# Patient Record
Sex: Female | Born: 1935 | Race: Black or African American | Hispanic: No | Smoking: Never smoker
Health system: Southern US, Community
[De-identification: ages and names within clinical notes are randomized; demographics above are authoritative.]

## PROBLEM LIST (undated history)

## (undated) DIAGNOSIS — K219 Gastro-esophageal reflux disease without esophagitis: Secondary | ICD-10-CM

## (undated) DIAGNOSIS — D649 Anemia, unspecified: Secondary | ICD-10-CM

## (undated) DIAGNOSIS — F329 Major depressive disorder, single episode, unspecified: Secondary | ICD-10-CM

## (undated) DIAGNOSIS — N183 Chronic kidney disease, stage 3 unspecified: Secondary | ICD-10-CM

## (undated) DIAGNOSIS — F32A Depression, unspecified: Secondary | ICD-10-CM

## (undated) DIAGNOSIS — R8281 Pyuria: Secondary | ICD-10-CM

## (undated) DIAGNOSIS — M1990 Unspecified osteoarthritis, unspecified site: Secondary | ICD-10-CM

## (undated) DIAGNOSIS — R609 Edema, unspecified: Secondary | ICD-10-CM

## (undated) HISTORY — DX: Edema, unspecified: R60.9

## (undated) HISTORY — DX: Unspecified osteoarthritis, unspecified site: M19.90

## (undated) HISTORY — DX: Depression, unspecified: F32.A

## (undated) HISTORY — DX: Anemia, unspecified: D64.9

## (undated) HISTORY — DX: Pyuria: R82.81

## (undated) HISTORY — DX: Gastro-esophageal reflux disease without esophagitis: K21.9

## (undated) HISTORY — PX: NO PAST SURGERIES: SHX2092

## (undated) HISTORY — DX: Major depressive disorder, single episode, unspecified: F32.9

---

## 2011-10-11 ENCOUNTER — Inpatient Hospital Stay: Payer: Self-pay | Admitting: Internal Medicine

## 2011-10-11 LAB — CK TOTAL AND CKMB (NOT AT ARMC)
CK, Total: 283 U/L — ABNORMAL HIGH (ref 21–215)
CK-MB: 0.6 ng/mL (ref 0.5–3.6)

## 2011-10-11 LAB — CBC
HCT: 40.2 % (ref 35.0–47.0)
HGB: 12.7 g/dL (ref 12.0–16.0)
MCH: 31.5 pg (ref 26.0–34.0)
MCHC: 31.6 g/dL — ABNORMAL LOW (ref 32.0–36.0)
MCV: 100 fL (ref 80–100)
Platelet: 260 10*3/uL (ref 150–440)
RDW: 15.1 % — ABNORMAL HIGH (ref 11.5–14.5)

## 2011-10-11 LAB — COMPREHENSIVE METABOLIC PANEL
Alkaline Phosphatase: 63 U/L (ref 50–136)
BUN: 89 mg/dL — ABNORMAL HIGH (ref 7–18)
Bilirubin,Total: 1.8 mg/dL — ABNORMAL HIGH (ref 0.2–1.0)
Chloride: 116 mmol/L — ABNORMAL HIGH (ref 98–107)
Co2: 27 mmol/L (ref 21–32)
Creatinine: 1.29 mg/dL (ref 0.60–1.30)
EGFR (African American): 50 — ABNORMAL LOW
EGFR (Non-African Amer.): 42 — ABNORMAL LOW
Osmolality: 337 (ref 275–301)
SGOT(AST): 41 U/L — ABNORMAL HIGH (ref 15–37)
SGPT (ALT): 19 U/L
Sodium: 156 mmol/L — ABNORMAL HIGH (ref 136–145)
Total Protein: 8.5 g/dL — ABNORMAL HIGH (ref 6.4–8.2)

## 2011-10-12 LAB — BASIC METABOLIC PANEL
Anion Gap: 13 (ref 7–16)
BUN: 78 mg/dL — ABNORMAL HIGH (ref 7–18)
Calcium, Total: 9 mg/dL (ref 8.5–10.1)
Chloride: 119 mmol/L — ABNORMAL HIGH (ref 98–107)
Co2: 26 mmol/L (ref 21–32)
EGFR (African American): >60
Osmolality: 338 (ref 275–301)
Potassium: 3.8 mmol/L (ref 3.5–5.1)

## 2011-10-12 LAB — URINALYSIS, COMPLETE
Bilirubin,UR: NEGATIVE
Glucose,UR: NEGATIVE mg/dL (ref 0–75)
Nitrite: NEGATIVE
Ph: 8 (ref 4.5–8.0)
RBC,UR: 13 /HPF (ref 0–5)
Squamous Epithelial: 1
WBC UR: 68 /HPF (ref 0–5)

## 2011-10-12 LAB — CK TOTAL AND CKMB (NOT AT ARMC)
CK, Total: 229 U/L — ABNORMAL HIGH (ref 21–215)
CK-MB: 1 ng/mL (ref 0.5–3.6)

## 2011-10-12 LAB — TROPONIN I: Troponin-I: 0.07 ng/mL — ABNORMAL HIGH

## 2011-10-13 LAB — BASIC METABOLIC PANEL
Anion Gap: 8 (ref 7–16)
BUN: 56 mg/dL — ABNORMAL HIGH (ref 7–18)
Calcium, Total: 8.8 mg/dL (ref 8.5–10.1)
Chloride: 109 mmol/L — ABNORMAL HIGH (ref 98–107)
EGFR (Non-African Amer.): >60
Glucose: 137 mg/dL — ABNORMAL HIGH (ref 65–99)
Osmolality: 306 (ref 275–301)
Potassium: 3.1 mmol/L — ABNORMAL LOW (ref 3.5–5.1)
Sodium: 145 mmol/L (ref 136–145)

## 2011-10-14 LAB — BASIC METABOLIC PANEL
Anion Gap: 9 (ref 7–16)
Calcium, Total: 8.8 mg/dL (ref 8.5–10.1)
Chloride: 109 mmol/L — ABNORMAL HIGH (ref 98–107)
Co2: 29 mmol/L (ref 21–32)
Creatinine: 0.81 mg/dL (ref 0.60–1.30)
Glucose: 105 mg/dL — ABNORMAL HIGH (ref 65–99)
Osmolality: 301 (ref 275–301)

## 2011-10-14 LAB — CBC WITH DIFFERENTIAL/PLATELET
Basophil %: 0.1 %
Eosinophil %: 1.6 %
HCT: 28.2 % — ABNORMAL LOW (ref 35.0–47.0)
Lymphocyte #: 1.9 10*3/uL (ref 1.0–3.6)
MCHC: 32 g/dL (ref 32.0–36.0)
MCV: 100 fL (ref 80–100)
Monocyte #: 1.1 10*3/uL — ABNORMAL HIGH (ref 0.0–0.7)
Monocyte %: 10.7 %
Neutrophil #: 7.1 10*3/uL — ABNORMAL HIGH (ref 1.4–6.5)
Neutrophil %: 69.1 %
RBC: 2.84 10*6/uL — ABNORMAL LOW (ref 3.80–5.20)
WBC: 10.3 10*3/uL (ref 3.6–11.0)

## 2011-10-14 LAB — FERRITIN: Ferritin (ARMC): 415 ng/mL — ABNORMAL HIGH (ref 8–388)

## 2011-10-14 LAB — IRON AND TIBC: Iron Saturation: 41 %

## 2011-10-15 LAB — CBC WITH DIFFERENTIAL/PLATELET
Basophil #: 0 10*3/uL (ref 0.0–0.1)
Basophil %: 0.5 %
HCT: 26.6 % — ABNORMAL LOW (ref 35.0–47.0)
HGB: 8.6 g/dL — ABNORMAL LOW (ref 12.0–16.0)
Lymphocyte %: 15.9 %
MCH: 31.8 pg (ref 26.0–34.0)
MCHC: 32.2 g/dL (ref 32.0–36.0)
MCV: 99 fL (ref 80–100)
Monocyte #: 1.1 10*3/uL — ABNORMAL HIGH (ref 0.0–0.7)
Neutrophil #: 5.8 10*3/uL (ref 1.4–6.5)
Neutrophil %: 69.4 %
RDW: 15.6 % — ABNORMAL HIGH (ref 11.5–14.5)

## 2011-10-16 ENCOUNTER — Encounter: Payer: Self-pay | Admitting: Internal Medicine

## 2011-10-29 ENCOUNTER — Ambulatory Visit: Payer: Self-pay | Admitting: Internal Medicine

## 2011-11-08 ENCOUNTER — Encounter: Payer: Self-pay | Admitting: Internal Medicine

## 2011-11-14 LAB — URINALYSIS, COMPLETE
Bilirubin,UR: NEGATIVE
Leukocyte Esterase: NEGATIVE
Nitrite: NEGATIVE
Protein: NEGATIVE
RBC,UR: <1 /HPF (ref 0–5)
Squamous Epithelial: 1

## 2011-11-16 LAB — BASIC METABOLIC PANEL
BUN: 16 mg/dL (ref 7–18)
Calcium, Total: 8.7 mg/dL (ref 8.5–10.1)
Chloride: 106 mmol/L (ref 98–107)
EGFR (African American): >60
EGFR (Non-African Amer.): >60
Potassium: 3.5 mmol/L (ref 3.5–5.1)
Sodium: 144 mmol/L (ref 136–145)

## 2011-12-08 ENCOUNTER — Encounter: Payer: Self-pay | Admitting: Internal Medicine

## 2012-02-03 ENCOUNTER — Ambulatory Visit: Payer: Self-pay | Admitting: Oncology

## 2012-02-03 LAB — CBC CANCER CENTER
Basophil #: 0 x10 3/mm (ref 0.0–0.1)
Basophil %: 0.7 %
Eosinophil #: 0.2 x10 3/mm (ref 0.0–0.7)
Eosinophil %: 4.4 %
HGB: 9.4 g/dL — ABNORMAL LOW (ref 12.0–16.0)
Lymphocyte #: 1.7 x10 3/mm (ref 1.0–3.6)
MCH: 29.7 pg (ref 26.0–34.0)
MCV: 90 fL (ref 80–100)
Monocyte %: 11.6 %
Neutrophil #: 3 x10 3/mm (ref 1.4–6.5)
Neutrophil %: 53.2 %
RBC: 3.16 10*6/uL — ABNORMAL LOW (ref 3.80–5.20)
RDW: 14.9 % — ABNORMAL HIGH (ref 11.5–14.5)
WBC: 5.6 x10 3/mm (ref 3.6–11.0)

## 2012-02-03 LAB — IRON AND TIBC
Iron Bind.Cap.(Total): 443 ug/dL (ref 250–450)
Iron Saturation: 7 %
Unbound Iron-Bind.Cap.: 415 ug/dL

## 2012-02-03 LAB — LACTATE DEHYDROGENASE: LDH: 211 U/L (ref 84–246)

## 2012-02-03 LAB — FOLATE: Folic Acid: 41.8 ng/mL (ref 3.1–100.0)

## 2012-02-03 LAB — FERRITIN: Ferritin (ARMC): 14 ng/mL (ref 8–388)

## 2012-02-07 ENCOUNTER — Ambulatory Visit: Payer: Self-pay | Admitting: Oncology

## 2012-03-09 ENCOUNTER — Ambulatory Visit: Payer: Self-pay | Admitting: Oncology

## 2012-04-17 ENCOUNTER — Inpatient Hospital Stay: Payer: Self-pay | Admitting: Internal Medicine

## 2012-04-17 LAB — CBC WITH DIFFERENTIAL/PLATELET
Basophil #: 0 10*3/uL (ref 0.0–0.1)
Eosinophil #: 0.1 10*3/uL (ref 0.0–0.7)
HCT: 32.3 % — ABNORMAL LOW (ref 35.0–47.0)
Lymphocyte #: 0.8 10*3/uL — ABNORMAL LOW (ref 1.0–3.6)
Lymphocyte %: 10 %
Monocyte #: 0.7 x10 3/mm (ref 0.2–0.9)
Monocyte %: 9.2 %
Neutrophil #: 6.4 10*3/uL (ref 1.4–6.5)
Neutrophil %: 78.5 %
Platelet: 335 10*3/uL (ref 150–440)
RBC: 3.47 10*6/uL — ABNORMAL LOW (ref 3.80–5.20)
RDW: 17.8 % — ABNORMAL HIGH (ref 11.5–14.5)
WBC: 8.1 10*3/uL (ref 3.6–11.0)

## 2012-04-17 LAB — COMPREHENSIVE METABOLIC PANEL
Albumin: 3.5 g/dL (ref 3.4–5.0)
Alkaline Phosphatase: 95 U/L (ref 50–136)
Calcium, Total: 9.4 mg/dL (ref 8.5–10.1)
Co2: 29 mmol/L (ref 21–32)
Glucose: 116 mg/dL — ABNORMAL HIGH (ref 65–99)
SGPT (ALT): 21 U/L (ref 12–78)
Sodium: 138 mmol/L (ref 136–145)
Total Protein: 8.7 g/dL — ABNORMAL HIGH (ref 6.4–8.2)

## 2012-04-17 LAB — URINALYSIS, COMPLETE
Bilirubin,UR: NEGATIVE
Ketone: NEGATIVE
Protein: NEGATIVE
RBC,UR: 25 /HPF (ref 0–5)
WBC UR: 623 /HPF (ref 0–5)

## 2012-04-17 LAB — PRO B NATRIURETIC PEPTIDE: B-Type Natriuretic Peptide: 423 pg/mL (ref 0–450)

## 2012-04-17 LAB — PROTIME-INR
INR: 1.1
Prothrombin Time: 14.1 secs (ref 11.5–14.7)

## 2012-04-17 LAB — APTT: Activated PTT: 33.7 secs (ref 23.6–35.9)

## 2012-04-18 LAB — BASIC METABOLIC PANEL
Calcium, Total: 8.8 mg/dL (ref 8.5–10.1)
Chloride: 104 mmol/L (ref 98–107)
Creatinine: 0.87 mg/dL (ref 0.60–1.30)
EGFR (Non-African Amer.): 60 — ABNORMAL LOW
Glucose: 100 mg/dL — ABNORMAL HIGH (ref 65–99)
Potassium: 3.4 mmol/L — ABNORMAL LOW (ref 3.5–5.1)
Sodium: 141 mmol/L (ref 136–145)

## 2012-04-18 LAB — CBC WITH DIFFERENTIAL/PLATELET
Basophil #: 0 10*3/uL (ref 0.0–0.1)
Basophil %: 0.5 %
Eosinophil #: 0.3 10*3/uL (ref 0.0–0.7)
HCT: 28 % — ABNORMAL LOW (ref 35.0–47.0)
HGB: 9.3 g/dL — ABNORMAL LOW (ref 12.0–16.0)
Lymphocyte %: 22.5 %
MCH: 30.7 pg (ref 26.0–34.0)
MCHC: 33.4 g/dL (ref 32.0–36.0)
MCV: 92 fL (ref 80–100)
Monocyte %: 10.8 %
RBC: 3.04 10*6/uL — ABNORMAL LOW (ref 3.80–5.20)
WBC: 7.1 10*3/uL (ref 3.6–11.0)

## 2012-04-19 LAB — CBC WITH DIFFERENTIAL/PLATELET
Basophil #: 0 10*3/uL (ref 0.0–0.1)
Basophil %: 0.6 %
Eosinophil #: 0.3 10*3/uL (ref 0.0–0.7)
Eosinophil %: 4.6 %
HCT: 26.1 % — ABNORMAL LOW (ref 35.0–47.0)
HGB: 8.7 g/dL — ABNORMAL LOW (ref 12.0–16.0)
Lymphocyte #: 1.5 10*3/uL (ref 1.0–3.6)
Lymphocyte %: 24.5 %
MCHC: 33.2 g/dL (ref 32.0–36.0)
MCV: 92 fL (ref 80–100)
Monocyte %: 11.8 %
Neutrophil #: 3.5 10*3/uL (ref 1.4–6.5)
RBC: 2.84 10*6/uL — ABNORMAL LOW (ref 3.80–5.20)
WBC: 6 10*3/uL (ref 3.6–11.0)

## 2012-04-19 LAB — BASIC METABOLIC PANEL
Anion Gap: 7 (ref 7–16)
Calcium, Total: 8.2 mg/dL — ABNORMAL LOW (ref 8.5–10.1)
Chloride: 107 mmol/L (ref 98–107)
Creatinine: 0.73 mg/dL (ref 0.60–1.30)
EGFR (African American): >60
EGFR (Non-African Amer.): >60
Glucose: 86 mg/dL (ref 65–99)
Osmolality: 285 (ref 275–301)
Sodium: 143 mmol/L (ref 136–145)

## 2012-04-19 LAB — URINE CULTURE

## 2012-04-20 LAB — CBC WITH DIFFERENTIAL/PLATELET
Basophil #: 0 x10 3/mm 3
Basophil %: 0.4 %
Eosinophil #: 0.2 x10 3/mm 3
Eosinophil %: 2.1 %
HCT: 27.7 % — ABNORMAL LOW
HGB: 9.2 g/dL — ABNORMAL LOW
Lymphocyte %: 19.6 %
Lymphs Abs: 1.5 x10 3/mm 3
MCH: 30.7 pg
MCHC: 33.1 g/dL
MCV: 93 fL
Monocyte #: 0.7 "x10 3/mm "
Monocyte %: 9.3 %
Neutrophil #: 5.1 x10 3/mm 3
Neutrophil %: 68.6 %
Platelet: 328 x10 3/mm 3
RBC: 2.99 X10 6/mm 3 — ABNORMAL LOW
RDW: 17.9 % — ABNORMAL HIGH
WBC: 7.5 x10 3/mm 3

## 2012-04-20 LAB — BASIC METABOLIC PANEL WITH GFR
Anion Gap: 8
BUN: 11 mg/dL
Calcium, Total: 8 mg/dL — ABNORMAL LOW
Chloride: 106 mmol/L
Co2: 29 mmol/L
Creatinine: 0.84 mg/dL
EGFR (African American): >60
EGFR (Non-African Amer.): >60
Glucose: 101 mg/dL — ABNORMAL HIGH
Osmolality: 285
Potassium: 3.4 mmol/L — ABNORMAL LOW
Sodium: 143 mmol/L

## 2012-04-20 LAB — TSH: Thyroid Stimulating Horm: 5.41 u[IU]/mL — ABNORMAL HIGH

## 2012-04-21 LAB — BASIC METABOLIC PANEL
Anion Gap: 5 — ABNORMAL LOW (ref 7–16)
BUN: 14 mg/dL (ref 7–18)
Calcium, Total: 8.2 mg/dL — ABNORMAL LOW (ref 8.5–10.1)
Creatinine: 0.95 mg/dL (ref 0.60–1.30)
EGFR (African American): >60
EGFR (Non-African Amer.): 54 — ABNORMAL LOW
Glucose: 120 mg/dL — ABNORMAL HIGH (ref 65–99)
Osmolality: 287 (ref 275–301)
Potassium: 3.7 mmol/L (ref 3.5–5.1)
Sodium: 143 mmol/L (ref 136–145)

## 2012-04-21 LAB — CBC WITH DIFFERENTIAL/PLATELET
Basophil #: 0 10*3/uL (ref 0.0–0.1)
Eosinophil #: 0.3 10*3/uL (ref 0.0–0.7)
HCT: 27.1 % — ABNORMAL LOW (ref 35.0–47.0)
HGB: 9.1 g/dL — ABNORMAL LOW (ref 12.0–16.0)
Lymphocyte #: 2 10*3/uL (ref 1.0–3.6)
Lymphocyte %: 26.9 %
MCH: 31.1 pg (ref 26.0–34.0)
MCV: 92 fL (ref 80–100)
Monocyte %: 11.2 %
Platelet: 327 10*3/uL (ref 150–440)
RDW: 17.9 % — ABNORMAL HIGH (ref 11.5–14.5)

## 2012-04-23 LAB — CULTURE, BLOOD (SINGLE)

## 2012-06-30 ENCOUNTER — Ambulatory Visit: Payer: Self-pay

## 2013-01-27 ENCOUNTER — Emergency Department: Payer: Self-pay | Admitting: Emergency Medicine

## 2013-01-27 LAB — CBC
HCT: 29.8 % — ABNORMAL LOW (ref 35.0–47.0)
HGB: 10.3 g/dL — ABNORMAL LOW (ref 12.0–16.0)
MCH: 33.4 pg (ref 26.0–34.0)
MCV: 97 fL (ref 80–100)
Platelet: 221 10*3/uL (ref 150–440)
RBC: 3.08 10*6/uL — ABNORMAL LOW (ref 3.80–5.20)
RDW: 14.8 % — ABNORMAL HIGH (ref 11.5–14.5)
WBC: 5.3 10*3/uL (ref 3.6–11.0)

## 2014-01-14 IMAGING — CR DG CHEST 1V PORT
1 series · 1 of 1 positions shown · non-contrast
Comparison: none

REASON FOR EXAM: weakness
COMMENTS:

PROCEDURE:     DXR - DXR PORTABLE CHEST SINGLE VIEW  - October 11, 2011  [DATE]
RESULT:     Comparison: None.

[portable]
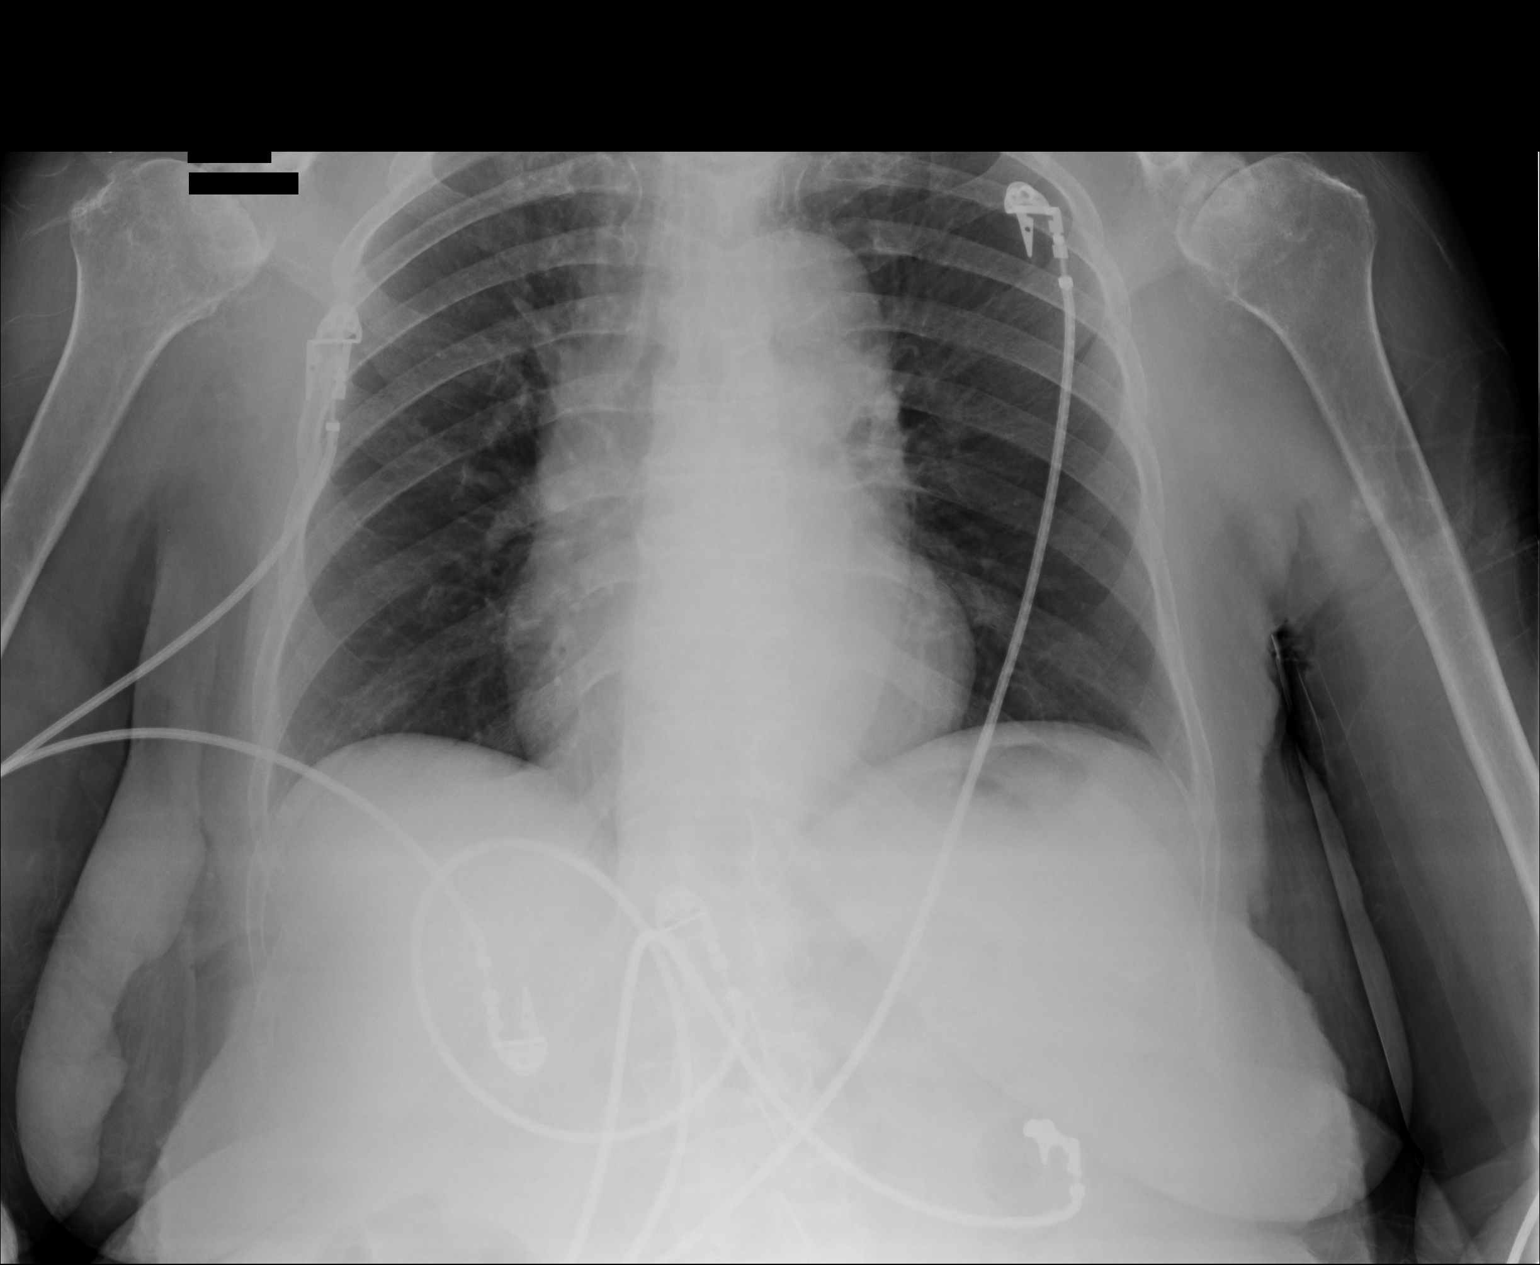

[1 of 1 positions shown; findings below may reference images not displayed]

FINDINGS: The heart is normal in size. The aorta is mildly tortuous. No focal
pulmonary opacities. There are degenerative changes in the shoulders.
IMPRESSION: No acute cardiopulmonary disease.

## 2014-03-23 DIAGNOSIS — M1990 Unspecified osteoarthritis, unspecified site: Secondary | ICD-10-CM | POA: Insufficient documentation

## 2014-03-23 DIAGNOSIS — N183 Chronic kidney disease, stage 3 unspecified: Secondary | ICD-10-CM | POA: Insufficient documentation

## 2014-03-23 DIAGNOSIS — D649 Anemia, unspecified: Secondary | ICD-10-CM | POA: Insufficient documentation

## 2014-11-26 NOTE — Discharge Summary (Signed)
PATIENT NAME:  Heather Zuniga, Heather Zuniga MR#:  [id number] DATE OF BIRTH:  18-May-1923  DATE OF ADMISSION:  04/17/2012 DATE OF DISCHARGE:  Not stated  ADDENDUM:  She was not discharged on 09/12 secondary to not having a bed for her as the family has not contacted the hospital to tell them what facility they wanted. She has been stable. The only change in her medication is that she went to once-a-day Septra instead of twice a day. We will keep the Foley until she can clearly have a bed pan all the time or at least have the urine off of her legs causing some of the decubitus there.   Please note if her hemoglobin does not respond to p.o. iron given her iron deficiency, she should see hematology for further IV iron.   ____________________________ Marya Amsler. Dareen Piano, MD mwa:bjt D: 04/21/2012 07:23:05 ET T: 04/21/2012 07:49:54 ET JOB#: [id number]  cc: Marya Amsler. Dareen Piano, MD, <Dictator> Lauro Regulus MD ELECTRONICALLY SIGNED 04/23/2012 19:53

## 2014-11-26 NOTE — Consult Note (Signed)
PATIENT NAME:  Heather Zuniga, Heather Zuniga MR#:  [id number] DATE OF BIRTH:  03/04/1923  DATE OF CONSULTATION:  04/19/2012  REFERRING PHYSICIAN:   CONSULTING PHYSICIAN:  Verdie Shire, MD/Luane Rochon Ruthe Mannan, NP   ATTENDING PHYSICIAN: Dr. Tressia Miners   PRIMARY CARE PHYSICIAN: Dr. Ouida Sills   REASON FOR CONSULTATION: Anemia, iron deficiency.   HISTORY OF PRESENT ILLNESS: Ms. Wellen is an 81 year old African American female who presented to the Emergency Room for the concern of weakness, falling, and increased frequency with urination. She was diagnosed with a urinary tract infection. History is obtained from the history and physical on admission as well as from patient. No family members are present. It is noted on the patient's medical history compulsive holding history and possible dementia, thus, difficult historian at times. She states that she has been suffering from increased frequency to urinate. Unfortunately, she has not been making it to the bathroom in time, thus she states she is "sitting in the urine". Significant for skin breakdown to buttocks as well as back of her thighs. She denies any abdominal pain. No rectal bleeding. No melena. No nausea. No vomiting. No fevers. No heartburn. She denies history of colonoscopy and upper endoscopy performed in the past. It is noted under past medical history that she has a history of anemia.   PAST MEDICAL HISTORY:  1. History of iron deficiency anemia.  2. Compulsive holding history.  3. Possible dementia.   PAST SURGICAL HISTORY: Unremarkable.   ALLERGIES: None.   HOME MEDICATIONS:  1. Tylenol 500 mg daily. 2. Aspirin 81 mg daily. 3. Colace 100 mg twice a day. 4. Iron 150 mg daily.  5. Lasix 20 mg a day. 6. Prilosec 20 mg a day. 7. One multivitamin a day.   SOCIAL HISTORY: Resides in assisted living facility. No tobacco. No alcohol use.   FAMILY HISTORY: Both parents deceased in their 46's, unknown underlying etiological causes.   REVIEW OF SYSTEMS:  Review of systems obtained from the history and physical again as well as patient. CONSTITUTIONAL: No fevers. Significant for fatigue, weakness. EYES: No blurred vision or double vision, though it is noted that she wears glasses. HEENT: No tinnitus, hearing pain, hearing loss, epistaxis. RESPIRATORY: No coughing, wheezing, hemoptysis. CARDIOVASCULAR: No chest pain, palpitations, syncope. GI: See history of present illness. GU: Significant for increased frequency with urination, incontinence. ENDOCRINE: No thyroid problems, heat or cold intolerance. HEMATOLOGICAL: Known history of iron deficiency anemia. Denies significant for easy bruising and bleeding. SKIN: No acne but significant for lesions, open sores, skin breakdown to the buttocks as well as posterior aspect of thighs. MUSCULOSKELETAL: No history of gout or arthritis. NEUROLOGIC: No history of TIA or CVA, seizure disorder. PSYCH: No depression. No anxiety.   PHYSICAL EXAMINATION:   VITAL SIGNS: Temperature 98.4, pulse 80, respirations 20, blood pressure 145/67, pulse oximetry 94%.   GENERAL: Well developed, well nourished 81 year old African American female in no acute distress noted. Resting comfortably on the left side in her bed.   HEENT: Normocephalic, atraumatic. Pupils equal, round, and reactive light. Conjunctivae clear. Sclerae anicteric.   NECK: Supple. Trachea midline. No lymphadenopathy or thyromegaly.   PULMONARY: Symmetric rise and fall of chest. Clear to auscultation throughout.   CARDIOVASCULAR: Regular rhythm, S1, S2.   ABDOMEN: Soft, nondistended. Bowel sounds in four quadrants. No bruits. No masses.   RECTAL: Evidence of induration, skin breakdown, multiple open lesions noted. Significant for oozing. Noted buttocks as well as posterior aspect of thigh.   MUSCULOSKELETAL: No contractions.  EXTREMITIES: +2 edema.   PSYCH: Alert and oriented x3. Flat affect. Memory loss. Otherwise, no gross neurological deficits.    LABORATORY, DIAGNOSTIC, AND RADIOLOGICAL DATA: Glucose 116 on admission. BUN was elevated at 25. EGFR low at 54, otherwise within normal limits. Today chemistry panel within normal limits except calcium has declined from 9.4 to 8.2. Hepatic panel on admission total protein 8.7, otherwise within normal limits. CBC on admission white count was 8.1, hemoglobin 10.8, hematocrit 32.3, has dropped to 8.7 hemoglobin today with hematocrit of 26.1. PT 14.1 on admission with an INR of 1.1. Urine culture positive for greater than 100,000 CFU/mL of Escherichia coli. Blood cultures revealed no growth in 36 hours. Urinalysis on admission +1 blood, +3 leukocytes, RBCs 25 per high-power field with WBC of 623 per high-power field, 3+ bacteria, epithelial cells 15 per high-power field, white blood cell clumps present, and hyaline casts 8 per low power field.   EKG showed sinus rhythm with possible PACs with aberrant conduction. Last chest x-ray, single view, cannot exclude possible mild congestive heart failure.   IMPRESSION:  1. Urinary tract infection. 2. Generalized weakness. 3. Skin breakdown noted buttocks as well as posterior aspect of thighs.  4. Known history of iron deficiency anemia with noted drop in hemoglobin and hematocrit since admission.   PLAN: The patient's presentation was discussed with Dr. Verdie Shire. Did discuss with the patient the consideration of proceeding forward with endoscopic evaluation for the indication of iron deficiency anemia. The patient declines proceeding forward with a colonoscopy and/or an upper endoscopy for the indication of iron deficiency anemia. The patient wishes to have her condition monitored at this time. Will continue to follow. Otherwise, no further recommendations at this time.   These services provided by Payton Emerald, NP under collaborative agreement with Verdie Shire, MD.   ____________________________ Payton Emerald, NP dsh:drc D: 04/19/2012 17:17:42  ET T: 04/20/2012 08:50:59 ET JOB#: [id number]  cc: Payton Emerald, NP, <Dictator> Payton Emerald MD ELECTRONICALLY SIGNED 04/26/2012 8:20

## 2014-11-26 NOTE — Consult Note (Signed)
Brief Consult Note: Diagnosis: UTI, Falls, Generalized Weakness. Known history of IDA with noted drop in H/H since admission.   Discussed with Attending MD.   Comments: Patient's presentation discussed with Dr. Lutricia Feil.  Discussed with patient of the recommendation to proceed with endoscopic evaluation inclusive of diagnostic colonoscopy and/or EGD for the indication of IDA.  Patient declines proceeding forward in this manner.  Will continue to monitor.  Electronic Signatures: Rodman Key (NP)  (Signed 11-Sep-13 17:19)  Authored: Brief Consult Note   Last Updated: 11-Sep-13 17:19 by Rodman Key (NP)

## 2014-11-26 NOTE — Discharge Summary (Signed)
PATIENT NAME:  JOYEL, Heather Zuniga MR#:  [id number] DATE OF BIRTH:  01-Feb-1923  DATE OF ADMISSION:  04/17/2012 DATE OF DISCHARGE:  04/20/2012   DISCHARGE DIAGNOSES:  1. Encephalopathy secondary to urinary tract infection.  2. Urinary tract infection.  3. Iron deficiency anemia, declining work-up.  4. Edema, fairly marked on admission. 5. Possible early dementia.  6. Decubiti developed at assisted living, is unable to care for herself well there per report.   DISCHARGE MEDICATIONS:  1. Tylenol 650 mg q.4 hours p.r.n.  2. Ferrous sulfate 325 mg daily.  3. Multivitamin daily.  4. Prilosec 20 mg daily.   5. Torsemide 10 mg daily.  6. Potassium chloride 10 mEq daily.  7. Septra DS one p.o. b.i.d. for five days. 8. Zinc oxide cream or other barrier cream on the superficial decubiti on her legs and buttocks twice a day and/or as needed.   HISTORY AND PHYSICAL: Please see detailed history and physical done on admission.   HOSPITAL COURSE: The patient was admitted with confusion, urinary tract infection ultimately growing out Escherichia coli sensitive to the sulfa as well as multiple other things as noted. She had a minimally elevated TSH which was likely sick euthyroid, does not need to be treated. She continued to have anemia with history of this being iron deficient. She got IV iron once at the [facility], unclear if she had gotten her second and/or third dose so this will be given IV today. She will stay on the p.o. iron as well. We will stop her aspirin. GI was consulted and the patient adamantly declined work-up of this. I discussed potential colon cancer, stomach cancer, etc. with her today. She understands and still does not want this done secondary to her age she says. She does understand the risk of death and severe pain, malaise from missing metastatic cancer. She has a bed offer from multiple places today and will decide which one she wants. Will attempt to get her discharged there and work  further with physical therapy as she continues to improve from her urinary tract infection, etc.   TIME SPENT: It took approximately 35 minutes to do discharge tasks today.  ____________________________ Marya Amsler. Dareen Piano, MD mwa:drc D: 04/20/2012 08:00:08 ET T: 04/20/2012 08:20:16 ET JOB#: [id number]  cc: Marya Amsler. Dareen Piano, MD, <Dictator> Lauro Regulus MD ELECTRONICALLY SIGNED 04/20/2012 8:45

## 2014-11-26 NOTE — Consult Note (Signed)
Pt seen. See Dawn Harrison's notes. Pt made it clear that she does not want any endoscopic evaluations for her Fe def anemia. Recommend Fe supplement or blood transfusions prn if hgb falls significantly. Will sign off. THanks.  Electronic Signatures: Lutricia Feil (MD)  (Signed on 11-Sep-13 18:50)  Authored  Last Updated: 11-Sep-13 18:50 by Lutricia Feil (MD)

## 2014-11-26 NOTE — H&P (Signed)
PATIENT NAME:  Heather Zuniga, Heather Zuniga MR#:  [id number] DATE OF BIRTH:  1922/12/20  DATE OF ADMISSION:  04/17/2012  ADMITTING PHYSICIAN: Dr. Enid Baas PRIMARY CARE PHYSICIAN: Dr. Einar Crow  CHIEF COMPLAINT: Weakness, fall, and increased frequency of urination.   HISTORY OF PRESENT ILLNESS: Heather Zuniga is an 81 year old African American female with past medical significant for anemia and possible dementia, who is from an assisted living facility, brought in for the above-mentioned complaints. The patient is a very poor historian and unable to provide much history other than saying that she has been generally weak, has some diarrhea for the last couple of days, and had a fall secondary to weakness. She is found to have increased frequency of urination and labs indicate that she has a urinary tract infection so she is being admitted for the same.   PAST MEDICAL HISTORY: 1. Anemia.  2. Compulsive holding history.  3. Possible dementia.   PAST SURGICAL HISTORY: None.   ALLERGIES TO MEDICATIONS: No known drug allergies.   MEDICATIONS AT HOME:  1. Tylenol 500 mg p.o. daily.  2. Aspirin 81 mg p.o. daily.  3. Colace 100 mg p.o. b.i.d.  4. Iron 150 mg daily. 5. Lasix 20 daily.  6. Prilosec 20 mg p.o. daily. 7. Multivitamin 1 tablet p.o. daily.   SOCIAL HISTORY: Currently from assisted living facility.  No smoking or alcohol use.   FAMILY HISTORY: The patient says both parents died in their 57s. She is not sure what they had.  REVIEW OF SYSTEMS: CONSTITUTIONAL: No fever, fatigue, or weakness. EYES: No blurred vision, double vision, inflammation, or glaucoma. ENT: No tinnitus, ear pain, hearing loss, epistaxis, or discharge.  RESPIRATORY:  No cough, wheezing, hemoptysis, or  chronic obstructive pulmonary disease. CARDIOVASCULAR: No chest pain, orthopnea, edema, arrhythmia, palpitations, or syncope. GI:  Positive for some diarrhea. No nausea, vomiting, abdominal pain, or hematemesis.  Complains of black stools but she is on iron supplements. GU: Increased frequency of urination. No dysuria or hematuria. ENDOCRINE: No polyuria, nocturia, thyroid problems, or heat or cold intolerance. HEMATOLOGIC: No anemia, easy bruising or bleeding. SKIN: No acne, rash, or lesions. MUSCULOSKELETAL: No neck, back, shoulder pain, arthritis, or gout. Generally feels weak. NEUROLOGIC: No cerebrovascular accident, transient ischemic attack, or seizures. PSYCH: No anxiety, insomnia, or depression.   PHYSICAL EXAMINATION:  VITAL SIGNS: Temperature 97.4 degrees Fahrenheit, pulse 84, respirations 18, blood pressure 142/65, pulse oximetry 95 percent on room air.   GENERAL: Well built, well nourished female sitting in bed, not in any acute distress.   HEENT: Normocephalic, atraumatic. Pupils equal, round, reacting to light. Anicteric sclerae. Extraocular movements intact. Oropharynx clear without exudate or masses.  NECK:  Supple.  No thyromegaly, jugular venous distention, or carotid bruits.  No lymphadenopathy.  LUNGS: Moving air bilaterally. No wheeze or crackles.  No use of accessory muscles for breathing.   CARDIOVASCULAR: S1, S2 regular rate and rhythm. No murmurs, rubs, or gallops.   ABDOMEN: Soft, nontender, nondistended. No hepatosplenomegaly. Normal bowel sounds.   EXTREMITIES:  Bilateral 3+ nonpitting edema which is chronic. Unable to palpate any dorsalis pedis pulse.   LYMPH:  No cervical lymphadenopathy. NEUROLOGICAL: Cranial nerves intact. No focal motor or sensory deficit.  PSYCH:  The patient is awake, alert, and oriented times two.  LABORATORY, RADIOLOGICAL, AND DIAGNOSTIC DATA: WBC 8.1, hemoglobin 10.8, hematocrit 32.3, platelet count 335.    Sodium 138, potassium 4, chloride 102, bicarbonate 29, BUN 25, creatinine 1.07, glucose 116, calcium 9.4.  ALT 21, AST 34,  alkaline phosphatase 95, total bili 0.6, albumin 3.5. INR 1.1, PTT 33.7. Lipase 79. BNP is 423. Urinalysis is yellow,  turbid. 3+ leukocyte esterase, 600 WBCs, 3+ bacteria.   Chest x-ray showing mild interstitial vascular prominence. Cannot rule out mild congestive heart failure.   ASSESSMENT AND PLAN:  81 year old female with anemia, dementia, chronic bilateral lower extremity edema admitted after a fall and found to have urinary tract infection. 1. Fall: Likely from weakness and urinary tract infection. We will get physical therapy consult. 2. Urinary tract infection:  Check urine and blood cultures. Started on Levaquin for now.  3. Chronic lower extremity edema: With urinary tract infection we will hold the Lasix at this time. It does not sound like she is overloaded at this time.  4. Anemia: On iron supplements. 5. GI and deep vein thrombosis prophylaxis: She is on Prilosec and TEDs. 6. CODE STATUS: FULL CODE.  TIME SPENT ON ADMISSION: 50 minutes.   ____________________________ Enid Baas, MD rk:bjt D: 04/17/2012 21:30:41 ET T: 04/18/2012 07:12:52 ET JOB#: [id number]  cc: Enid Baas, MD, <Dictator> Marya Amsler. Dareen Piano, MD Enid Baas MD ELECTRONICALLY SIGNED 04/18/2012 13:29

## 2014-12-01 NOTE — H&P (Signed)
PATIENT NAME:  Heather Zuniga, Heather Zuniga MR#:  [id number] DATE OF BIRTH:  01/02/1923  DATE OF ADMISSION:  10/11/2011  PRIMARY CARE PHYSICIAN: None.   CHIEF COMPLAINT: Fall.   HISTORY OF PRESENT ILLNESS:  The patient comes to the Emergency Room after supposedly she fell, stayed on the floor for three days, called the neighbor help and the [facility] Department went to her place. The patient has history of compulsive hoarding where she collects junk in her house. The police department found her covered with urine and feces and in a very filthy condition. She was brought to the Emergency Room where she was found very dehydrated  clinically and sodium was 156. She is being admitted for further evaluation and management. There was no apparent injury or fracture noted. Per EMTs, the patient was found lying on the floor for a few days, unable to eat and drink, had urinated and defecated on herself. The EMS unable to clear trash to get the patient out of the door. Hence, they had to remove the patient via the window. The patient is being admitted for further evaluation and management.   PAST MEDICAL HISTORY: None.   PAST SURGICAL HISTORY: None.   MEDICATIONS:  1. Aspirin.  2. Calcium.  3. Centrum Silver.   ALLERGIES: No known drug allergies.   FAMILY HISTORY: The patient does not know family history about her father and mother.   SOCIAL HISTORY: She lives alone. Has symptoms of compulsive hoarding. She is retired. She denies any smoking or alcohol. The patient does have a foster daughter. Her name is Heather Zuniga.   REVIEW OF SYSTEMS: CONSTITUTIONAL: Positive for fatigue, weakness. EYES: No blurred or double vision. ENT: No tinnitus, ear pain, hearing loss. RESPIRATORY: No cough, wheeze, hemoptysis. CARDIOVASCULAR: No chest pain, orthopnea, or edema. GASTROINTESTINAL: No nausea, vomiting, diarrhea, or abdominal pain. GU: No dysuria or hematuria. ENDOCRINE: No polyuria or nocturia. HEMATOLOGY: No anemia or  easy bruising. SKIN: No acne or rash, appears wrinkled. MUSCULOSKELETAL: Positive for muscle cramps in her calf. No cerebrovascular accident or transient ischemic attack. PSYCH: No anxiety or depression. All other systems reviewed and negative.   PHYSICAL EXAMINATION:  GENERAL: The patient is awake, alert, and oriented x3, not in acute distress.   VITAL SIGNS: Afebrile, pulse 91, blood pressure is 123/58, sats 100% on room air.   HEENT: Atraumatic, normocephalic. Pupils are equal, round, and reactive to light and accommodation. Extraocular movements intact. Oral mucosa is dry with chapped lips.   NECK: Supple. No JVD. No carotid bruit.   LUNGS: Clear to auscultation bilaterally. No rales, rhonchi, respiratory distress, or labored breathing.   CARDIOVASCULAR: Tachycardia present. Both the heart sounds are normal. Rhythm is regular, rate is tachycardic. No murmur heard. PMI is not lateralized. Chest nontender.   EXTREMITIES: Feeble pedal pulses, good femoral pulses. No lower extremity edema.   ABDOMEN: Soft, benign, and nontender. No organomegaly. Positive bowel sounds.   NEUROLOGIC: Grossly intact cranial nerves II through XII. The patient is globally weak. No motor or sensory deficit. The patient is an extremely dehydrated and very weak.   SKIN: Warm and dry.   LABORATORY, DIAGNOSTIC, AND RADIOLOGICAL DATA: EKG shows sinus tachycardia. Chest x-ray: No acute cardiopulmonary disease. CK total is 283, MB 0.6. Troponin is 0.08. White count 14.1, hemoglobin and hematocrit 12.7 and 40.2, platelet count 260. Glucose 117, BUN 89, creatinine 1.29, sodium 156, potassium 4.4, chloride 116, bicarbonate 27, calcium 10, bilirubin total 1.8. LFTs: SGPT 19, SGOT 41, total protein 8.5, albumin  3.7.   ASSESSMENT: 81 year old Heather Zuniga with:  1. Severe dehydration due to poor p.o. intake for 3 to 4 days.  2. Hyponatremia/hypochloremia due to #1.  3. Falls at home. The patient thinks she was down on the  floor since Saturday which is three days. Denies any injury other than muscle cramps in her calf muscles. [employer] was informed by neighbors. Given her collection of junk in the house, the patient was removed from the window. 4. The patient has habit of hoarding, likely a compulsive obsession for many years.  5. Elevated troponin without chest pain and negative EKG.   PLAN:  1. Admit the patient to medical floor.  2. Regular diet.  3. Will start the patient on D5 water at 125 an hour. Follow-up Met B.  4. Continue aspirin daily.  5. We will start the patient on some multivitamins.  6. Heparin for deep vein thrombosis prophylaxis.  7. Cycle cardiac enzymes x3.  8. Care management for discharge planning. The patient needs to have a safe discharge plan given her symptoms of hoarding, living alone and falls at home. DSS needs to be notified as well.  9. We will have physical therapy see the patient as well for gait ambulation and training.  10. Further work-up according to the patient's clinical course. The hospital admission plan was discussed with the patient and her daughter, who, per the patient, is a foster daughter and was agreeable to it. The patient is a FULL CODE for now.   TIME SPENT: 50 minutes.   ____________________________ Heather Rochester Posey Pronto, MD sap:ap D: 10/11/2011 23:29:10 ET T: 10/12/2011 07:00:15 ET JOB#: [id number]  cc: Heather Zuniga A. Posey Pronto, MD, <Dictator> Heather Basset MD ELECTRONICALLY SIGNED 10/13/2011 6:26

## 2014-12-01 NOTE — Discharge Summary (Signed)
PATIENT NAME:  Heather Zuniga, Heather Zuniga MR#:  [id number] DATE OF BIRTH:  10-29-1922  DATE OF ADMISSION:  10/11/2011 DATE OF DISCHARGE:  10/15/2011   ADMITTING DIAGNOSIS: Status post fall.   DISCHARGE DIAGNOSES: 1. Status post fall due to cluttering in the house.  2. Severe dehydration due to lack of p.o. intake due to fall and inability to get up.  3. Severe hypernatremia, hyperchloremia due to dehydration, now resolved with IV fluids.  4. Urinary tract infection, on p.o. Cipro.  5. Constipation, now better.  6. Anemia likely due to anemia of chronic disease. May consider outpatient colonoscopy.  7. Slightly elevated troponin in setting of a fall without any EKG changes to suggest ischemia.  8. Compulsive hoarding behavior.  9. Likely early dementia, type unknown.   PERTINENT LABORATORY EVALUATIONS: Troponin 0.08. WBC 14.1, hemoglobin 12.7, platelet count 260. BMP glucose 117, BUN 89, creatinine 1.29, sodium 156, potassium 4.4, chloride 116, bilirubin total 1.8, alkaline phosphatase 63, ALT 19, AST 41.   EKG sinus tachycardia, nonspecific ST-T wave changes. Chest x-ray showed no acute cardiopulmonary processes. Urinalysis showed 3+ bacteria, leukocyte esterase 1+. B12 level was 1224. Iron level was 91. Iron binding 220. Ferritin was 415. Chest x-ray showed no acute cardiopulmonary processes.   HOSPITAL COURSE: Please see history and physical done by the admitting physician. The patient is an 81 year old African American female with no previous medical history who lives in her home alone but has been hoarding newspapers and other things over the past few years. Apparently she fell and could not get up for three days. Her neighbor's daughter called EMS when she went to give her the newspaper that was left outside. When EMS arrived, they noticed her on the floor awake. They noticed lots of cluttering in the house. She was brought to the ED and noted to have a sodium of 156. Creatinine was slightly elevated.  She was very dehydrated. She was admitted for further evaluation and treatment. The patient was treated with IV fluids with resolution of her hypernatremia and her acute renal failure. The patient also was noted to have a urinary tract infection which was treated with antibiotics. She is felt to be very deconditioned and needs further rehab/nursing home placement which is being arranged. The patient also had a normal hemoglobin on admission, however, with hydration her hemoglobin did drop but likely as a result of her being very dehydrated on presentation. The patient's iron, ferritin, and B12 levels were all normal. She is doing much better and is stable for discharge to skilled nursing facility.   DISCHARGE MEDICATIONS:  1. Aspirin 81 one tab p.o. daily.  2. Multivitamin 1 tab p.o. daily.  3. Cipro 500 p.o. q.12 hours x2 days. 4. Colace 100 p.o. b.i.d.  5. Senna 1 to 2 tabs p.o. q.8 p.r.n. constipation.   DIET: Regular.   ACTIVITY: As tolerated.   REFERRAL: Rehab/skilled nursing facility with PT and OT.   TIMEFRAME FOR FOLLOW-UP: [facility] MD next available in 1 to 2 weeks as a new patient.   TIME SPENT: 35 minutes.  ____________________________ Lacie Scotts Allena Katz, MD shp:drc D: 10/15/2011 11:33:48 ET T: 10/15/2011 11:41:36 ET JOB#: [id number]  cc: Mckinsey Keagle H. Allena Katz, MD, <Dictator> Charise Carwin MD ELECTRONICALLY SIGNED 10/23/2011 13:12

## 2015-03-27 ENCOUNTER — Other Ambulatory Visit: Payer: Self-pay | Admitting: Family Medicine

## 2015-05-06 ENCOUNTER — Other Ambulatory Visit: Payer: Self-pay | Admitting: Family Medicine

## 2015-05-06 NOTE — Telephone Encounter (Signed)
Patient is requesting refill on Latanoprost 0.005%. Patient last seen on 11/25/2014 by Amy.

## 2015-06-09 ENCOUNTER — Ambulatory Visit (INDEPENDENT_AMBULATORY_CARE_PROVIDER_SITE_OTHER): Payer: Medicare Other | Admitting: Family Medicine

## 2015-06-09 ENCOUNTER — Telehealth: Payer: Self-pay | Admitting: Family Medicine

## 2015-06-09 ENCOUNTER — Ambulatory Visit: Payer: Self-pay | Admitting: Family Medicine

## 2015-06-09 ENCOUNTER — Encounter: Payer: Self-pay | Admitting: Family Medicine

## 2015-06-09 ENCOUNTER — Other Ambulatory Visit: Payer: Self-pay | Admitting: Family Medicine

## 2015-06-09 VITALS — BP 111/76 | HR 73 | Temp 97.8°F | Resp 16 | Ht 60.0 in | Wt 213.4 lb

## 2015-06-09 DIAGNOSIS — M1990 Unspecified osteoarthritis, unspecified site: Secondary | ICD-10-CM | POA: Diagnosis not present

## 2015-06-09 DIAGNOSIS — L97909 Non-pressure chronic ulcer of unspecified part of unspecified lower leg with unspecified severity: Secondary | ICD-10-CM

## 2015-06-09 DIAGNOSIS — K219 Gastro-esophageal reflux disease without esophagitis: Secondary | ICD-10-CM | POA: Diagnosis not present

## 2015-06-09 DIAGNOSIS — I83029 Varicose veins of left lower extremity with ulcer of unspecified site: Secondary | ICD-10-CM | POA: Diagnosis not present

## 2015-06-09 DIAGNOSIS — R609 Edema, unspecified: Secondary | ICD-10-CM

## 2015-06-09 DIAGNOSIS — E669 Obesity, unspecified: Secondary | ICD-10-CM | POA: Diagnosis not present

## 2015-06-09 DIAGNOSIS — I83009 Varicose veins of unspecified lower extremity with ulcer of unspecified site: Secondary | ICD-10-CM

## 2015-06-09 DIAGNOSIS — L97929 Non-pressure chronic ulcer of unspecified part of left lower leg with unspecified severity: Principal | ICD-10-CM

## 2015-06-09 MED ORDER — DOXYCYCLINE HYCLATE 100 MG PO TABS: 100.0000 mg | ORAL_TABLET | Freq: Two times a day (BID) | ORAL | Status: DC

## 2015-06-09 NOTE — Telephone Encounter (Signed)
Called [facility] to f/u on scheduling wound clinic appt for Ms. Heather Zuniga. Shelia from [facility] called but was told she had to schedule with Pam. The 1st available appt is Tues 11/8. I stressed the importance of scheduling this appt as Dr. Juanetta Gosling wanted something sooner. Spoke with Pam who was going to find out who could schedule this appt.

## 2015-06-09 NOTE — Patient Instructions (Signed)
To get flu shot next week at [facility].  To get appt. To wound care ASAP.  Cont. All other current meds.Marland Kitchen

## 2015-06-09 NOTE — Progress Notes (Signed)
Name: Heather Zuniga   MRN: [id number]    DOB: April 12, 1923   Date:06/09/2015       Progress Note  Subjective  Chief Complaint  Chief Complaint  Patient presents with  . Leg Injury    HPI Resident at [facility].  Wearing dressings on lower legs x many months.  C/o L. Leg swelling and hurting more for past few days.  Leg oozing also.  No fever.  No problem-specific assessment & plan notes found for this encounter.   Past Medical History  Diagnosis Date  . GERD (gastroesophageal reflux disease)   . Anemia   . Arthritis   . Pyuria   . Edema   . Depression     Social History  Substance Use Topics  . Smoking status: Never Smoker   . Smokeless tobacco: Never Used  . Alcohol Use: No     Current outpatient prescriptions:  .  Acetaminophen (TYLENOL ARTHRITIS EXT RELIEF PO), Take 2 tablets by mouth 2 (two) times daily., Disp: , Rfl:  .  ferrous sulfate 325 (65 FE) MG tablet, TAKE 1 TABLET BY MOUTH ONCE DAILY, Disp: 30 tablet, Rfl: 11 .  omeprazole (PRILOSEC) 20 MG capsule, Take 20 mg by mouth daily. , Disp: , Rfl:  .  Polyethyl Glycol-Propyl Glycol (SYSTANE) 0.4-0.3 % SOLN, Apply 1 drop to eye 2 (two) times daily., Disp: , Rfl:  .  potassium chloride (K-DUR,KLOR-CON) 10 MEQ tablet, Take 10 mEq by mouth daily. , Disp: , Rfl:  .  spironolactone (ALDACTONE) 25 MG tablet, Take 25 mg by mouth daily. , Disp: , Rfl:  .  torsemide (DEMADEX) 20 MG tablet, , Disp: , Rfl:  .  aspirin EC 81 MG tablet, Take by mouth., Disp: , Rfl:   No Known Allergies  Review of Systems  Constitutional: Negative for fever, chills, weight loss and malaise/fatigue.  Respiratory: Negative for cough, shortness of breath and wheezing.   Cardiovascular: Positive for leg swelling (Bilateral leg swelling with L>R.  R leg wrapped.  L. leg oozing.and painful). Negative for chest pain and palpitations.  Gastrointestinal: Negative for heartburn, abdominal pain and blood in stool.  Genitourinary: Negative for dysuria,  urgency and frequency.  Musculoskeletal: Negative for myalgias and joint pain.  Skin: Negative for rash.  Neurological: Negative for weakness.      Objective  Filed Vitals:   06/09/15 0829  BP: 111/76  Pulse: 73  Temp: 97.8 F (36.6 C)  Resp: 16  Height: 5' (1.524 m)  Weight: 213 lb 6.4 oz (96.798 kg)     Physical Exam  Constitutional: She is well-developed, well-nourished, and in no distress. No distress.  HENT:  Head: Normocephalic and atraumatic.  Musculoskeletal: She exhibits edema.  L lower leg wrapped with sterile 4x4s and kling and an ACEW wrap until Wound care can start care.  Skin:  Raw area post L. Lower leg with clear oozing serum.  Tender to palpation.  Some redness and warmth.  Leg with 3 + edema.      No results found for this or any previous visit (from the past 2160 hour(s)).   Assessment & Plan  1. Stasis leg ulcer, left (HCC)  - doxycycline (VIBRA-TABS) 100 MG tablet; Take 1 tablet (100 mg total) by mouth 2 (two) times daily. TAke with food  Dispense: 20 tablet; Refill: 0 - Ambulatory referral to Wound Clinic  2. Gastroesophageal reflux disease without esophagitis   3. Obesity   4. Arthritis   5. Edema, unspecified type

## 2015-06-11 ENCOUNTER — Other Ambulatory Visit: Payer: Self-pay | Admitting: Family Medicine

## 2015-06-11 ENCOUNTER — Telehealth: Payer: Self-pay | Admitting: *Deleted

## 2015-06-11 DIAGNOSIS — L97909 Non-pressure chronic ulcer of unspecified part of unspecified lower leg with unspecified severity: Principal | ICD-10-CM

## 2015-06-11 DIAGNOSIS — I83009 Varicose veins of unspecified lower extremity with ulcer of unspecified site: Secondary | ICD-10-CM

## 2015-06-11 DIAGNOSIS — E669 Obesity, unspecified: Secondary | ICD-10-CM

## 2015-06-11 DIAGNOSIS — L97929 Non-pressure chronic ulcer of unspecified part of left lower leg with unspecified severity: Secondary | ICD-10-CM

## 2015-06-11 NOTE — Telephone Encounter (Signed)
Yes, order Home Health Nurse evaluation for dressing changes daily to legs with early ulcer L calf.  She is scheduled for wound center next week.

## 2015-06-11 NOTE — Telephone Encounter (Signed)
Daphne from [facility] called and they are requesting an order for home health. Per Director at their facility they are not able to change wound dressing as complex as the patient's. They report that it is oozing and skin falling off. Please advise.

## 2015-06-13 NOTE — Telephone Encounter (Signed)
Called [facility] to f/u on order for hh nurse eval. Spoke to BJ who says she has been admitted into their services.

## 2015-06-17 ENCOUNTER — Encounter: Payer: Medicare Other | Attending: Surgery | Admitting: Surgery

## 2015-06-17 ENCOUNTER — Ambulatory Visit: Payer: Self-pay | Admitting: Family Medicine

## 2015-06-17 DIAGNOSIS — M1990 Unspecified osteoarthritis, unspecified site: Secondary | ICD-10-CM | POA: Diagnosis not present

## 2015-06-17 DIAGNOSIS — I509 Heart failure, unspecified: Secondary | ICD-10-CM | POA: Insufficient documentation

## 2015-06-17 DIAGNOSIS — K219 Gastro-esophageal reflux disease without esophagitis: Secondary | ICD-10-CM | POA: Diagnosis not present

## 2015-06-17 DIAGNOSIS — N183 Chronic kidney disease, stage 3 (moderate): Secondary | ICD-10-CM | POA: Insufficient documentation

## 2015-06-17 DIAGNOSIS — F329 Major depressive disorder, single episode, unspecified: Secondary | ICD-10-CM | POA: Diagnosis not present

## 2015-06-17 DIAGNOSIS — D649 Anemia, unspecified: Secondary | ICD-10-CM | POA: Insufficient documentation

## 2015-06-17 DIAGNOSIS — I890 Lymphedema, not elsewhere classified: Secondary | ICD-10-CM | POA: Insufficient documentation

## 2015-06-17 DIAGNOSIS — H5441 Blindness, right eye, normal vision left eye: Secondary | ICD-10-CM | POA: Diagnosis not present

## 2015-06-17 DIAGNOSIS — L97222 Non-pressure chronic ulcer of left calf with fat layer exposed: Secondary | ICD-10-CM | POA: Insufficient documentation

## 2015-06-18 NOTE — Progress Notes (Signed)
LYNZE, REDDY ([id number]) Visit Report for 06/17/2015 Chief Complaint Document Details Patient Name: Heather Zuniga, Heather Zuniga Date of Service: 06/17/2015 8:00 AM Medical Record Number: [id number] Patient Account Number: [id number] Date of Birth/Sex: 1923/02/02 (81 y.o. Female) Treating RN: Curtis Sites Primary Care Physician: Bjorn Pippin Other Clinician: Referring Physician: Fidel Levy Treating Physician/Extender: Rudene Re in Treatment: 0 Information Obtained from: Patient Chief Complaint Patient seen for complaints of Non-Healing Wound. this 81 year old patient has had bilateral lower extremity swelling for over 3 years and now has had an open ulceration on the posterior part of the left leg for several months. Electronic Signature(s) Signed: 06/17/2015 9:27:06 AM By: Evlyn Kanner MD, FACS Entered By: Evlyn Kanner on 06/17/2015 09:27:06 BREUNNA, NORDMANN ([id number]) -------------------------------------------------------------------------------- HPI Details Patient Name: Heather Zuniga Date of Service: 06/17/2015 8:00 AM Medical Record Number: [id number] Patient Account Number: [id number] Date of Birth/Sex: 1922-11-20 (81 y.o. Female) Treating RN: Curtis Sites Primary Care Physician: Bjorn Pippin Other Clinician: Referring Physician: Fidel Levy Treating Physician/Extender: Rudene Re in Treatment: 0 History of Present Illness Location: ulcerated area on the left posterior leg. She also has bilateral lower extremity swelling Quality: Patient reports experiencing a dull pain to affected area(s). Severity: Patient states wound are getting worse. Duration: Patient has had the wound for > 3 months prior to seeking treatment at the wound center Timing: Pain in wound is Intermittent (comes and goes Context: The wound appeared gradually over time Modifying Factors: Other treatment(s) tried include: she has had doxycycline and a compression ace wrap applied Associated  Signs and Symptoms: Patient reports having increase swelling. HPI Description: 81 year old patient sent by her PCP Venora Maples for a stasis ulcer on her left lower leg which she's had for many months. As indicated to him recently with leg swelling and oozing. A past medical history significant for GERD, chronic kidney disease stage III,anemia, arthritis, pyuria, edema and depression. She has never been a smoker. On clinical examination he found that she had a left lower leg ulceration with oozing and was tender to palpation with some redness and +3 edema. He put her on doxycycline and referred her to the wound clinic. Electronic Signature(s) Signed: 06/17/2015 9:28:08 AM By: Evlyn Kanner MD, FACS Previous Signature: 06/17/2015 8:55:04 AM Version By: Evlyn Kanner MD, FACS Previous Signature: 06/17/2015 8:42:55 AM Version By: Evlyn Kanner MD, FACS Previous Signature: 06/17/2015 8:36:32 AM Version By: Evlyn Kanner MD, FACS Entered By: Evlyn Kanner on 06/17/2015 09:28:07 Heather Zuniga, Heather Zuniga ([id number]) -------------------------------------------------------------------------------- Physical Exam Details Patient Name: Heather Zuniga Date of Service: 06/17/2015 8:00 AM Medical Record Number: [id number] Patient Account Number: [id number] Date of Birth/Sex: April 07, 1923 (81 y.o. Female) Treating RN: Curtis Sites Primary Care Physician: Bjorn Pippin Other Clinician: Referring Physician: Fidel Levy Treating Physician/Extender: Rudene Re in Treatment: 0 Constitutional . Pulse regular. Respirations normal and unlabored. Afebrile. . Eyes Nonicteric. Reactive to light. Ears, Nose, Mouth, and Throat Lips, teeth, and gums WNL.Marland Kitchen Moist mucosa without lesions . Neck supple and nontender. No palpable supraclavicular or cervical adenopathy. Normal sized without goiter. Respiratory WNL. No retractions.. Cardiovascular Pedal Pulses WNL. ABI on the left was 1.03 on the right was 0.92. she has  bilateral stage II lymphedema and there is a large weeping open ulcer on the left posterior leg. Gastrointestinal (GI) Abdomen without masses or tenderness.. No liver or spleen enlargement or tenderness.. Lymphatic No adneopathy. No adenopathy. No adenopathy. Musculoskeletal Adexa without tenderness or enlargement.. Digits and nails w/o clubbing, cyanosis, infection, petechiae, ischemia, or inflammatory conditions.. Integumentary (Hair,  Skin) No suspicious lesions. No crepitus or fluctuance. No peri-wound warmth or erythema. No masses.Marland Kitchen Psychiatric Judgement and insight Intact.. No evidence of depression, anxiety, or agitation.. Notes The patient has a large ulcerated area on the left lower extremity in the region of the calf with subcutaneous tissue exposed. there is no surrounding cellulitis but has significant swelling. Electronic Signature(s) Signed: 06/17/2015 9:29:35 AM By: Evlyn Kanner MD, FACS Entered By: Evlyn Kanner on 06/17/2015 09:29:34 Heather Zuniga, Heather Zuniga ([id number]) -------------------------------------------------------------------------------- Physician Orders Details Patient Name: Heather Zuniga Date of Service: 06/17/2015 8:00 AM Medical Record Number: [id number] Patient Account Number: [id number] Date of Birth/Sex: Apr 24, 1923 (81 y.o. Female) Treating RN: Curtis Sites Primary Care Physician: Bjorn Pippin Other Clinician: Referring Physician: Fidel Levy Treating Physician/Extender: Rudene Re in Treatment: 0 Verbal / Phone Orders: Yes Clinician: Curtis Sites Read Back and Verified: Yes Diagnosis Coding Wound Cleansing Wound #1 Left,Posterior Lower Leg o Clean wound with Normal Saline. Anesthetic Wound #1 Left,Posterior Lower Leg o Topical Lidocaine 4% cream applied to wound bed prior to debridement Primary Wound Dressing Wound #1 Left,Posterior Lower Leg o Aquacel Ag Secondary Dressing Wound #1 Left,Posterior Lower Leg o ABD  pad Dressing Change Frequency Wound #1 Left,Posterior Lower Leg o Change dressing every week - or as needed Follow-up Appointments Wound #1 Left,Posterior Lower Leg o Return Appointment in 1 week. Edema Control Wound #1 Left,Posterior Lower Leg o 3 Layer Compression System - Left Lower Extremity - Please anchor with unna at the top Home Health Wound #1 Left,Posterior Lower Leg o Continue Home Health Visits - HHRN may change wrap if wrap slips or is uncomfortable o Home Health Nurse may visit PRN to address patientos wound care needs. o FACE TO FACE ENCOUNTER: MEDICARE and MEDICAID PATIENTS: I certify that this patient is under my care and that I had a face-to-face encounter that meets the physician face-to-face encounter requirements with this patient on this date. The encounter with the patient was in [place], [place] ([id number]) whole or in part for the following MEDICAL CONDITION: (primary reason for Home Healthcare) MEDICAL NECESSITY: I certify, that based on my findings, NURSING services are a medically necessary home health service. HOME BOUND STATUS: I certify that my clinical findings support that this patient is homebound (i.e., Due to illness or injury, pt requires aid of supportive devices such as crutches, cane, wheelchairs, walkers, the use of special transportation or the assistance of another person to leave their place of residence. There is a normal inability to leave the home and doing so requires considerable and taxing effort. Other absences are for medical reasons / religious services and are infrequent or of short duration when for other reasons). o If current dressing causes regression in wound condition, may D/C ordered dressing product/s and apply Normal Saline Moist Dressing daily until next Wound Healing Center / Other MD appointment. Notify Wound Healing Center of regression in wound condition at [phone]. o Please direct any NON-WOUND  related issues/requests for orders to patient's Primary Care Physician Services and Therapies o Venous Studies -Bilateral oooo Electronic Signature(s) Signed: 06/17/2015 4:34:10 PM By: Evlyn Kanner MD, FACS Signed: 06/17/2015 5:19:50 PM By: Curtis Sites Entered By: Curtis Sites on 06/17/2015 09:22:29 Heather Zuniga ([id number]) -------------------------------------------------------------------------------- Problem List Details Patient Name: Heather Zuniga Date of Service: 06/17/2015 8:00 AM Medical Record Number: [id number] Patient Account Number: [id number] Date of Birth/Sex: 10-10-1922 (81 y.o. Female) Treating RN: Curtis Sites Primary Care Physician: Bjorn Pippin Other Clinician: Referring Physician: Fidel Levy Treating Physician/Extender: Rudene Re  in Treatment: 0 Active Problems ICD-10 Encounter Code Description Active Date Diagnosis I89.0 Lymphedema, not elsewhere classified 06/17/2015 Yes L97.222 Non-pressure chronic ulcer of left calf with fat layer 06/17/2015 Yes exposed E66.01 Morbid (severe) obesity due to excess calories 06/17/2015 Yes N18.3 Chronic kidney disease, stage 3 (moderate) 06/17/2015 Yes Inactive Problems Resolved Problems Electronic Signature(s) Signed: 06/17/2015 9:26:28 AM By: Evlyn Kanner MD, FACS Entered By: Evlyn Kanner on 06/17/2015 59:56:38 Heather Zuniga ([id number]) -------------------------------------------------------------------------------- Progress Note Details Patient Name: Heather Zuniga Date of Service: 06/17/2015 8:00 AM Medical Record Number: [id number] Patient Account Number: [id number] Date of Birth/Sex: [birth year] (81 y.o. Female) Treating RN: Curtis Sites Primary Care Physician: Bjorn Pippin Other Clinician: Referring Physician: Fidel Levy Treating Physician/Extender: Rudene Re in Treatment: 0 Subjective Chief Complaint Information obtained from Patient Patient seen for complaints of  Non-Healing Wound. this 81 year old patient has had bilateral lower extremity swelling for over 3 years and now has had an open ulceration on the posterior part of the left leg for several months. History of Present Illness (HPI) The following HPI elements were documented for the patient's wound: Location: ulcerated area on the left posterior leg. She also has bilateral lower extremity swelling Quality: Patient reports experiencing a dull pain to affected area(s). Severity: Patient states wound are getting worse. Duration: Patient has had the wound for > 3 months prior to seeking treatment at the wound center Timing: Pain in wound is Intermittent (comes and goes Context: The wound appeared gradually over time Modifying Factors: Other treatment(s) tried include: she has had doxycycline and a compression ace wrap applied Associated Signs and Symptoms: Patient reports having increase swelling. 81 year old patient sent by her PCP Venora Maples for a stasis ulcer on her left lower leg which she's had for many months. As indicated to him recently with leg swelling and oozing. A past medical history significant for GERD, chronic kidney disease stage III,anemia, arthritis, pyuria, edema and depression. She has never been a smoker. On clinical examination he found that she had a left lower leg ulceration with oozing and was tender to palpation with some redness and +3 edema. He put her on doxycycline and referred her to the wound clinic. Wound History Patient presents with 1 open wound that has been present for approximately 2-3 weeks. Patient has been treating wound in the following manner: gauze and ace wrap. Laboratory tests have not been performed in the last month. Patient reportedly has not tested positive for an antibiotic resistant organism. Patient reportedly has not tested positive for osteomyelitis. Patient reportedly has not had testing performed to evaluate circulation in the legs.  Patient experiences the following problems associated with their wounds: swelling. Patient History Information obtained from Patient, Caregiver. Allergies Dilks, Arnell ([id number]) No Known Allergies Family History No family history of Cancer, Diabetes, Heart Disease, Hereditary Spherocytosis, Hypertension, Kidney Disease, Lung Disease, Seizures, Stroke, Thyroid Problems, Tuberculosis. Social History Never smoker, Marital Status - Divorced, Alcohol Use - Never, Drug Use - No History, Caffeine Use - Never. Medical History Eyes Patient has history of Cataracts, Glaucoma Hematologic/Lymphatic Patient has history of Anemia - iron Cardiovascular Patient has history of Congestive Heart Failure Musculoskeletal Patient has history of Osteoarthritis Oncologic Denies history of Received Chemotherapy, Received Radiation Psychiatric Denies history of Anorexia/bulimia, Confinement Anxiety Hospitalization/Surgery History - 05/09/2012, [facility], wound on back. 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 Eyes The patient has no  complaints or symptoms. Ear/Nose/Mouth/Throat The patient has no complaints or symptoms. Hematologic/Lymphatic The patient has no complaints or symptoms. Cardiovascular Complains or has symptoms of LE edema. Gastrointestinal The patient has no complaints or symptoms. Endocrine The patient has no complaints or symptoms. Genitourinary Complains or has symptoms of Incontinence/dribbling. Immunological The patient has no complaints or symptoms. Heather Zuniga, Heather Zuniga ([id number]) Integumentary (Skin) Complains or has symptoms of Wounds - wound on lower back. Neurologic The patient has no complaints or symptoms. Oncologic The patient has no complaints or symptoms. Psychiatric The patient has no complaints or  symptoms. Medications Tylenol Arthritis Pain 650 mg tablet,extended release oral 2 2 tablet extended release oral Systane Ultra 0.4 %-0.3 % eye drops ophthalmic 1 1 drops ophthalmic ferrous sulfate 325 mg (65 mg iron) tablet oral 1 1 tablet oral torsemide 20 mg tablet oral 1 1 tablet oral Xalatan 0.005 % eye drops ophthalmic 1 1 drops ophthalmic Therems tablet oral 1 1 tablet oral potassium chloride ER 10 mEq tablet,extended release oral 1 1 tablet extended release oral spironolactone 25 mg tablet oral 1 1 tablet oral Prilosec OTC 20 mg tablet,delayed release oral 1 1 tablet,delayed release (DR/EC) oral doxycycline hyclate 100 mg tablet oral 1 1 tablet oral Objective Constitutional Pulse regular. Respirations normal and unlabored. Afebrile. Vitals Time Taken: 8:19 AM, Height: 60 in, Source: Stated, Weight: 213 lbs, Source: Stated, BMI: 41.6, Temperature: 97.7 F, Pulse: 63 bpm, Respiratory Rate: 16 breaths/min, Blood Pressure: 152/70 mmHg. Eyes Nonicteric. Reactive to light. Ears, Nose, Mouth, and Throat Lips, teeth, and gums WNL.Marland Kitchen Moist mucosa without lesions . Neck supple and nontender. No palpable supraclavicular or cervical adenopathy. Normal sized without goiter. Respiratory WNL. No retractions.Marland Kitchen CARLENE, BICKLEY ([id number]) Cardiovascular Pedal Pulses WNL. ABI on the left was 1.03 on the right was 0.92. she has bilateral stage II lymphedema and there is a large weeping open ulcer on the left posterior leg. Gastrointestinal (GI) Abdomen without masses or tenderness.. No liver or spleen enlargement or tenderness.. Lymphatic No adneopathy. No adenopathy. No adenopathy. Musculoskeletal Adexa without tenderness or enlargement.. Digits and nails w/o clubbing, cyanosis, infection, petechiae, ischemia, or inflammatory conditions.Marland Kitchen Psychiatric Judgement and insight Intact.. No evidence of depression, anxiety, or agitation.. General Notes: The patient has a large ulcerated area on the  left lower extremity in the region of the calf with subcutaneous tissue exposed. there is no surrounding cellulitis but has significant swelling. Integumentary (Hair, Skin) No suspicious lesions. No crepitus or fluctuance. No peri-wound warmth or erythema. No masses.. Wound #1 status is Open. Original cause of wound was Not Known. The wound is located on the Left,Posterior Lower Leg. The wound measures 5cm length x 9cm width x 0.1cm depth; 35.343cm^2 area and 3.534cm^3 volume. The wound is limited to skin breakdown. There is no tunneling or undermining noted. There is a large amount of serous drainage noted. The wound margin is flat and intact. There is large (67-100%) red granulation within the wound bed. There is a small (1-33%) amount of necrotic tissue within the wound bed including Adherent Slough. The periwound skin appearance exhibited: Dry/Scaly. The periwound skin appearance did not exhibit: Callus, Crepitus, Excoriation, Fluctuance, Friable, Induration, Localized Edema, Rash, Scarring, Maceration, Moist, Atrophie Blanche, Cyanosis, Ecchymosis, Hemosiderin Staining, Mottled, Pallor, Rubor, Erythema. The periwound has tenderness on palpation. Assessment Active Problems ICD-10 I89.0 - Lymphedema, not elsewhere classified L97.222 - Non-pressure chronic ulcer of left calf with fat layer exposed E66.01 - Morbid (severe) obesity due to excess calories N18.3 - Chronic kidney disease, stage 3 (  moderate) Heather Zuniga, Heather Zuniga ([id number]) This 81 year old patient who has the underlying problem of chronic kidney disease stage III has had bilateral lower extremity lymphedema and has had no workup for this. I would recommend venous duplex studies of both lower extremities. I have also recommended elevation of the limbs and have discussed this in detail both with the daughter, the patient and the caregiver at the assisted living facility. We will use silver alginate and a Profore lite wrap to start off  with. If she tolerates this well we may increase to a 4-layer Profore. She has been asked to come to see as an regular basis and all questions have been answered. Plan Wound Cleansing: Wound #1 Left,Posterior Lower Leg: Clean wound with Normal Saline. Anesthetic: Wound #1 Left,Posterior Lower Leg: Topical Lidocaine 4% cream applied to wound bed prior to debridement Primary Wound Dressing: Wound #1 Left,Posterior Lower Leg: Aquacel Ag Secondary Dressing: Wound #1 Left,Posterior Lower Leg: ABD pad Dressing Change Frequency: Wound #1 Left,Posterior Lower Leg: Change dressing every week - or as needed Follow-up Appointments: Wound #1 Left,Posterior Lower Leg: Return Appointment in 1 week. Edema Control: Wound #1 Left,Posterior Lower Leg: 3 Layer Compression System - Left Lower Extremity - Please anchor with unna at the top Home Health: Wound #1 Left,Posterior Lower Leg: Continue Home Health Visits - HHRN may change wrap if wrap slips or is uncomfortable Home Health Nurse may visit PRN to address patient s wound care needs. FACE TO FACE ENCOUNTER: MEDICARE and MEDICAID PATIENTS: I certify that this patient is under my care and that I had a face-to-face encounter that meets the physician face-to-face encounter requirements with this patient on this date. The encounter with the patient was in whole or in part for the following MEDICAL CONDITION: (primary reason for Home Healthcare) MEDICAL NECESSITY: I certify, that based on my findings, NURSING services are a medically necessary home health service. HOME BOUND STATUS: I certify that my clinical findings support that this patient is homebound (i.e., Due to illness or injury, pt requires aid of supportive devices such as crutches, cane, wheelchairs, walkers, the use Zuniga, Heather ([id number]) of special transportation or the assistance of another person to leave their place of residence. There is a normal inability to leave the home and  doing so requires considerable and taxing effort. Other absences are for medical reasons / religious services and are infrequent or of short duration when for other reasons). If current dressing causes regression in wound condition, may D/C ordered dressing product/s and apply Normal Saline Moist Dressing daily until next Wound Healing Center / Other MD appointment. Notify Wound Healing Center of regression in wound condition at [phone]. Please direct any NON-WOUND related issues/requests for orders to patient's Primary Care Physician Services and Therapies ordered were: Venous Studies -Bilateral This 81 year old patient who has the underlying problem of chronic kidney disease stage III has had bilateral lower extremity lymphedema and has had no workup for this. I would recommend venous duplex studies of both lower extremities. I have also recommended elevation of the limbs and have discussed this in detail both with the daughter, the patient and the caregiver at the assisted living facility. We will use silver alginate and a Profore lite wrap to start off with. If she tolerates this well we may increase to a 4-layer Profore. She has been asked to come to see as an regular basis and all questions have been answered. Electronic Signature(s) Signed: 06/17/2015 9:31:09 AM By: Evlyn Kanner MD, FACS Entered By: Meyer Russel  Heather Zuniga on 06/17/2015 09:31:09 Heather Zuniga, Heather Zuniga ([id number]) -------------------------------------------------------------------------------- ROS/PFSH Details Patient Name: Heather Zuniga, Heather Zuniga Date of Service: 06/17/2015 8:00 AM Medical Record Number: [id number] Patient Account Number: [id number] Date of Birth/Sex: 1922-09-25 (81 y.o. Female) Treating RN: Curtis Sites Primary Care Physician: Bjorn Pippin Other Clinician: Referring Physician: Fidel Levy Treating Physician/Extender: Rudene Re in Treatment: 0 Information Obtained From Patient Caregiver Wound  History Do you currently have one or more open woundso Yes How many open wounds do you currently haveo 1 Approximately how long have you had your woundso 2-3 weeks How have you been treating your wound(s) until nowo gauze and ace wrap Has your wound(s) ever healed and then re-openedo No Have you had any lab work done in the past montho No Have you tested positive for an antibiotic resistant organism (MRSA, VRE)o No Have you tested positive for osteomyelitis (bone infection)o No Have you had any tests for circulation on your legso No Have you had other problems associated with your woundso Swelling Cardiovascular Complaints and Symptoms: Positive for: LE edema Medical History: Positive for: Congestive Heart Failure Genitourinary Complaints and Symptoms: Positive for: Incontinence/dribbling Integumentary (Skin) Complaints and Symptoms: Positive for: Wounds - wound on lower back Psychiatric Complaints and Symptoms: No Complaints or Symptoms Complaints and Symptoms: Negative for: Anxiety; Claustrophobia Medical History: Negative for: Anorexia/bulimia; Confinement Anxiety Heather Zuniga, Heather Zuniga ([id number]) Past Medical History Notes: depression Constitutional Symptoms (General Health) Complaints and Symptoms: No Complaints or Symptoms Eyes Complaints and Symptoms: No Complaints or Symptoms Medical History: Positive for: Cataracts; Glaucoma Past Medical History Notes: blind in right eye Ear/Nose/Mouth/Throat Complaints and Symptoms: No Complaints or Symptoms Hematologic/Lymphatic Complaints and Symptoms: No Complaints or Symptoms Medical History: Positive for: Anemia - iron Gastrointestinal Complaints and Symptoms: No Complaints or Symptoms Endocrine Complaints and Symptoms: No Complaints or Symptoms Immunological Complaints and Symptoms: No Complaints or Symptoms Musculoskeletal Medical History: Positive for: Osteoarthritis Neurologic Heather Zuniga, Heather Zuniga  ([id number]) Complaints and Symptoms: No Complaints or Symptoms Oncologic Complaints and Symptoms: No Complaints or Symptoms Medical History: Negative for: Received Chemotherapy; Received Radiation HBO Extended History Items Eyes: Eyes: Cataracts Glaucoma Immunizations Immunization Notes: up to date Hospitalization / Surgery History Name of Hospital Purpose of Hospitalization/Surgery Date [facility] wound on back 05/09/2012 Family and Social History Cancer: No; Diabetes: No; Heart Disease: No; Hereditary Spherocytosis: No; Hypertension: No; Kidney Disease: No; Lung Disease: No; Seizures: No; Stroke: No; Thyroid Problems: No; Tuberculosis: No; Never smoker; Marital Status - Divorced; Alcohol Use: Never; Drug Use: No History; Caffeine Use: Never; Financial Concerns: No; Food, Clothing or Shelter Needs: No; Support System Lacking: No; Transportation Concerns: No; Advanced Directives: No; Patient does not want information on Advanced Directives Physician Affirmation I have reviewed and agree with the above information. Electronic Signature(s) Signed: 06/17/2015 8:40:41 AM By: Evlyn Kanner MD, FACS Signed: 06/17/2015 5:19:50 PM By: Curtis Sites Entered By: Evlyn Kanner on 06/17/2015 08:40:41 Heather Zuniga, Heather Zuniga ([id number]) -------------------------------------------------------------------------------- SuperBill Details Patient Name: Heather Zuniga Date of Service: 06/17/2015 Medical Record Number: [id number] Patient Account Number: [id number] Date of Birth/Sex: Jun 17, 1923 (81 y.o. Female) Treating RN: Curtis Sites Primary Care Physician: Bjorn Pippin Other Clinician: Referring Physician: Fidel Levy Treating Physician/Extender: Rudene Re in Treatment: 0 Diagnosis Coding ICD-10 Codes Code Description I89.0 Lymphedema, not elsewhere classified L97.222 Non-pressure chronic ulcer of left calf with fat layer exposed E66.01 Morbid (severe) obesity due to excess calories N18.3  Chronic kidney disease, stage 3 (moderate) Facility Procedures CPT4: Description Modifier Quantity Code 22297989 99213 - WOUND CARE VISIT-LEV 3 EST PT 1 CPT4:  16109604 (Facility Use Only) 29581LT - APPLY MULTLAY COMPRS LWR LT 1 LEG Physician Procedures CPT4 Code: 5409811 Description: 99204 - WC PHYS LEVEL 4 - NEW PT ICD-10 Description Diagnosis I89.0 Lymphedema, not elsewhere classified L97.222 Non-pressure chronic ulcer of left calf with fat l N18.3 Chronic kidney disease, stage 3 (moderate) E66.01 Morbid (severe)  obesity due to excess calories Modifier: ayer exposed Quantity: 1 Electronic Signature(s) Signed: 06/17/2015 9:31:27 AM By: Evlyn Kanner MD, FACS Entered By: Evlyn Kanner on 06/17/2015 09:31:27

## 2015-06-18 NOTE — Progress Notes (Signed)
Heather Zuniga, Heather Zuniga ([id number]) Visit Report for 06/17/2015 Allergy List Details Patient Name: Heather Zuniga, Heather Zuniga Date of Service: 06/17/2015 8:00 AM Medical Record Number: [id number] Patient Account Number: [id number] Date of Birth/Sex: 27-May-1923 (81 y.o. Female) Treating RN: Curtis Sites Primary Care Physician: Bjorn Pippin Other Clinician: Referring Physician: Fidel Levy Treating Physician/Extender: Rudene Re in Treatment: 0 Allergies Active Allergies No Known Allergies Allergy Notes Electronic Signature(s) Signed: 06/17/2015 5:19:50 PM By: Curtis Sites Entered By: Curtis Sites on 06/17/2015 08:22:29 Heather Zuniga ([id number]) -------------------------------------------------------------------------------- Arrival Information Details Patient Name: Heather Zuniga Date of Service: 06/17/2015 8:00 AM Medical Record Number: [id number] Patient Account Number: [id number] Date of Birth/Sex: [birth year] (81 y.o. Female) Treating RN: Curtis Sites Primary Care Physician: Bjorn Pippin Other Clinician: Referring Physician: Fidel Levy Treating Physician/Extender: Rudene Re in Treatment: 0 Visit Information Patient Arrived: Wheel Chair Arrival Time: 08:17 Accompanied By: staff and dtr Transfer Assistance: Manual Patient Identification Verified: Yes Secondary Verification Process Yes Completed: Patient Has Alerts: Yes Patient Alerts: aspirin 81 Electronic Signature(s) Signed: 06/17/2015 5:19:50 PM By: Curtis Sites Entered By: Curtis Sites on 06/17/2015 08:19:21 Heather Zuniga ([id number]) -------------------------------------------------------------------------------- Clinic Level of Care Assessment Details Patient Name: Heather Zuniga Date of Service: 06/17/2015 8:00 AM Medical Record Number: [id number] Patient Account Number: [id number] Date of Birth/Sex: 08/22/1922 (81 y.o. Female) Treating RN: Curtis Sites Primary Care Physician: Bjorn Pippin Other  Clinician: Referring Physician: Fidel Levy Treating Physician/Extender: Rudene Re in Treatment: 0 Clinic Level of Care Assessment Items TOOL 1 Quantity Score []  - Use when EandM and Procedure is performed on INITIAL visit 0 ASSESSMENTS - Nursing Assessment / Reassessment X - General Physical Exam (combine w/ comprehensive assessment (listed just 1 20 below) when performed on new pt. evals) X - Comprehensive Assessment (HX, ROS, Risk Assessments, Wounds Hx, etc.) 1 25 ASSESSMENTS - Wound and Skin Assessment / Reassessment []  - Dermatologic / Skin Assessment (not related to wound area) 0 ASSESSMENTS - Ostomy and/or Continence Assessment and Care []  - Incontinence Assessment and Management 0 []  - Ostomy Care Assessment and Management (repouching, etc.) 0 PROCESS - Coordination of Care X - Simple Patient / Family Education for ongoing care 1 15 []  - Complex (extensive) Patient / Family Education for ongoing care 0 X - Staff obtains Chiropractor, Records, Test Results / Process Orders 1 10 []  - Staff telephones HHA, Nursing Homes / Clarify orders / etc 0 []  - Routine Transfer to another Facility (non-emergent condition) 0 []  - Routine Hospital Admission (non-emergent condition) 0 X - New Admissions / Manufacturing engineer / Ordering NPWT, Apligraf, etc. 1 15 []  - Emergency Hospital Admission (emergent condition) 0 PROCESS - Special Needs []  - Pediatric / Minor Patient Management 0 []  - Isolation Patient Management 0 Heather Zuniga, Heather Zuniga ([id number]) []  - Hearing / Language / Visual special needs 0 []  - Assessment of Community assistance (transportation, D/C planning, etc.) 0 []  - Additional assistance / Altered mentation 0 []  - Support Surface(s) Assessment (bed, cushion, seat, etc.) 0 INTERVENTIONS - Miscellaneous []  - External ear exam 0 []  - Patient Transfer (multiple staff / Nurse, adult / Similar devices) 0 []  - Simple Staple / Suture removal (25 or less) 0 []  - Complex  Staple / Suture removal (26 or more) 0 []  - Hypo/Hyperglycemic Management (do not check if billed separately) 0 X - Ankle / Brachial Index (ABI) - do not check if billed separately 1 15 Has the patient been seen at the hospital within the last three years: Yes Total Score: 100  Level Of Care: New/Established - Level 3 Electronic Signature(s) Signed: 06/17/2015 5:19:50 PM By: Curtis Sites Entered By: Curtis Sites on 06/17/2015 09:07:08 Heather Zuniga ([id number]) -------------------------------------------------------------------------------- Encounter Discharge Information Details Patient Name: Heather Zuniga Date of Service: 06/17/2015 8:00 AM Medical Record Number: [id number] Patient Account Number: [id number] Date of Birth/Sex: 1923/04/07 (81 y.o. Female) Treating RN: Curtis Sites Primary Care Physician: Bjorn Pippin Other Clinician: Referring Physician: Fidel Levy Treating Physician/Extender: Rudene Re in Treatment: 0 Encounter Discharge Information Items Discharge Pain Level: 0 Discharge Condition: Stable Ambulatory Status: Wheelchair Discharge Destination: Home Transportation: Private Auto Accompanied By: dtr and staff Schedule Follow-up Appointment: Yes Medication Reconciliation completed and provided to Patient/Care No Aminata [place]: Provided on Clinical Summary of Care: 06/17/2015 Form Type Recipient Paper Patient HB Electronic Signature(s) Signed: 06/17/2015 9:27:50 AM By: Gwenlyn Perking Entered By: Gwenlyn Perking on 06/17/2015 09:27:49 Heather Zuniga ([id number]) -------------------------------------------------------------------------------- Lower Extremity Assessment Details Patient Name: Heather Zuniga Date of Service: 06/17/2015 8:00 AM Medical Record Number: [id number] Patient Account Number: [id number] Date of Birth/Sex: 02-24-1923 (81 y.o. Female) Treating RN: Curtis Sites Primary Care Physician: Bjorn Pippin Other Clinician: Referring Physician:  Fidel Levy Treating Physician/Extender: Rudene Re in Treatment: 0 Edema Assessment Assessed: [Left: No] [Right: No] Edema: [Left: Yes] [Right: Yes] Calf Left: Right: Point of Measurement: 31 cm From Medial Instep 41.6 cm 48.4 cm Ankle Left: Right: Point of Measurement: 10 cm From Medial Instep 28 cm 31 cm Vascular Assessment Pulses: Posterior Tibial Palpable: [Left:No] [Right:No] Dorsalis Pedis Palpable: [Left:Yes] [Right:Yes] Extremity colors, hair growth, and conditions: Extremity Color: [Left:Hyperpigmented] [Right:Hyperpigmented] Hair Growth on Extremity: [Left:No] [Right:No] Temperature of Extremity: [Left:Warm] [Right:Warm] Capillary Refill: [Left:< 3 seconds] [Right:< 3 seconds] Blood Pressure: Brachial: [Left:144] [Right:148] Dorsalis Pedis: 152 [Left:Dorsalis Pedis: 136] Ankle: Posterior Tibial: 148 [Left:Posterior Tibial: 125 1.03] [Right:0.92] Toe Nail Assessment Left: Right: Thick: Yes Yes Discolored: Yes Yes Deformed: Yes Yes Improper Length and Hygiene: No No Electronic Signature(sRAYNETTE, ARRAS ([id number]) Signed: 06/17/2015 5:19:50 PM By: Curtis Sites Entered By: Curtis Sites on 06/17/2015 08:53:46 Heather Zuniga ([id number]) -------------------------------------------------------------------------------- Multi Wound Chart Details Patient Name: Heather Zuniga Date of Service: 06/17/2015 8:00 AM Medical Record Number: [id number] Patient Account Number: [id number] Date of Birth/Sex: [birth year] (81 y.o. Female) Treating RN: Curtis Sites Primary Care Physician: Bjorn Pippin Other Clinician: Referring Physician: Fidel Levy Treating Physician/Extender: Rudene Re in Treatment: 0 Vital Signs Height(in): 60 Pulse(bpm): 63 Weight(lbs): 213 Blood Pressure 152/70 (mmHg): Body Mass Index(BMI): 42 Temperature(F): 97.7 Respiratory Rate 16 (breaths/min): Photos: [1:No Photos] [N/A:N/A] Wound Location: [1:Left Lower Leg  - Posterior N/A] Wounding Event: [1:Not Known] [N/A:N/A] Primary Etiology: [1:Venous Leg Ulcer] [N/A:N/A] Comorbid History: [1:Cataracts, Glaucoma, Anemia, Congestive Heart Failure, Osteoarthritis] [N/A:N/A] Date Acquired: [1:06/03/2015] [N/A:N/A] Weeks of Treatment: [1:0] [N/A:N/A] Wound Status: [1:Open] [N/A:N/A] Measurements L x W x D 5x9x0.1 [N/A:N/A] (cm) Area (cm) : [1:35.343] [N/A:N/A] Volume (cm) : [1:3.534] [N/A:N/A] Classification: [1:Full Thickness Without Exposed Support Structures] [N/A:N/A] Exudate Amount: [1:Large] [N/A:N/A] Exudate Type: [1:Serous] [N/A:N/A] Exudate Color: [1:amber] [N/A:N/A] Wound Margin: [1:Flat and Intact] [N/A:N/A] Granulation Amount: [1:Large (67-100%)] [N/A:N/A] Granulation Quality: [1:Red] [N/A:N/A] Necrotic Amount: [1:Small (1-33%)] [N/A:N/A] Exposed Structures: [1:Fascia: No Fat: No Tendon: No Muscle: No Joint: No Bone: No] [N/A:N/A] Limited to Skin Breakdown Epithelialization: None N/A N/A Periwound Skin Texture: Edema: No N/A N/A Excoriation: No Induration: No Callus: No Crepitus: No Fluctuance: No Friable: No Rash: No Scarring: No Periwound Skin Dry/Scaly: Yes N/A N/A Moisture: Maceration: No Moist: No Periwound Skin Color: Heather Zuniga: No N/A N/A Cyanosis: No  Ecchymosis: No Erythema: No Hemosiderin Staining: No Mottled: No Pallor: No Rubor: No Tenderness on Yes N/A N/A Palpation: Wound Preparation: Ulcer Cleansing: N/A N/A Rinsed/Irrigated with Saline Topical Anesthetic Applied: Other: lidocaine 4% Treatment Notes Electronic Signature(s) Signed: 06/17/2015 5:19:50 PM By: Curtis Sites Entered By: Curtis Sites on 06/17/2015 09:02:46 Heather Zuniga, Heather Zuniga ([id number]) -------------------------------------------------------------------------------- Multi-Disciplinary Care Plan Details Patient Name: Heather Zuniga Date of Service: 06/17/2015 8:00 AM Medical Record Number: [id number] Patient Account Number:  [id number] Date of Birth/Sex: [birth year] (81 y.o. Female) Treating RN: Curtis Sites Primary Care Physician: Bjorn Pippin Other Clinician: Referring Physician: Fidel Levy Treating Physician/Extender: Rudene Re in Treatment: 0 Active Inactive Abuse / Safety / Falls / Self Care Management Nursing Diagnoses: Impaired physical mobility Potential for falls Goals: Patient will remain injury free Date Initiated: 06/17/2015 Goal Status: Active Interventions: Assess fall risk on admission and as needed Notes: Orientation to the Wound Care Program Nursing Diagnoses: Knowledge deficit related to the wound healing center program Goals: Patient/caregiver will verbalize understanding of the Wound Healing Center Program Date Initiated: 06/17/2015 Goal Status: Active Interventions: Provide education on orientation to the wound center Notes: Venous Leg Ulcer Nursing Diagnoses: Potential for venous Insuffiency (use before diagnosis confirmed) Goals: Non-invasive venous studies are completed as ordered Heather Zuniga, Heather Zuniga ([id number]) Date Initiated: 06/17/2015 Goal Status: Active Interventions: Assess peripheral edema status every visit. Notes: Wound/Skin Impairment Nursing Diagnoses: Impaired tissue integrity Goals: Patient/caregiver will verbalize understanding of skin care regimen Date Initiated: 06/17/2015 Goal Status: Active Ulcer/skin breakdown will have a volume reduction of 30% by week 4 Date Initiated: 06/17/2015 Goal Status: Active Ulcer/skin breakdown will have a volume reduction of 50% by week 8 Date Initiated: 06/17/2015 Goal Status: Active Ulcer/skin breakdown will have a volume reduction of 80% by week 12 Date Initiated: 06/17/2015 Goal Status: Active Ulcer/skin breakdown will heal within 14 weeks Date Initiated: 06/17/2015 Goal Status: Active Interventions: Assess ulceration(s) every visit Notes: Electronic Signature(s) Signed: 06/17/2015 5:19:50 PM By:  Curtis Sites Entered By: Curtis Sites on 06/17/2015 09:02:34 Heather Zuniga ([id number]) -------------------------------------------------------------------------------- Patient/Caregiver Education Details Patient Name: Heather Zuniga Date of Service: 06/17/2015 8:00 AM Medical Record Number: [id number] Patient Account Number: [id number] Date of Birth/Gender: 1923-04-17 (81 y.o. Female) Treating RN: Curtis Sites Primary Care Physician: Bjorn Pippin Other Clinician: Referring Physician: Fidel Levy Treating Physician/Extender: Rudene Re in Treatment: 0 Education Assessment Education Provided To: Patient and Caregiver Education Topics Provided Venous: Handouts: Controlling Swelling with Multilayered Compression Wraps Methods: Explain/Verbal, Printed Responses: State content correctly Electronic Signature(s) Signed: 06/17/2015 5:19:50 PM By: Curtis Sites Entered By: Curtis Sites on 06/17/2015 09:26:26 Heather Zuniga ([id number]) -------------------------------------------------------------------------------- Wound Assessment Details Patient Name: Heather Zuniga Date of Service: 06/17/2015 8:00 AM Medical Record Number: [id number] Patient Account Number: [id number] Date of Birth/Sex: 30-Jan-1923 (81 y.o. Female) Treating RN: Curtis Sites Primary Care Physician: Bjorn Pippin Other Clinician: Referring Physician: Fidel Levy Treating Physician/Extender: Rudene Re in Treatment: 0 Wound Status Wound Number: 1 Primary Venous Leg Ulcer Etiology: Wound Location: Left Lower Leg - Posterior Wound Open Wounding Event: Not Known Status: Date Acquired: 06/03/2015 Comorbid Cataracts, Glaucoma, Anemia, Weeks Of Treatment: 0 History: Congestive Heart Failure, Clustered Wound: No Osteoarthritis Photos Photo Uploaded By: Curtis Sites on 06/17/2015 11:58:10 Wound Measurements Length: (cm) 5 Width: (cm) 9 Depth: (cm) 0.1 Area: (cm) 35.343 Volume:  (cm) 3.534 % Reduction in Area: % Reduction in Volume: Epithelialization: None Tunneling: No Undermining: No Wound Description Full Thickness Without Exposed Classification: Support Structures Wound Margin: Flat and Intact Exudate Large Amount:  Exudate Type: Serous Exudate Color: amber Foul Odor After Cleansing: No Wound Bed Granulation Amount: Large (67-100%) Exposed Structure Granulation Quality: Red Fascia Exposed: No Heather Zuniga, Heather Zuniga ([id number]) Necrotic Amount: Small (1-33%) Fat Layer Exposed: No Necrotic Quality: Adherent Slough Tendon Exposed: No Muscle Exposed: No Joint Exposed: No Bone Exposed: No Limited to Skin Breakdown Periwound Skin Texture Texture Color No Abnormalities Noted: No No Abnormalities Noted: No Callus: No Heather Zuniga: No Crepitus: No Cyanosis: No Excoriation: No Ecchymosis: No Fluctuance: No Erythema: No Friable: No Hemosiderin Staining: No Induration: No Mottled: No Localized Edema: No Pallor: No Rash: No Rubor: No Scarring: No Temperature / Pain Moisture Tenderness on Palpation: Yes No Abnormalities Noted: No Dry / Scaly: Yes Maceration: No Moist: No Wound Preparation Ulcer Cleansing: Rinsed/Irrigated with Saline Topical Anesthetic Applied: Other: lidocaine 4%, Treatment Notes Wound #1 (Left, Posterior Lower Leg) 1. Cleansed with: Clean wound with Normal Saline 2. Anesthetic Topical Lidocaine 4% cream to wound bed prior to debridement 4. Dressing Applied: Aquacel Ag 5. Secondary Dressing Applied ABD Pad 7. Secured with 3 Layer Compression System - Left Lower Extremity Electronic Signature(s) Signed: 06/17/2015 5:19:50 PM By: Curtis Sites Entered By: Curtis Sites on 06/17/2015 08:45:56 Heather Zuniga, Heather Zuniga ([id number]) Heather Zuniga, Heather Zuniga ([id number]) -------------------------------------------------------------------------------- Vitals Details Patient Name: Heather Zuniga Date of Service: 06/17/2015 8:00 AM Medical  Record Number: [id number] Patient Account Number: [id number] Date of Birth/Sex: 12-05-1922 (81 y.o. Female) Treating RN: Curtis Sites Primary Care Physician: Bjorn Pippin Other Clinician: Referring Physician: Fidel Levy Treating Physician/Extender: Rudene Re in Treatment: 0 Vital Signs Time Taken: 08:19 Temperature (F): 97.7 Height (in): 60 Pulse (bpm): 63 Source: Stated Respiratory Rate (breaths/min): 16 Weight (lbs): 213 Blood Pressure (mmHg): 152/70 Source: Stated Reference Range: 80 - 120 mg / dl Body Mass Index (BMI): 41.6 Electronic Signature(s) Signed: 06/17/2015 5:19:50 PM By: Curtis Sites Entered By: Curtis Sites on 06/17/2015 08:21:46

## 2015-06-18 NOTE — Progress Notes (Signed)
JNAI, SNELLGROVE ([id number]) Visit Report for 06/17/2015 Abuse/Suicide Risk Screen Details Patient Name: Heather Zuniga, Heather Zuniga Date of Service: 06/17/2015 8:00 AM Medical Record Number: [id number] Patient Account Number: [id number] Date of Birth/Sex: January 24, 1923 (81 y.o. Female) Treating RN: Curtis Sites Primary Care Physician: Bjorn Pippin Other Clinician: Referring Physician: Fidel Levy Treating Physician/Extender: Rudene Re in Treatment: 0 Abuse/Suicide Risk Screen Items Answer ABUSE/SUICIDE RISK SCREEN: Has anyone close to you tried to hurt or harm you recentlyo No Do you feel uncomfortable with anyone in your familyo No Has anyone forced you do things that you didnot want to doo No Do you have any thoughts of harming yourselfo No Patient displays signs or symptoms of abuse and/or neglect. No Electronic Signature(s) Signed: 06/17/2015 5:19:50 PM By: Curtis Sites Entered By: Curtis Sites on 06/17/2015 08:28:30 Heather Zuniga ([id number]) -------------------------------------------------------------------------------- Activities of Daily Living Details Patient Name: Heather Zuniga Date of Service: 06/17/2015 8:00 AM Medical Record Number: [id number] Patient Account Number: [id number] Date of Birth/Sex: 1923-03-24 (81 y.o. Female) Treating RN: Curtis Sites Primary Care Physician: Bjorn Pippin Other Clinician: Referring Physician: Fidel Levy Treating Physician/Extender: Rudene Re in Treatment: 0 Activities of Daily Living Items Answer Activities of Daily Living (Please select one for each item) Drive Automobile Not Able Take Medications Need Assistance Use Telephone Completely Able Care for Appearance Need Assistance Use Toilet Need Assistance Bath / Shower Need Assistance Dress Self Need Assistance Feed Self Completely Able Walk Need Assistance Get In / Out Bed Completely Able Housework Need Assistance Prepare Meals Need Assistance Handle Money Need  Assistance Shop for Self Need Assistance Electronic Signature(s) Signed: 06/17/2015 5:19:50 PM By: Curtis Sites Entered By: Curtis Sites on 06/17/2015 08:29:21 Heather Zuniga ([id number]) -------------------------------------------------------------------------------- Education Assessment Details Patient Name: Heather Zuniga Date of Service: 06/17/2015 8:00 AM Medical Record Number: [id number] Patient Account Number: [id number] Date of Birth/Sex: 06-29-1923 (81 y.o. Female) Treating RN: Curtis Sites Primary Care Physician: Bjorn Pippin Other Clinician: Referring Physician: Fidel Levy Treating Physician/Extender: Rudene Re in Treatment: 0 Primary Learner Assessed: Caregiver limited understanding of Reason Patient is not Primary Learner: wound Learning Preferences/Education Level/Primary Language Learning Preference: Explanation, Demonstration Highest Education Level: High School Preferred Language: English Cognitive Barrier Assessment/Beliefs Language Barrier: No Translator Needed: No Memory Deficit: No Emotional Barrier: No Cultural/Religious Beliefs Affecting Medical No Care: Physical Barrier Assessment Impaired Vision: No Impaired Hearing: No Decreased Hand dexterity: No Knowledge/Comprehension Assessment Knowledge Level: Medium Comprehension Level: Medium Ability to understand written Medium instructions: Ability to understand verbal Medium instructions: Motivation Assessment Anxiety Level: Calm Cooperation: Cooperative Education Importance: Acknowledges Need Interest in Health Problems: Asks Questions Perception: Coherent Willingness to Engage in Self- Medium Management Activities: Readiness to Engage in Self- Medium Management Activities: RAYMIE, TRANI ([id number]) Electronic Signature(s) Signed: 06/17/2015 5:19:50 PM By: Curtis Sites Entered By: Curtis Sites on 06/17/2015 08:29:55 CALANDRA, MADURA  ([id number]) -------------------------------------------------------------------------------- Fall Risk Assessment Details Patient Name: Heather Zuniga Date of Service: 06/17/2015 8:00 AM Medical Record Number: [id number] Patient Account Number: [id number] Date of Birth/Sex: 1922-09-05 (81 y.o. Female) Treating RN: Curtis Sites Primary Care Physician: Bjorn Pippin Other Clinician: Referring Physician: Fidel Levy Treating Physician/Extender: Rudene Re in Treatment: 0 Fall Risk Assessment Items FALL RISK ASSESSMENT: History of falling - immediate or within 3 months 0 No Secondary diagnosis 0 No Ambulatory aid None/bed rest/wheelchair/nurse 0 No Crutches/cane/walker 15 Yes Furniture 0 No IV Access/Saline Lock 0 No Gait/Training Normal/bed rest/immobile 0 No Weak 10 Yes Impaired 0 No Mental Status Oriented to own ability 0 Yes  Electronic Signature(s) Signed: 06/17/2015 5:19:50 PM By: Curtis Sites Entered By: Curtis Sites on 06/17/2015 08:30:05 Heather Zuniga ([id number]) -------------------------------------------------------------------------------- Foot Assessment Details Patient Name: Heather Zuniga Date of Service: 06/17/2015 8:00 AM Medical Record Number: [id number] Patient Account Number: [id number] Date of Birth/Sex: 26-Feb-1923 (81 y.o. Female) Treating RN: Curtis Sites Primary Care Physician: Bjorn Pippin Other Clinician: Referring Physician: Fidel Levy Treating Physician/Extender: Rudene Re in Treatment: 0 Foot Assessment Items Site Locations + = Sensation present, - = Sensation absent, C = Callus, U = Ulcer R = Redness, W = Warmth, M = Maceration, PU = Pre-ulcerative lesion F = Fissure, S = Swelling, D = Dryness Assessment Right: Left: Other Deformity: No No Prior Foot Ulcer: No No Prior Amputation: No No Charcot Joint: No No Ambulatory Status: Ambulatory With Help Assistance Device: Walker Gait: Charity fundraiser) Signed: 06/17/2015 5:19:50 PM By: Curtis Sites Entered By: Curtis Sites on 06/17/2015 08:38:16 Heather Zuniga ([id number]) -------------------------------------------------------------------------------- Nutrition Risk Assessment Details Patient Name: Heather Zuniga Date of Service: 06/17/2015 8:00 AM Medical Record Number: [id number] Patient Account Number: [id number] Date of Birth/Sex: Oct 22, 1922 (81 y.o. Female) Treating RN: Curtis Sites Primary Care Physician: Bjorn Pippin Other Clinician: Referring Physician: Fidel Levy Treating Physician/Extender: Rudene Re in Treatment: 0 Height (in): 60 Weight (lbs): 213 Body Mass Index (BMI): 41.6 Nutrition Risk Assessment Items NUTRITION RISK SCREEN: I have an illness or condition that made me change the kind and/or 0 No amount of food I eat I eat fewer than two meals per day 0 No I eat few fruits and vegetables, or milk products 0 No I have three or more drinks of beer, liquor or wine almost every day 0 No I have tooth or mouth problems that make it hard for me to eat 0 No I don't always have enough money to buy the food I need 0 No I eat alone most of the time 0 No I take three or more different prescribed or over-the-counter drugs a 1 Yes day Without wanting to, I have lost or gained 10 pounds in the last six 0 No months I am not always physically able to shop, cook and/or feed myself 0 No Nutrition Protocols Good Risk Protocol 0 No interventions needed Moderate Risk Protocol Electronic Signature(s) Signed: 06/17/2015 5:19:50 PM By: Curtis Sites Entered By: Curtis Sites on 06/17/2015 08:30:09

## 2015-06-24 ENCOUNTER — Encounter: Payer: Medicare Other | Admitting: Surgery

## 2015-06-24 DIAGNOSIS — L97222 Non-pressure chronic ulcer of left calf with fat layer exposed: Secondary | ICD-10-CM | POA: Diagnosis not present

## 2015-06-24 NOTE — Progress Notes (Addendum)
NAYELY, WILDS ([id number]) Visit Report for 06/24/2015 Chief Complaint Document Details Patient Name: Heather Zuniga, Heather Zuniga 06/24/2015 11:00 Date of Service: AM Medical Record [id number] Number: Patient Account Number: [id number] 1923/06/11 (81 y.o. Treating RN: Huel Coventry Date of Birth/Sex: Female) Other Clinician: Primary Care Physician: Bjorn Pippin Treating Evlyn Kanner Referring Physician: Bjorn Pippin Physician/Extender: Tania Ade in Treatment: 1 Information Obtained from: Patient Chief Complaint Patient seen for complaints of Non-Healing Wound. this 81 year old patient has had bilateral lower extremity swelling for over 3 years and now has had an open ulceration on the posterior part of the left leg for several months. Electronic Signature(s) Signed: 06/24/2015 11:15:11 AM By: Evlyn Kanner MD, FACS Entered By: Evlyn Kanner on 06/24/2015 11:15:10 KALENE, ABDO ([id number]) -------------------------------------------------------------------------------- HPI Details Patient Name: Heather Zuniga, Heather Zuniga 06/24/2015 11:00 Date of Service: AM Medical Record [id number] Number: Patient Account Number: [id number] 02/15/1923 (81 y.o. Treating RN: Huel Coventry Date of Birth/Sex: Female) Other Clinician: Primary Care Physician: Bjorn Pippin Treating Evlyn Kanner Referring Physician: Bjorn Pippin Physician/Extender: Tania Ade in Treatment: 1 History of Present Illness Location: ulcerated area on the left posterior leg. She also has bilateral lower extremity swelling Quality: Patient reports experiencing a dull pain to affected area(s). Severity: Patient states wound are getting worse. Duration: Patient has had the wound for > 3 months prior to seeking treatment at the wound center Timing: Pain in wound is Intermittent (comes and goes Context: The wound appeared gradually over time Modifying Factors: Other treatment(s) tried include: she has had doxycycline and a compression ace wrap applied Associated Signs  and Symptoms: Patient reports having increase swelling. HPI Description: 81 year old patient sent by her PCP Venora Maples for a stasis ulcer on her left lower leg which she's had for many months. As indicated to him recently with leg swelling and oozing. A past medical history significant for GERD, chronic kidney disease stage III,anemia, arthritis, pyuria, edema and depression. She has never been a smoker. On clinical examination he found that she had a left lower leg ulceration with oozing and was tender to palpation with some redness and +3 edema. He put her on doxycycline and referred her to the wound clinic. Electronic Signature(s) Signed: 06/24/2015 11:15:30 AM By: Evlyn Kanner MD, FACS Entered By: Evlyn Kanner on 06/24/2015 11:15:30 ZABELLA, CHAFFEE ([id number]) -------------------------------------------------------------------------------- Physical Exam Details Patient Name: Heather Zuniga, Heather Zuniga 06/24/2015 11:00 Date of Service: AM Medical Record [id number] Number: Patient Account Number: [id number] [birth year] (81 y.o. Treating RN: Huel Coventry Date of Birth/Sex: Female) Other Clinician: Primary Care Physician: Bjorn Pippin Treating Evlyn Kanner Referring Physician: Bjorn Pippin Physician/Extender: Tania Ade in Treatment: 1 Constitutional . Pulse regular. Respirations normal and unlabored. Afebrile. . Eyes Nonicteric. Reactive to light. Ears, Nose, Mouth, and Throat Lips, teeth, and gums WNL.Marland Kitchen Moist mucosa without lesions . Neck supple and nontender. No palpable supraclavicular or cervical adenopathy. Normal sized without goiter. Respiratory WNL. No retractions.. Cardiovascular Pedal Pulses WNL. the +2 lymphedema still persist.. Lymphatic No adneopathy. No adenopathy. No adenopathy. Musculoskeletal Adexa without tenderness or enlargement.. Digits and nails w/o clubbing, cyanosis, infection, petechiae, ischemia, or inflammatory conditions.. Integumentary (Hair, Skin) No suspicious  lesions. No crepitus or fluctuance. No peri-wound warmth or erythema. No masses.Marland Kitchen Psychiatric Judgement and insight Intact.. No evidence of depression, anxiety, or agitation.. Notes the ulcerated area on the left lower extremity posterior lateral calf has some slough which was washed out nicely with saline. She has healthy granulation tissue below this. Electronic Signature(s) Signed: 06/24/2015 11:22:33 AM By: Evlyn Kanner MD, FACS Entered By: Evlyn Kanner on 06/24/2015 11:22:33 Eisenmenger,  Unita ([id number]) -------------------------------------------------------------------------------- Physician Orders Details Patient Name: HUMAIRA, SCULLEY 06/24/2015 11:00 Date of Service: AM Medical Record [id number] Number: Patient Account Number: [id number] Aug 18, 1922 (81 y.o. Treating RN: Huel Coventry Date of Birth/Sex: Female) Other Clinician: Primary Care Physician: Bjorn Pippin Treating Shai Mckenzie Referring Physician: Bjorn Pippin Physician/Extender: Tania Ade in Treatment: 1 Verbal / Phone Orders: Yes Clinician: Huel Coventry Read Back and Verified: Yes Diagnosis Coding ICD-10 Coding Code Description I89.0 Lymphedema, not elsewhere classified L97.222 Non-pressure chronic ulcer of left calf with fat layer exposed E66.01 Morbid (severe) obesity due to excess calories N18.3 Chronic kidney disease, stage 3 (moderate) Wound Cleansing Wound #1 Left,Posterior Lower Leg o Clean wound with Normal Saline. Anesthetic Wound #1 Left,Posterior Lower Leg o Topical Lidocaine 4% cream applied to wound bed prior to debridement Primary Wound Dressing Wound #1 Left,Posterior Lower Leg o Aquacel Ag Secondary Dressing Wound #1 Left,Posterior Lower Leg o ABD pad Dressing Change Frequency Wound #1 Left,Posterior Lower Leg o Change dressing every week - or as needed Follow-up Appointments Wound #1 Left,Posterior Lower Leg o Return Appointment in 1 week. Edema Control Nunn, Kylie  ([id number]) Wound #1 Left,Posterior Lower Leg o 3 Layer Compression System - Left Lower Extremity Home Health Wound #1 Left,Posterior Lower Leg o Continue Home Health Visits - HHRN may change wrap if wrap slips or is uncomfortable o Home Health Nurse may visit PRN to address patientos wound care needs. o FACE TO FACE ENCOUNTER: MEDICARE and MEDICAID PATIENTS: I certify that this patient is under my care and that I had a face-to-face encounter that meets the physician face-to-face encounter requirements with this patient on this date. The encounter with the patient was in whole or in part for the following MEDICAL CONDITION: (primary reason for Home Healthcare) MEDICAL NECESSITY: I certify, that based on my findings, NURSING services are a medically necessary home health service. HOME BOUND STATUS: I certify that my clinical findings support that this patient is homebound (i.e., Due to illness or injury, pt requires aid of supportive devices such as crutches, cane, wheelchairs, walkers, the use of special transportation or the assistance of another person to leave their place of residence. There is a normal inability to leave the home and doing so requires considerable and taxing effort. Other absences are for medical reasons / religious services and are infrequent or of short duration when for other reasons). o If current dressing causes regression in wound condition, may D/C ordered dressing product/s and apply Normal Saline Moist Dressing daily until next Wound Healing Center / Other MD appointment. Notify Wound Healing Center of regression in wound condition at [phone]. o Please direct any NON-WOUND related issues/requests for orders to patient's Primary Care Physician Electronic Signature(s) Signed: 06/24/2015 4:35:06 PM By: Evlyn Kanner MD, FACS Signed: 06/24/2015 5:36:16 PM By: Elliot Gurney RN, BSN, Kim RN, BSN Entered By: Elliot Gurney, RN, BSN, Kim on 06/24/2015  11:33:58 ESTEPHANI, POPPER ([id number]) -------------------------------------------------------------------------------- Problem List Details Patient Name: Heather Zuniga, Heather Zuniga 06/24/2015 11:00 Date of Service: AM Medical Record [id number] Number: Patient Account Number: [id number] March 30, 1923 (81 y.o. Treating RN: Huel Coventry Date of Birth/Sex: Female) Other Clinician: Primary Care Physician: Bjorn Pippin Treating Evlyn Kanner Referring Physician: Bjorn Pippin Physician/Extender: Tania Ade in Treatment: 1 Active Problems ICD-10 Encounter Code Description Active Date Diagnosis I89.0 Lymphedema, not elsewhere classified 06/17/2015 Yes L97.222 Non-pressure chronic ulcer of left calf with fat layer 06/17/2015 Yes exposed E66.01 Morbid (severe) obesity due to excess calories 06/17/2015 Yes N18.3 Chronic kidney disease, stage 3 (moderate) 06/17/2015 Yes Inactive Problems Resolved Problems  Electronic Signature(s) Signed: 06/24/2015 11:15:02 AM By: Evlyn Kanner MD, FACS Entered By: Evlyn Kanner on 06/24/2015 11:15:01 MEHLANI, MOCERI ([id number]) -------------------------------------------------------------------------------- Progress Note Details Patient Name: Heather Zuniga, Heather Zuniga 06/24/2015 11:00 Date of Service: AM Medical Record [id number] Number: Patient Account Number: [id number] [birth year] (81 y.o. Treating RN: Huel Coventry Date of Birth/Sex: Female) Other Clinician: Primary Care Physician: Bjorn Pippin Treating Evlyn Kanner Referring Physician: Bjorn Pippin Physician/Extender: Tania Ade in Treatment: 1 Subjective Chief Complaint Information obtained from Patient Patient seen for complaints of Non-Healing Wound. this 81 year old patient has had bilateral lower extremity swelling for over 3 years and now has had an open ulceration on the posterior part of the left leg for several months. History of Present Illness (HPI) The following HPI elements were documented for the patient's wound: Location: ulcerated  area on the left posterior leg. She also has bilateral lower extremity swelling Quality: Patient reports experiencing a dull pain to affected area(s). Severity: Patient states wound are getting worse. Duration: Patient has had the wound for > 3 months prior to seeking treatment at the wound center Timing: Pain in wound is Intermittent (comes and goes Context: The wound appeared gradually over time Modifying Factors: Other treatment(s) tried include: she has had doxycycline and a compression ace wrap applied Associated Signs and Symptoms: Patient reports having increase swelling. 81 year old patient sent by her PCP Venora Maples for a stasis ulcer on her left lower leg which she's had for many months. As indicated to him recently with leg swelling and oozing. A past medical history significant for GERD, chronic kidney disease stage III,anemia, arthritis, pyuria, edema and depression. She has never been a smoker. On clinical examination he found that she had a left lower leg ulceration with oozing and was tender to palpation with some redness and +3 edema. He put her on doxycycline and referred her to the wound clinic. Objective Constitutional Pulse regular. Respirations normal and unlabored. Afebrile. Vitals Time Taken: 11:04 AM, Height: 60 in, Weight: 213 lbs, BMI: 41.6, Temperature: 97.5 F, Pulse: 68 Shell, Khadijatou ([id number]) bpm, Respiratory Rate: 18 breaths/min, Blood Pressure: 142/78 mmHg. Eyes Nonicteric. Reactive to light. Ears, Nose, Mouth, and Throat Lips, teeth, and gums WNL.Marland Kitchen Moist mucosa without lesions . Neck supple and nontender. No palpable supraclavicular or cervical adenopathy. Normal sized without goiter. Respiratory WNL. No retractions.. Cardiovascular Pedal Pulses WNL. the +2 lymphedema still persist.. Lymphatic No adneopathy. No adenopathy. No adenopathy. Musculoskeletal Adexa without tenderness or enlargement.. Digits and nails w/o clubbing, cyanosis,  infection, petechiae, ischemia, or inflammatory conditions.Marland Kitchen Psychiatric Judgement and insight Intact.. No evidence of depression, anxiety, or agitation.. General Notes: the ulcerated area on the left lower extremity posterior lateral calf has some slough which was washed out nicely with saline. She has healthy granulation tissue below this. Integumentary (Hair, Skin) No suspicious lesions. No crepitus or fluctuance. No peri-wound warmth or erythema. No masses.. Wound #1 status is Open. Original cause of wound was Not Known. The wound is located on the Left,Posterior Lower Leg. The wound measures 4.5cm length x 9cm width x 0.1cm depth; 31.809cm^2 area and 3.181cm^3 volume. The wound is limited to skin breakdown. There is a large amount of serous drainage noted. The wound margin is flat and intact. There is large (67-100%) red granulation within the wound bed. There is a small (1-33%) amount of necrotic tissue within the wound bed including Adherent Slough. The periwound skin appearance exhibited: Dry/Scaly. The periwound skin appearance did not exhibit: Callus, Crepitus, Excoriation, Fluctuance, Friable, Induration, Localized Edema, Rash, Scarring, Maceration,  Moist, Atrophie Blanche, Cyanosis, Ecchymosis, Hemosiderin Staining, Mottled, Pallor, Rubor, Erythema. The periwound has tenderness on palpation. Assessment ZULEICA, SEITH ([id number]) Active Problems ICD-10 I89.0 - Lymphedema, not elsewhere classified L97.222 - Non-pressure chronic ulcer of left calf with fat layer exposed E66.01 - Morbid (severe) obesity due to excess calories N18.3 - Chronic kidney disease, stage 3 (moderate) Plan Wound Cleansing: Wound #1 Left,Posterior Lower Leg: Clean wound with Normal Saline. Anesthetic: Wound #1 Left,Posterior Lower Leg: Topical Lidocaine 4% cream applied to wound bed prior to debridement Primary Wound Dressing: Wound #1 Left,Posterior Lower Leg: Aquacel Ag Secondary Dressing: Wound #1  Left,Posterior Lower Leg: ABD pad Dressing Change Frequency: Wound #1 Left,Posterior Lower Leg: Change dressing every week - or as needed Follow-up Appointments: Wound #1 Left,Posterior Lower Leg: Return Appointment in 1 week. Edema Control: Wound #1 Left,Posterior Lower Leg: 3 Layer Compression System - Left Lower Extremity Home Health: Wound #1 Left,Posterior Lower Leg: Continue Home Health Visits - HHRN may change wrap if wrap slips or is uncomfortable Home Health Nurse may visit PRN to address patient s wound care needs. FACE TO FACE ENCOUNTER: MEDICARE and MEDICAID PATIENTS: I certify that this patient is under my care and that I had a face-to-face encounter that meets the physician face-to-face encounter requirements with this patient on this date. The encounter with the patient was in whole or in part for the following MEDICAL CONDITION: (primary reason for Home Healthcare) MEDICAL NECESSITY: I certify, that based on my findings, NURSING services are a medically necessary home health service. HOME BOUND STATUS: I certify that my clinical findings support that this patient is homebound (i.e., Due to illness or injury, pt requires aid of supportive devices such as crutches, cane, wheelchairs, walkers, the use of special transportation or the assistance of another person to leave their place of residence. There is a normal inability to leave the home and doing so requires considerable and taxing effort. Other absences are for medical reasons / religious services and are infrequent or of short duration when for other reasons). ASYIA, HORNUNG ([id number]) If current dressing causes regression in wound condition, may D/C ordered dressing product/s and apply Normal Saline Moist Dressing daily until next Wound Healing Center / Other MD appointment. Notify Wound Healing Center of regression in wound condition at [phone]. Please direct any NON-WOUND related issues/requests for orders to  patient's Primary Care Physician This 81 year old patient who has the underlying problem of chronic kidney disease stage III has had bilateral lower extremity lymphedema and has had no workup for this. I have recommended venous duplex studies of both lower extremities and this appointment is still pending. I have also recommended elevation of the limbs and have discussed this in detail both with the daughter, the patient and the caregiver at the assisted living facility. We will use silver alginate and a Profore lite wrap to start off with. If she tolerates this well we may increase to a 4-layer Profore. She has been asked to come to see as an regular basis and all questions have been answered. Electronic Signature(s) Signed: 06/24/2015 4:43:06 PM By: Evlyn Kanner MD, FACS Previous Signature: 06/24/2015 11:23:25 AM Version By: Evlyn Kanner MD, FACS Entered By: Evlyn Kanner on 06/24/2015 16:43:05 MAKI, HEGE ([id number]) -------------------------------------------------------------------------------- SuperBill Details Patient Name: Heather Zuniga Date of Service: 06/24/2015 Medical Record Number: [id number] Patient Account Number: [id number] Date of Birth/Sex: Dec 24, 1922 (81 y.o. Female) Treating RN: Huel Coventry Primary Care Physician: Bjorn Pippin Other Clinician: Referring Physician: Bjorn Pippin Treating Physician/Extender: Rudene Re  in Treatment: 1 Diagnosis Coding ICD-10 Codes Code Description I89.0 Lymphedema, not elsewhere classified L97.222 Non-pressure chronic ulcer of left calf with fat layer exposed E66.01 Morbid (severe) obesity due to excess calories N18.3 Chronic kidney disease, stage 3 (moderate) Facility Procedures CPT4: Description Modifier Quantity Code [id number] (Facility Use Only) [id number] - APPLY MULTLAY COMPRS LWR LT 1 LEG Physician Procedures CPT4 Code: 3382505 Description: 99213 - WC PHYS LEVEL 3 - EST PT ICD-10 Description Diagnosis I89.0 Lymphedema,  not elsewhere classified L97.222 Non-pressure chronic ulcer of left calf with fat E66.01 Morbid (severe) obesity due to excess calories N18.3 Chronic kidney  disease, stage 3 (moderate) Modifier: layer exposed Quantity: 1 Electronic Signature(s) Signed: 06/24/2015 4:35:06 PM By: Evlyn Kanner MD, FACS Signed: 06/24/2015 5:36:16 PM By: Elliot Gurney RN, BSN, Kim RN, BSN Previous Signature: 06/24/2015 11:23:51 AM Version By: Evlyn Kanner MD, FACS Entered By: Elliot Gurney, RN, BSN, Kim on 06/24/2015 11:34:21

## 2015-06-25 NOTE — Progress Notes (Signed)
CHANDELL, ABBY ([id number]) Visit Report for 06/24/2015 Arrival Information Details Patient Name: Heather Zuniga, Heather Zuniga Date of Service: 06/24/2015 11:00 AM Medical Record Number: [id number] Patient Account Number: [id number] Date of Birth/Sex: 1922/11/09 (81 y.o. Female) Treating RN: Huel Coventry Primary Care Physician: Bjorn Pippin Other Clinician: Referring Physician: Bjorn Pippin Treating Physician/Extender: Rudene Re in Treatment: 1 Visit Information History Since Last Visit Added or deleted any medications: No Patient Arrived: Wheel Chair Any new allergies or adverse reactions: No Arrival Time: 11:03 Had a fall or experienced change in No activities of daily living that may affect Accompanied By: caregiver risk of falls: Transfer Assistance: Manual Signs or symptoms of abuse/neglect since last No Patient Identification Verified: Yes visito Secondary Verification Process Yes Hospitalized since last visit: No Completed: Has Dressing in Place as Prescribed: Yes Patient Has Alerts: Yes Has Compression in Place as Prescribed: Yes Patient Alerts: aspirin 81 Pain Present Now: No Electronic Signature(s) Signed: 06/24/2015 5:36:16 PM By: Elliot Gurney, RN, BSN, Kim RN, BSN Entered By: Elliot Gurney, RN, BSN, Kim on 06/24/2015 11:04:28 Heather Zuniga ([id number]) -------------------------------------------------------------------------------- Encounter Discharge Information Details Patient Name: Heather Zuniga Date of Service: 06/24/2015 11:00 AM Medical Record Number: [id number] Patient Account Number: [id number] Date of Birth/Sex: [birth year] (81 y.o. Female) Treating RN: Huel Coventry Primary Care Physician: Bjorn Pippin Other Clinician: Referring Physician: Bjorn Pippin Treating Physician/Extender: Rudene Re in Treatment: 1 Encounter Discharge Information Items Discharge Pain Level: 0 Discharge Condition: Stable Ambulatory Status: Wheelchair Discharge Destination: Nursing  Home Transportation: Private Auto Accompanied By: caregiver Schedule Follow-up Appointment: Yes Medication Reconciliation completed and provided to Patient/Care Yes Tarrell Debes: Provided on Clinical Summary of Care: 06/24/2015 Form Type Recipient Paper Patient HB Electronic Signature(s) Signed: 06/24/2015 5:36:16 PM By: Elliot Gurney RN, BSN, Kim RN, BSN Previous Signature: 06/24/2015 11:30:23 AM Version By: Gwenlyn Perking Entered By: Elliot Gurney RN, BSN, Kim on 06/24/2015 11:35:18 ASHTAN, PAYNTER ([id number]) -------------------------------------------------------------------------------- Lower Extremity Assessment Details Patient Name: Heather Zuniga Date of Service: 06/24/2015 11:00 AM Medical Record Number: [id number] Patient Account Number: [id number] Date of Birth/Sex: 17-Jun-1923 (81 y.o. Female) Treating RN: Huel Coventry Primary Care Physician: Bjorn Pippin Other Clinician: Referring Physician: Bjorn Pippin Treating Physician/Extender: Rudene Re in Treatment: 1 Edema Assessment Assessed: [Left: No] [Right: No] E[Left: dema] [Right: :] Calf Left: Right: Point of Measurement: 31 cm From Medial Instep 41 cm cm Ankle Left: Right: Point of Measurement: 10 cm From Medial Instep 27.5 cm cm Vascular Assessment Pulses: Posterior Tibial Dorsalis Pedis Palpable: [Left:Yes] Extremity colors, hair growth, and conditions: Extremity Color: [Left:Hyperpigmented] Hair Growth on Extremity: [Left:Yes] Temperature of Extremity: [Left:Warm] Capillary Refill: [Left:< 3 seconds] Toe Nail Assessment Left: Right: Thick: Yes Discolored: Yes Deformed: Yes Improper Length and Hygiene: Yes Electronic Signature(s) Signed: 06/24/2015 5:36:16 PM By: Elliot Gurney, RN, BSN, Kim RN, BSN Entered By: Elliot Gurney, RN, BSN, Kim on 06/24/2015 11:11:53 Heather Zuniga ([id number]) -------------------------------------------------------------------------------- Multi Wound Chart Details Patient Name: Heather Zuniga Date of  Service: 06/24/2015 11:00 AM Medical Record Number: [id number] Patient Account Number: [id number] Date of Birth/Sex: October 10, 1922 (81 y.o. Female) Treating RN: Huel Coventry Primary Care Physician: Bjorn Pippin Other Clinician: Referring Physician: Bjorn Pippin Treating Physician/Extender: Rudene Re in Treatment: 1 Vital Signs Height(in): 60 Pulse(bpm): 68 Weight(lbs): 213 Blood Pressure 142/78 (mmHg): Body Mass Index(BMI): 42 Temperature(F): 97.5 Respiratory Rate 18 (breaths/min): Photos: [1:No Photos] [N/A:N/A] Wound Location: [1:Left Lower Leg - Posterior N/A] Wounding Event: [1:Not Known] [N/A:N/A] Primary Etiology: [1:Venous Leg Ulcer] [N/A:N/A] Comorbid History: [1:Cataracts, Glaucoma, Anemia, Congestive Heart Failure, Osteoarthritis] [N/A:N/A] Date Acquired: [1:06/03/2015] [N/A:N/A] Weeks of  Treatment: [1:1] [N/A:N/A] Wound Status: [1:Open] [N/A:N/A] Measurements L x W x D 4.5x9x0.1 [N/A:N/A] (cm) Area (cm) : [1:31.809] [N/A:N/A] Volume (cm) : [1:3.181] [N/A:N/A] % Reduction in Area: [1:10.00%] [N/A:N/A] % Reduction in Volume: 10.00% [N/A:N/A] Classification: [1:Full Thickness Without Exposed Support Structures] [N/A:N/A] Exudate Amount: [1:Large] [N/A:N/A] Exudate Type: [1:Serous] [N/A:N/A] Exudate Color: [1:amber] [N/A:N/A] Wound Margin: [1:Flat and Intact] [N/A:N/A] Granulation Amount: [1:Large (67-100%)] [N/A:N/A] Granulation Quality: [1:Red] [N/A:N/A] Necrotic Amount: [1:Small (1-33%)] [N/A:N/A] Exposed Structures: [1:Fascia: No Fat: No Tendon: No Muscle: No] [N/A:N/A] Joint: No Bone: No Limited to Skin Breakdown Epithelialization: None N/A N/A Periwound Skin Texture: Edema: No N/A N/A Excoriation: No Induration: No Callus: No Crepitus: No Fluctuance: No Friable: No Rash: No Scarring: No Periwound Skin Dry/Scaly: Yes N/A N/A Moisture: Maceration: No Moist: No Periwound Skin Color: Atrophie Blanche: No N/A N/A Cyanosis: No Ecchymosis:  No Erythema: No Hemosiderin Staining: No Mottled: No Pallor: No Rubor: No Tenderness on Yes N/A N/A Palpation: Wound Preparation: Ulcer Cleansing: N/A N/A Rinsed/Irrigated with Saline Topical Anesthetic Applied: Other: lidocaine 4% Treatment Notes Electronic Signature(s) Signed: 06/24/2015 5:36:16 PM By: Elliot Gurney, RN, BSN, Kim RN, BSN Entered By: Elliot Gurney, RN, BSN, Kim on 06/24/2015 11:33:25 SHANNIN, NAAB ([id number]) -------------------------------------------------------------------------------- Multi-Disciplinary Care Plan Details Patient Name: Heather Zuniga Date of Service: 06/24/2015 11:00 AM Medical Record Number: [id number] Patient Account Number: [id number] Date of Birth/Sex: [birth year] (81 y.o. Female) Treating RN: Huel Coventry Primary Care Physician: Bjorn Pippin Other Clinician: Referring Physician: Bjorn Pippin Treating Physician/Extender: Rudene Re in Treatment: 1 Active Inactive Abuse / Safety / Falls / Self Care Management Nursing Diagnoses: Impaired physical mobility Potential for falls Goals: Patient will remain injury free Date Initiated: 06/17/2015 Goal Status: Active Interventions: Assess fall risk on admission and as needed Notes: Orientation to the Wound Care Program Nursing Diagnoses: Knowledge deficit related to the wound healing center program Goals: Patient/caregiver will verbalize understanding of the Wound Healing Center Program Date Initiated: 06/17/2015 Goal Status: Active Interventions: Provide education on orientation to the wound center Notes: Venous Leg Ulcer Nursing Diagnoses: Potential for venous Insuffiency (use before diagnosis confirmed) Goals: Non-invasive venous studies are completed as ordered NICKOLA, LENIG ([id number]) Date Initiated: 06/17/2015 Goal Status: Active Interventions: Assess peripheral edema status every visit. Notes: Wound/Skin Impairment Nursing Diagnoses: Impaired tissue  integrity Goals: Patient/caregiver will verbalize understanding of skin care regimen Date Initiated: 06/17/2015 Goal Status: Active Ulcer/skin breakdown will have a volume reduction of 30% by week 4 Date Initiated: 06/17/2015 Goal Status: Active Ulcer/skin breakdown will have a volume reduction of 50% by week 8 Date Initiated: 06/17/2015 Goal Status: Active Ulcer/skin breakdown will have a volume reduction of 80% by week 12 Date Initiated: 06/17/2015 Goal Status: Active Ulcer/skin breakdown will heal within 14 weeks Date Initiated: 06/17/2015 Goal Status: Active Interventions: Assess ulceration(s) every visit Notes: Electronic Signature(s) Signed: 06/24/2015 5:36:16 PM By: Elliot Gurney, RN, BSN, Kim RN, BSN Entered By: Elliot Gurney, RN, BSN, Kim on 06/24/2015 11:33:16 Heather Zuniga ([id number]) -------------------------------------------------------------------------------- Pain Assessment Details Patient Name: Heather Zuniga Date of Service: 06/24/2015 11:00 AM Medical Record Number: [id number] Patient Account Number: [id number] Date of Birth/Sex: 12/20/1922 (81 y.o. Female) Treating RN: Huel Coventry Primary Care Physician: Bjorn Pippin Other Clinician: Referring Physician: Bjorn Pippin Treating Physician/Extender: Rudene Re in Treatment: 1 Active Problems Location of Pain Severity and Description of Pain Patient Has Paino No Site Locations Pain Management and Medication Current Pain Management: Electronic Signature(s) Signed: 06/24/2015 5:36:16 PM By: Elliot Gurney, RN, BSN, Kim RN, BSN Entered By: Elliot Gurney, RN, BSN, Kim on 06/24/2015  11:04:42 JANAE, BONSER ([id number]) -------------------------------------------------------------------------------- Patient/Caregiver Education Details Patient Name: LEEASIA, SECRIST Date of Service: 06/24/2015 11:00 AM Medical Record Number: [id number] Patient Account Number: [id number] Date of Birth/Gender: Aug 23, 1922 (81 y.o. Female) Treating RN: Huel Coventry Primary Care Physician: Bjorn Pippin Other Clinician: Referring Physician: Bjorn Pippin Treating Physician/Extender: Rudene Re in Treatment: 1 Education Assessment Education Provided To: Patient Education Topics Provided Wound/Skin Impairment: Handouts: Caring for Your Ulcer Methods: Demonstration, Explain/Verbal Responses: State content correctly Electronic Signature(s) Signed: 06/24/2015 5:36:16 PM By: Elliot Gurney, RN, BSN, Kim RN, BSN Entered By: Elliot Gurney, RN, BSN, Kim on 06/24/2015 11:35:29 DANESHA, KIRCHOFF ([id number]) -------------------------------------------------------------------------------- Wound Assessment Details Patient Name: Heather Zuniga Date of Service: 06/24/2015 11:00 AM Medical Record Number: [id number] Patient Account Number: [id number] Date of Birth/Sex: 1923/03/21 (81 y.o. Female) Treating RN: Huel Coventry Primary Care Physician: Bjorn Pippin Other Clinician: Referring Physician: Bjorn Pippin Treating Physician/Extender: Rudene Re in Treatment: 1 Wound Status Wound Number: 1 Primary Venous Leg Ulcer Etiology: Wound Location: Left Lower Leg - Posterior Wound Open Wounding Event: Not Known Status: Date Acquired: 06/03/2015 Comorbid Cataracts, Glaucoma, Anemia, Weeks Of Treatment: 1 History: Congestive Heart Failure, Clustered Wound: No Osteoarthritis Photos Photo Uploaded By: Elliot Gurney, RN, BSN, Kim on 06/24/2015 17:38:16 Wound Measurements Length: (cm) 4.5 Width: (cm) 9 Depth: (cm) 0.1 Area: (cm) 31.809 Volume: (cm) 3.181 % Reduction in Area: 10% % Reduction in Volume: 10% Epithelialization: None Wound Description Full Thickness Without Exposed Classification: Support Structures Wound Margin: Flat and Intact Exudate Large Amount: Exudate Type: Serous Exudate Color: amber Foul Odor After Cleansing: No Wound Bed Granulation Amount: Large (67-100%) Exposed Structure Granulation Quality: Red Fascia Exposed: No Carrington, Braylie  ([id number]) Necrotic Amount: Small (1-33%) Fat Layer Exposed: No Necrotic Quality: Adherent Slough Tendon Exposed: No Muscle Exposed: No Joint Exposed: No Bone Exposed: No Limited to Skin Breakdown Periwound Skin Texture Texture Color No Abnormalities Noted: No No Abnormalities Noted: No Callus: No Atrophie Blanche: No Crepitus: No Cyanosis: No Excoriation: No Ecchymosis: No Fluctuance: No Erythema: No Friable: No Hemosiderin Staining: No Induration: No Mottled: No Localized Edema: No Pallor: No Rash: No Rubor: No Scarring: No Temperature / Pain Moisture Tenderness on Palpation: Yes No Abnormalities Noted: No Dry / Scaly: Yes Maceration: No Moist: No Wound Preparation Ulcer Cleansing: Rinsed/Irrigated with Saline Topical Anesthetic Applied: Other: lidocaine 4%, Treatment Notes Wound #1 (Left, Posterior Lower Leg) 1. Cleansed with: Clean wound with Normal Saline 2. Anesthetic Topical Lidocaine 4% cream to wound bed prior to debridement 4. Dressing Applied: Aquacel Ag 5. Secondary Dressing Applied ABD Pad 7. Secured with 3 Layer Compression System - Left Lower Extremity Electronic Signature(s) Signed: 06/24/2015 5:36:16 PM By: Elliot Gurney, RN, BSN, Kim RN, BSN Entered By: Elliot Gurney, RN, BSN, Kim on 06/24/2015 11:13:35 NATHALEE, SMARR ([id number]) HEIDE, BROSSART ([id number]) -------------------------------------------------------------------------------- Vitals Details Patient Name: Heather Zuniga Date of Service: 06/24/2015 11:00 AM Medical Record Number: [id number] Patient Account Number: [id number] Date of Birth/Sex: Jun 14, 1923 (81 y.o. Female) Treating RN: Huel Coventry Primary Care Physician: Bjorn Pippin Other Clinician: Referring Physician: Bjorn Pippin Treating Physician/Extender: Rudene Re in Treatment: 1 Vital Signs Time Taken: 11:04 Temperature (F): 97.5 Height (in): 60 Pulse (bpm): 68 Weight (lbs): 213 Respiratory Rate (breaths/min): 18 Body Mass  Index (BMI): 41.6 Blood Pressure (mmHg): 142/78 Reference Range: 80 - 120 mg / dl Electronic Signature(s) Signed: 06/24/2015 5:36:16 PM By: Elliot Gurney, RN, BSN, Kim RN, BSN Entered By: Elliot Gurney, RN, BSN, Kim on 06/24/2015 11:07:13

## 2015-06-27 ENCOUNTER — Encounter: Payer: Self-pay | Admitting: Family Medicine

## 2015-06-27 ENCOUNTER — Ambulatory Visit (INDEPENDENT_AMBULATORY_CARE_PROVIDER_SITE_OTHER): Payer: Medicare Other | Admitting: Family Medicine

## 2015-06-27 VITALS — BP 140/84 | HR 73 | Temp 98.0°F | Resp 16 | Ht 60.0 in | Wt 218.6 lb

## 2015-06-27 DIAGNOSIS — I10 Essential (primary) hypertension: Secondary | ICD-10-CM

## 2015-06-27 DIAGNOSIS — M170 Bilateral primary osteoarthritis of knee: Secondary | ICD-10-CM

## 2015-06-27 DIAGNOSIS — Z8249 Family history of ischemic heart disease and other diseases of the circulatory system: Secondary | ICD-10-CM

## 2015-06-27 DIAGNOSIS — I83029 Varicose veins of left lower extremity with ulcer of unspecified site: Secondary | ICD-10-CM

## 2015-06-27 DIAGNOSIS — N183 Chronic kidney disease, stage 3 unspecified: Secondary | ICD-10-CM

## 2015-06-27 DIAGNOSIS — L97929 Non-pressure chronic ulcer of unspecified part of left lower leg with unspecified severity: Secondary | ICD-10-CM

## 2015-06-27 DIAGNOSIS — R609 Edema, unspecified: Secondary | ICD-10-CM

## 2015-06-27 LAB — HEPATIC FUNCTION PANEL
ALK PHOS: 96 U/L (ref 25–125)
ALT: 24 U/L (ref 7–35)
AST: 14 U/L (ref 13–35)

## 2015-06-27 LAB — LIPID PANEL
Cholesterol: 154 mg/dL (ref 0–200)
HDL: 55 mg/dL (ref 35–70)
LDL Cholesterol: 73 mg/dL

## 2015-06-27 LAB — BASIC METABOLIC PANEL
Creatinine: 1.4 mg/dL — AB (ref <=1.1)
GLUCOSE: 61 mg/dL
Potassium: 5.2 mmol/L (ref 3.4–5.3)

## 2015-06-27 NOTE — Assessment & Plan Note (Signed)
+  2 pitting. Continue bilateral ace wraps. Encouraged to elevate LE. Continue diuretics.

## 2015-06-27 NOTE — Patient Instructions (Signed)
Continue to go to Wound center and vein and vascular for your legs.  Please call if you have increased swelling, fevers, pain, or the extremity is cold or numb.

## 2015-06-27 NOTE — Progress Notes (Signed)
Subjective:    Patient ID: Heather Zuniga, female    DOB: November 04, 1922, 81 y.o.   MRN: [id number]  HPI: Heather Zuniga is a 81 y.o. female presenting on 06/27/2015 for fl2 forms; Edema; and Osteoarthritis   Arthritis Presents for follow-up visit. She complains of pain (bilateral knees. ). Affected locations include the right knee and left knee. Her pain is at a severity of 4/10. Pertinent negatives include no diarrhea, dysuria, fever, pain while resting, rash, Raynaud's syndrome or weight loss. Her past medical history is significant for osteoarthritis.    Pt presents for FL2 forms. Overall doing well at home.  Edema remains unchanged. On Spironolactone and torsemide for swelling. ACE wraps bilateral legs. Remains stable.  Venous stasis ulcer- Will see vein and vascular in December for vascular evaulation. Seeing wound clinic for leg ulcer.   Pt has not had labwork done. Requested that home health RN complete labwork.   Unsure of flu shot needs documentation.    Past Medical History  Diagnosis Date  . GERD (gastroesophageal reflux disease)   . Anemia   . Arthritis   . Pyuria   . Edema   . Depression     Current Outpatient Prescriptions on File Prior to Visit  Medication Sig  . Acetaminophen (TYLENOL ARTHRITIS EXT RELIEF PO) Take 2 tablets by mouth 2 (two) times daily.  . ferrous sulfate 325 (65 FE) MG tablet TAKE 1 TABLET BY MOUTH ONCE DAILY  . omeprazole (PRILOSEC) 20 MG capsule Take 20 mg by mouth daily.   Bertram Gala Glycol-Propyl Glycol (SYSTANE) 0.4-0.3 % SOLN Apply 1 drop to eye 2 (two) times daily.  . potassium chloride (K-DUR,KLOR-CON) 10 MEQ tablet Take 10 mEq by mouth daily.   Marland Kitchen spironolactone (ALDACTONE) 25 MG tablet Take 25 mg by mouth daily.   Marland Kitchen torsemide (DEMADEX) 20 MG tablet    No current facility-administered medications on file prior to visit.    Review of Systems  Constitutional: Negative for fever, chills and weight loss.  HENT: Negative.   Respiratory:  Negative for cough, chest tightness and wheezing.   Cardiovascular: Positive for leg swelling. Negative for chest pain.  Gastrointestinal: Negative for nausea, vomiting, abdominal pain, diarrhea and constipation.  Endocrine: Negative.  Negative for cold intolerance, heat intolerance, polydipsia, polyphagia and polyuria.  Genitourinary: Negative for dysuria and difficulty urinating.  Musculoskeletal: Positive for arthritis.  Skin: Negative for rash.  Neurological: Negative for dizziness, light-headedness and numbness.  Psychiatric/Behavioral: Negative.    Per HPI unless specifically indicated above     Objective:    BP 140/84 mmHg  Pulse 73  Temp(Src) 98 F (36.7 C) (Oral)  Resp 16  Ht 5' (1.524 m)  Wt 218 lb 9.6 oz (99.156 kg)  BMI 42.69 kg/m2  Wt Readings from Last 3 Encounters:  06/27/15 218 lb 9.6 oz (99.156 kg)  06/09/15 213 lb 6.4 oz (96.798 kg)    Physical Exam  Constitutional: She is oriented to person, place, and time. She appears well-developed and well-nourished.  HENT:  Head: Normocephalic and atraumatic.  Neck: Neck supple.  Cardiovascular: Normal rate, regular rhythm and normal heart sounds.  Exam reveals no gallop and no friction rub.   No murmur heard. Pulmonary/Chest: Effort normal and breath sounds normal. She has no wheezes. She exhibits no tenderness.  Abdominal: Soft. Normal appearance and bowel sounds are normal. She exhibits no distension and no mass. There is no tenderness. There is no rebound and no guarding.  Musculoskeletal: Normal range of motion.  She exhibits edema (BLE +2). She exhibits no tenderness.  Lymphadenopathy:    She has no cervical adenopathy.  Neurological: She is alert and oriented to person, place, and time.  Skin: Skin is warm and dry.  LLE oozing venous stasis ulcer.    Results for orders placed or performed in visit on 01/27/13  CBC  Result Value Ref Range   WBC 5.3 3.6-11.0 x10 3/mm 3   RBC 3.08 (L) 3.80-5.20 X10 6/mm 3    HGB 10.3 (L) 12.0-16.0 g/dL   HCT 56.2 (L) 13.0-86.5 %   MCV 97 80-100 fL   MCH 33.4 26.0-34.0 pg   MCHC 34.6 32.0-36.0 g/dL   RDW 78.4 (H) 69.6-29.5 %   Platelet 221 150-440 x10 3/mm 3      Assessment & Plan:   Problem List Items Addressed This Visit      Musculoskeletal and Integument   Arthritis, degenerative     Genitourinary   Chronic kidney disease (CKD), stage III (moderate) - Primary    Check CMP today to monitor kidney function. Advised to avoid NSAIDs. Consider nephrology referral if progressing.       Relevant Orders   Comprehensive Metabolic Panel (CMET)     Other   Edema    +2 pitting. Continue bilateral ace wraps. Encouraged to elevate LE. Continue diuretics.       Stasis leg ulcer (HCC)    Managed by wound care. Will get vein and vascular consult.        Other Visit Diagnoses    Family history of heart disease        Relevant Orders    Lipid Profile    Essential hypertension        Stable. Check CMP, lipids today.     Relevant Orders    Lipid Profile       Meds ordered this encounter  Medications  . latanoprost (XALATAN) 0.005 % ophthalmic solution    Sig:       Follow up plan: Return in about 6 months (around 12/25/2015) for CKD follow-up. Marland Kitchen

## 2015-06-27 NOTE — Assessment & Plan Note (Signed)
Check CMP today to monitor kidney function. Advised to avoid NSAIDs. Consider nephrology referral if progressing.

## 2015-06-27 NOTE — Assessment & Plan Note (Signed)
Managed by wound care. Will get vein and vascular consult.

## 2015-07-01 ENCOUNTER — Ambulatory Visit: Payer: Medicare Other | Admitting: Surgery

## 2015-07-02 ENCOUNTER — Encounter: Payer: Self-pay | Admitting: Family Medicine

## 2015-07-14 ENCOUNTER — Encounter: Payer: Medicare Other | Attending: Surgery | Admitting: Surgery

## 2015-07-14 DIAGNOSIS — I890 Lymphedema, not elsewhere classified: Secondary | ICD-10-CM | POA: Diagnosis not present

## 2015-07-14 DIAGNOSIS — F329 Major depressive disorder, single episode, unspecified: Secondary | ICD-10-CM | POA: Insufficient documentation

## 2015-07-14 DIAGNOSIS — N183 Chronic kidney disease, stage 3 (moderate): Secondary | ICD-10-CM | POA: Diagnosis not present

## 2015-07-14 DIAGNOSIS — K219 Gastro-esophageal reflux disease without esophagitis: Secondary | ICD-10-CM | POA: Insufficient documentation

## 2015-07-14 DIAGNOSIS — M1990 Unspecified osteoarthritis, unspecified site: Secondary | ICD-10-CM | POA: Insufficient documentation

## 2015-07-14 DIAGNOSIS — I509 Heart failure, unspecified: Secondary | ICD-10-CM | POA: Diagnosis not present

## 2015-07-14 DIAGNOSIS — H5441 Blindness, right eye, normal vision left eye: Secondary | ICD-10-CM | POA: Diagnosis not present

## 2015-07-14 DIAGNOSIS — L97222 Non-pressure chronic ulcer of left calf with fat layer exposed: Secondary | ICD-10-CM | POA: Diagnosis not present

## 2015-07-14 DIAGNOSIS — D649 Anemia, unspecified: Secondary | ICD-10-CM | POA: Diagnosis not present

## 2015-07-14 NOTE — Progress Notes (Addendum)
Heather Zuniga ([id number]) Visit Report for 07/14/2015 Chief Complaint Document Details Patient Name: Heather Zuniga Date of Service: 07/14/2015 9:30 AM Medical Record Number: [id number] Patient Account Number: [id number] Date of Birth/Sex: [birth year] (81 y.o. Female) Treating RN: Heather Zuniga Primary Care Physician: Heather Zuniga Other Clinician: Referring Physician: Bjorn Zuniga Treating Physician/Extender: Heather Zuniga in Treatment: 3 Information Obtained from: Patient Chief Complaint Patient seen for complaints of Non-Healing Wound. this 81 year old patient has had bilateral lower extremity swelling for over 3 years and now has had an open ulceration on the posterior part of the left leg for several months. Electronic Signature(s) Signed: 07/14/2015 10:12:04 AM By: Heather Zuniga Entered By: Heather Kanner on 07/14/2015 10:12:04 Heather Zuniga ([id number]) -------------------------------------------------------------------------------- HPI Details Patient Name: Heather Zuniga Date of Service: 07/14/2015 9:30 AM Medical Record Number: [id number] Patient Account Number: [id number] Date of Birth/Sex: 06-15-1923 (81 y.o. Female) Treating RN: Heather Zuniga Primary Care Physician: Heather Zuniga Other Clinician: Referring Physician: Bjorn Zuniga Treating Physician/Extender: Heather Zuniga in Treatment: 3 History of Present Illness Location: ulcerated area on the left posterior leg. She also has bilateral lower extremity swelling Quality: Patient reports experiencing a dull pain to affected area(s). Severity: Patient states wound are getting worse. Duration: Patient has had the wound for > 3 months prior to seeking treatment at the wound center Timing: Pain in wound is Intermittent (comes and goes Context: The wound appeared gradually over time Modifying Factors: Other treatment(s) tried include: she has had doxycycline and a compression ace wrap applied Associated Signs and  Symptoms: Patient reports having increase swelling. HPI Description: 81 year old patient sent by her PCP Heather Zuniga for a stasis ulcer on her left lower leg which she's had for many months. As indicated to him recently with leg swelling and oozing. A past medical history significant for GERD, chronic kidney disease stage III,anemia, arthritis, pyuria, edema and depression. She has never been a smoker. On clinical examination he found that she had a left lower leg ulceration with oozing and was tender to palpation with some redness and +3 edema. He put her on doxycycline and referred her to the wound clinic. 07/14/2015 -- she has not been here to see as Zuniga the last 3 weeks but I understand they have been doing her compression wraps at the facility. Her venous duplex study is scheduled for December 8. Electronic Signature(s) Signed: 07/14/2015 10:12:39 AM By: Heather Zuniga Entered By: Heather Kanner on 07/14/2015 10:12:39 Heather Zuniga, Heather Zuniga ([id number]) -------------------------------------------------------------------------------- Physical Exam Details Patient Name: Heather Zuniga Date of Service: 07/14/2015 9:30 AM Medical Record Number: [id number] Patient Account Number: [id number] Date of Birth/Sex: Oct 01, 1922 (81 y.o. Female) Treating RN: Heather Zuniga Primary Care Physician: Heather Zuniga Other Clinician: Referring Physician: Bjorn Zuniga Treating Physician/Extender: Heather Zuniga in Treatment: 3 Constitutional . Pulse regular. Respirations normal and unlabored. Afebrile. . Eyes Nonicteric. Reactive to light. Ears, Nose, Mouth, and Throat Lips, teeth, and gums WNL.Marland Kitchen Moist mucosa without lesions . Neck supple and nontender. No palpable supraclavicular or cervical adenopathy. Normal sized without goiter. Respiratory WNL. No retractions.. Cardiovascular Pedal Pulses WNL. No clubbing, cyanosis or edema. Gastrointestinal (GI) Abdomen without masses or tenderness.. No  liver or spleen enlargement or tenderness.. Genitourinary (GU) No hydrocele, spermatocele, tenderness of the cord, or testicular mass.Marland Kitchen Penis without lesions.Renetta Chalk without lesions. No cystocele, or rectocele. Pelvic support intact, no discharge. Marland Kitchen Urethra without masses, tenderness or scarring.Marland Kitchen Lymphatic No adneopathy. No adenopathy. No adenopathy. Musculoskeletal Adexa without tenderness or enlargement.. Digits and nails w/o  clubbing, cyanosis, infection, petechiae, ischemia, or inflammatory conditions.. Integumentary (Hair, Skin) No suspicious lesions. No crepitus or fluctuance. No peri-wound warmth or erythema. No masses.Marland Kitchen Psychiatric Judgement and insight Intact.. No evidence of depression, anxiety, or agitation.. Notes the ulceration has completely healed but she still has stage II lymphedema. Electronic Signature(s) Signed: 07/14/2015 10:13:15 AM By: Heather Zuniga Entered By: Heather Kanner on 07/14/2015 10:13:15 Heather Zuniga, Heather Zuniga ([id number]) [name], [name] ([id number]) -------------------------------------------------------------------------------- Physician Orders Details Patient Name: Heather Zuniga Date of Service: 07/14/2015 9:30 AM Medical Record Number: [id number] Patient Account Number: [id number] Date of Birth/Sex: Apr 23, 1923 (81 y.o. Female) Treating RN: Heather Zuniga Primary Care Physician: Heather Zuniga Other Clinician: Referring Physician: Bjorn Zuniga Treating Physician/Extender: Heather Zuniga in Treatment: 3 Verbal / Phone Orders: Yes Clinician: Leonard Zuniga Read Back and Verified: No Diagnosis Coding Wound Cleansing Wound #1 Left,Posterior Lower Leg o Clean wound with Normal Saline. Anesthetic Wound #1 Left,Posterior Lower Leg o Topical Lidocaine 4% cream applied to wound bed prior to debridement Primary Wound Dressing Wound #1 Left,Posterior Lower Leg o Aquacel Ag Secondary Dressing Wound #1 Left,Posterior Lower Leg o ABD  pad Dressing Change Frequency Wound #1 Left,Posterior Lower Leg o Change dressing every week - or as needed Follow-up Appointments Wound #1 Left,Posterior Lower Leg o Return Appointment in 1 week. Edema Control Wound #1 Left,Posterior Lower Leg o 3 Layer Compression System - Left Lower Extremity o Support Garment 20-30 mm/Hg pressure to: - measure and order garments Home Health Wound #1 Left,Posterior Lower Leg o Continue Home Health Visits - HHRN may change wrap if wrap slips or is uncomfortable o Home Health Nurse may visit PRN to address patientos wound care needs. o FACE TO FACE ENCOUNTER: MEDICARE and MEDICAID PATIENTS: I certify that this patient is under my care and that I had a face-to-face encounter that meets the physician face-to-face IMO, Heather Zuniga ([id number]) encounter requirements with this patient on this date. The encounter with the patient was in whole or in part for the following MEDICAL CONDITION: (primary reason for Home Healthcare) MEDICAL NECESSITY: I certify, that based on my findings, NURSING services are a medically necessary home health service. HOME BOUND STATUS: I certify that my clinical findings support that this patient is homebound (i.e., Due to illness or injury, pt requires aid of supportive devices such as crutches, cane, wheelchairs, walkers, the use of special transportation or the assistance of another person to leave their place of residence. There is a normal inability to leave the home and doing so requires considerable and taxing effort. Other absences are for medical reasons / religious services and are infrequent or of short duration when for other reasons). o If current dressing causes regression in wound condition, may D/C ordered dressing product/s and apply Normal Saline Moist Dressing daily until next Wound Healing Center / Other MD appointment. Notify Wound Healing Center of regression in wound condition at  [phone]. o Please direct any NON-WOUND related issues/requests for orders to patient's Primary Care Physician Electronic Signature(s) Signed: 07/14/2015 4:20:52 PM By: Maureen Chatters Signed: 07/14/2015 4:22:10 PM By: Heather Zuniga Entered By: Lucrezia Starch RN, Sendra on 07/14/2015 10:23:55 Heather Zuniga, Heather Zuniga ([id number]) -------------------------------------------------------------------------------- Problem List Details Patient Name: Heather Zuniga Date of Service: 07/14/2015 9:30 AM Medical Record Number: [id number] Patient Account Number: [id number] Date of Birth/Sex: 1923/03/02 (81 y.o. 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/17/2015 Yes exposed E66.01 Morbid (severe) obesity due to excess calories 06/17/2015 Yes N18.3 Chronic kidney disease, stage 3 (moderate) 06/17/2015 Yes Inactive Problems Resolved Problems Electronic Signature(s) Signed: 07/14/2015 10:11:57 AM By: Heather Zuniga Entered By: Heather Kanner on 07/14/2015 10:11:57 Heather Zuniga ([id number]) -------------------------------------------------------------------------------- Progress Note Details Patient Name: Heather Zuniga Date of Service: 07/14/2015 9:30 AM Medical Record Number: [id number] Patient Account Number: [id number] Date of Birth/Sex: 1922-11-06 (81 y.o. Female) Treating RN: Heather Zuniga Primary Care Physician: Heather Zuniga Other Clinician: Referring Physician: Bjorn Zuniga Treating Physician/Extender: Heather Zuniga in Treatment: 3 Subjective Chief Complaint Information obtained from Patient Patient seen for complaints of Non-Healing Wound.  this 81 year old patient has had bilateral lower extremity swelling for over 3 years and now has had an open ulceration on the posterior part of the left leg for several months. History of Present Illness (HPI) The following HPI elements were documented for the patient's wound: Location: ulcerated area on the left posterior leg. She also has bilateral lower extremity swelling Quality: Patient reports experiencing a dull pain to affected area(s). Severity: Patient states wound are getting worse. Duration: Patient has had the wound for > 3 months prior to seeking treatment at the wound center Timing: Pain in wound is Intermittent (comes and goes Context: The wound appeared gradually over time Modifying Factors: Other treatment(s) tried include: she has had doxycycline and a compression ace wrap applied Associated Signs and Symptoms: Patient reports having increase swelling. 81 year old patient sent by her PCP Heather Zuniga for a stasis ulcer on her left lower leg which she's had for many months. As indicated to him recently with leg swelling and oozing. A past medical history significant for GERD, chronic kidney disease stage III,anemia, arthritis, pyuria, edema and depression. She has never been a smoker. On clinical examination he found that she had a left lower leg ulceration with oozing and was tender to palpation with some redness and +3 edema. He put her on doxycycline and referred her to the wound clinic. 07/14/2015 -- she has not been here to see as Zuniga the last 3 weeks but I understand they have been doing her compression wraps at the facility. Her venous duplex study is scheduled for December 8. Objective Constitutional Pulse regular. Respirations normal and unlabored. Afebrile. Heather Zuniga, Heather Zuniga ([id number]) Vitals Time Taken: 9:43 AM, Height: 60 in, Weight: 213 lbs, BMI: 41.6, Temperature: 97.4 F, Pulse: 98 bpm, Respiratory Rate: 17 breaths/min, Blood Pressure: 138/61 mmHg, Pulse  Oximetry: 98 %. Eyes Nonicteric. Reactive to light. Ears, Nose, Mouth, and Throat Lips, teeth, and gums WNL.Marland Kitchen Moist mucosa without lesions . Neck supple and nontender. No palpable supraclavicular or cervical adenopathy. Normal sized without goiter. Respiratory WNL. No retractions.. Cardiovascular Pedal Pulses WNL. No clubbing, cyanosis or edema. Gastrointestinal (GI) Abdomen without masses or tenderness.. No liver or spleen enlargement or tenderness.. Genitourinary (GU) No hydrocele, spermatocele, tenderness of the cord, or testicular mass.Marland Kitchen Penis without lesions.Renetta Chalk without lesions. No cystocele, or rectocele. Pelvic support intact, no discharge. Marland Kitchen Urethra without masses, tenderness or scarring.Marland Kitchen Lymphatic No adneopathy. No adenopathy. No adenopathy. Musculoskeletal Adexa without tenderness or enlargement.. Digits and nails w/o clubbing, cyanosis, infection, petechiae, ischemia, or inflammatory conditions.Marland Kitchen Psychiatric Judgement and insight Intact.. No evidence of depression, anxiety, or agitation.. General Notes: the ulceration has completely healed but she still has stage II lymphedema. Integumentary (Hair, Skin) No suspicious lesions. No crepitus or  fluctuance. No peri-wound warmth or erythema. No masses.. Wound #1 status is Open. Original cause of wound was Not Known. The wound is located on the Left,Posterior Lower Leg. The wound measures 0.2cm length x 0.2cm width x 0.1cm depth; 0.031cm^2 area and 0.003cm^3 volume. The wound is limited to skin breakdown. There is a large amount of serous drainage noted. The wound margin is flat and intact. There is large (67-100%) red granulation within the wound bed. There is a small (1-33%) amount of necrotic tissue within the wound bed including Adherent Slough. The periwound skin appearance exhibited: Dry/Scaly. The periwound skin appearance did not exhibit: Callus, Crepitus, Excoriation, Fluctuance, Friable, Induration, Localized  Edema, Rash, Scarring, Maceration, Moist, Atrophie Blanche, Cyanosis, Ecchymosis, Hemosiderin Staining, Mottled, Pallor, Rubor, Snedden, Heather Zuniga ([id number]) Erythema. The periwound has tenderness on palpation. Assessment Active Problems ICD-10 I89.0 - Lymphedema, not elsewhere classified L97.222 - Non-pressure chronic ulcer of left calf with fat layer exposed E66.01 - Morbid (severe) obesity due to excess calories N18.3 - Chronic kidney disease, stage 3 (moderate) I have recommended venous duplex studies of both lower extremities and this appointment is today for December 8. I have also recommended elevation of the limbs and have discussed this in detail both with the patient and the caregiver at the assisted living facility. We will use a Profore lite wrap and she is tolerating this Zuniga. We will also order her some 20-30 mm compression stockings so that we can continue with compression once her vascular tests are done. We will review her back with her vascular reports. Plan Wound Cleansing: Wound #1 Left,Posterior Lower Leg: Clean wound with Normal Saline. Anesthetic: Wound #1 Left,Posterior Lower Leg: Topical Lidocaine 4% cream applied to wound bed prior to debridement Primary Wound Dressing: Wound #1 Left,Posterior Lower Leg: Aquacel Ag Secondary Dressing: Wound #1 Left,Posterior Lower Leg: ABD pad Dressing Change Frequency: Wound #1 Left,Posterior Lower Leg: Change dressing every week - or as needed Follow-up Appointments: Wound #1 Left,Posterior Lower Leg: Return Appointment in 1 week. Edema Control: Heather Zuniga, Heather Zuniga ([id number]) Wound #1 Left,Posterior Lower Leg: 3 Layer Compression System - Left Lower Extremity Support Garment 20-30 mm/Hg pressure to: - measure and order garments Home Health: Wound #1 Left,Posterior Lower Leg: Continue Home Health Visits - HHRN may change wrap if wrap slips or is uncomfortable Home Health Nurse may visit PRN to address patient s wound  care needs. FACE TO FACE ENCOUNTER: MEDICARE and MEDICAID PATIENTS: I certify that this patient is under my care and that I had a face-to-face encounter that meets the physician face-to-face encounter requirements with this patient on this date. The encounter with the patient was in whole or in part for the following MEDICAL CONDITION: (primary reason for Home Healthcare) MEDICAL NECESSITY: I certify, that based on my findings, NURSING services are a medically necessary home health service. HOME BOUND STATUS: I certify that my clinical findings support that this patient is homebound (i.e., Due to illness or injury, pt requires aid of supportive devices such as crutches, cane, wheelchairs, walkers, the use of special transportation or the assistance of another person to leave their place of residence. There is a normal inability to leave the home and doing so requires considerable and taxing effort. Other absences are for medical reasons / religious services and are infrequent or of short duration when for other reasons). If current dressing causes regression in wound condition, may D/C ordered dressing product/s and apply Normal Saline Moist Dressing daily until next Wound Healing Center / Other MD  appointment. Notify Wound Healing Center of regression in wound condition at [phone]. Please direct any NON-WOUND related issues/requests for orders to patient's Primary Care Physician I have recommended venous duplex studies of both lower extremities and this appointment is today for December 8. I have also recommended elevation of the limbs and have discussed this in detail both with the patient and the caregiver at the assisted living facility. We will use a Profore lite wrap and she is tolerating this Zuniga. We will also order her some 20-30 mm compression stockings so that we can continue with compression once her vascular tests are done. We will review her back with her vascular  reports. Electronic Signature(s) Signed: 07/17/2015 3:48:25 PM By: Heather Zuniga Previous Signature: 07/17/2015 3:48:07 PM Version By: Heather Zuniga Previous Signature: 07/16/2015 3:52:18 PM Version By: Heather Zuniga Previous Signature: 07/14/2015 4:23:54 PM Version By: Heather Zuniga Previous Signature: 07/14/2015 10:15:16 AM Version By: Heather Zuniga Entered By: Heather Kanner on 07/17/2015 15:48:24 Heather Zuniga, Heather Zuniga ([id number]) -------------------------------------------------------------------------------- SuperBill Details Patient Name: Heather Zuniga Date of Service: 07/14/2015 Medical Record Number: [id number] Patient Account Number: [id number] Date of Birth/Sex: 09/04/1922 (81 y.o. Female) Treating RN: Heather Zuniga Primary Care Physician: Heather Zuniga Other Clinician: Referring Physician: Bjorn Zuniga Treating Physician/Extender: Heather Zuniga in Treatment: 3 Diagnosis Coding ICD-10 Codes Code Description I89.0 Lymphedema, not elsewhere classified L97.222 Non-pressure chronic ulcer of left calf with fat layer exposed E66.01 Morbid (severe) obesity due to excess calories N18.3 Chronic kidney disease, stage 3 (moderate) Facility Procedures CPT4: Description Modifier Quantity Code [id number] (Facility Use Only) [phone] - APPLY MULTLAY COMPRS LWR LT 1 LEG Physician Procedures CPT4 Code: [id number] Description: 99213 - WC PHYS LEVEL 3 - EST PT ICD-10 Description Diagnosis I89.0 Lymphedema, not elsewhere classified L97.222 Non-pressure chronic ulcer of left calf with fat E66.01 Morbid (severe) obesity due to excess calories N18.3 Chronic kidney  disease, stage 3 (moderate) Modifier: layer exposed Quantity: 1 Electronic Signature(s) Signed: 07/14/2015 4:20:52 PM By: Maureen Chatters Signed: 07/14/2015 4:22:10 PM By: Heather Zuniga Previous Signature: 07/14/2015 10:15:30 AM Version By: Heather Zuniga Entered By: Lucrezia Starch RN,  Sendra on 07/14/2015 10:38:53

## 2015-07-14 NOTE — Progress Notes (Addendum)
GERRI, ACRE ([id number]) Visit Report for 07/14/2015 Arrival Information Details Patient Name: Heather Zuniga, Heather Zuniga Date of Service: 07/14/2015 9:30 AM Medical Record Number: [id number] Patient Account Number: [id number] Date of Birth/Sex: 1923/04/14 (81 y.o. Female) Treating RN: Leonard Downing Primary Care Physician: Bjorn Pippin Other Clinician: Referring Physician: Bjorn Pippin Treating Physician/Extender: Rudene Re in Treatment: 3 Visit Information History Since Last Visit All ordered tests and consults were completed: No Patient Arrived: Wheel Chair Added or deleted any medications: No Arrival Time: 09:41 Any new allergies or adverse reactions: No Accompanied By: caregiver Had a fall or experienced change in No activities of daily living that may affect Transfer Assistance: Manual risk of falls: Patient Identification Verified: Yes Signs or symptoms of abuse/neglect since last No Secondary Verification Process Yes visito Completed: Hospitalized since last visit: No Patient Has Alerts: Yes Has Dressing in Place as Prescribed: Yes Patient Alerts: aspirin 81 Has Compression in Place as Prescribed: Yes Pain Present Now: No Electronic Signature(s) Signed: 07/14/2015 4:20:52 PM By: Lucrezia Starch, RN, Sendra Entered By: Lucrezia Starch RN, Sendra on 07/14/2015 09:42:36 Heather Zuniga ([id number]) -------------------------------------------------------------------------------- Encounter Discharge Information Details Patient Name: Heather Zuniga Date of Service: 07/14/2015 9:30 AM Medical Record Number: [id number] Patient Account Number: [id number] Date of Birth/Sex: 1922-10-22 (81 y.o. Female) Treating RN: Leonard Downing Primary Care Physician: Bjorn Pippin Other Clinician: Referring Physician: Bjorn Pippin Treating Physician/Extender: Rudene Re in Treatment: 3 Encounter Discharge Information Items Facility Notification Discharge Pain Level: 0 Facility Type: Home  Health Discharge Condition: Stable Orders Sent: Yes Ambulatory Status: Wheelchair Discharge Destination: Nursing Home Transportation: Private Auto Accompanied By: caregiver Schedule Follow-up Appointment: No Medication Reconciliation completed and provided to Patient/Care Yes Jed Kutch: Provided on Clinical Summary of Care: 07/14/2015 Form Type Recipient Paper Patient HB Electronic Signature(s) Signed: 07/14/2015 4:20:52 PM By: Lucrezia Starch RN, Rosalio Macadamia Previous Signature: 07/14/2015 10:25:16 AM Version By: Gwenlyn Perking Entered By: Lucrezia Starch RN, Sendra on 07/14/2015 10:39:55 Heather Zuniga ([id number]) -------------------------------------------------------------------------------- Lower Extremity Assessment Details Patient Name: Heather Zuniga Date of Service: 07/14/2015 9:30 AM Medical Record Number: [id number] Patient Account Number: [id number] Date of Birth/Sex: 1923/01/06 (81 y.o. Female) Treating RN: Leonard Downing Primary Care Physician: Bjorn Pippin Other Clinician: Referring Physician: Bjorn Pippin Treating Physician/Extender: Rudene Re in Treatment: 3 Edema Assessment Assessed: [Left: No] [Right: No] E[Left: dema] [Right: :] Calf Left: Right: Point of Measurement: 31 cm From Medial Instep 49.7 cm cm Ankle Left: Right: Point of Measurement: 10 cm From Medial Instep 28.4 cm cm Vascular Assessment Pulses: Posterior Tibial Dorsalis Pedis Palpable: [Left:Yes] Extremity colors, hair growth, and conditions: Hair Growth on Extremity: [Left:No] Temperature of Extremity: [Left:Cool] Capillary Refill: [Left:> 3 seconds] Toe Nail Assessment Left: Right: Thick: Yes Discolored: No Deformed: No Improper Length and Hygiene: Yes Electronic Signature(s) Signed: 07/14/2015 4:20:52 PM By: Lucrezia Starch, RN, Sendra Entered By: Lucrezia Starch RN, Sendra on 07/14/2015 10:01:46 Heather Zuniga  ([id number]) -------------------------------------------------------------------------------- Multi Wound Chart Details Patient Name: Heather Zuniga Date of Service: 07/14/2015 9:30 AM Medical Record Number: [id number] Patient Account Number: [id number] Date of Birth/Sex: Dec 04, 1922 (81 y.o. Female) Treating RN: Leonard Downing Primary Care Physician: Bjorn Pippin Other Clinician: Referring Physician: Bjorn Pippin Treating Physician/Extender: Rudene Re in Treatment: 3 Vital Signs Height(in): 60 Pulse(bpm): 98 Weight(lbs): 213 Blood Pressure 138/61 (mmHg): Body Mass Index(BMI): 42 Temperature(F): 97.4 Respiratory Rate 17 (breaths/min): Wound Assessments Treatment Notes Electronic Signature(s) Signed: 07/14/2015 4:20:52 PM By: Lucrezia Starch RN, Sendra Entered By: Lucrezia Starch RN, Sendra on 07/14/2015 10:03:42 Heather Zuniga ([id number]) -------------------------------------------------------------------------------- Multi-Disciplinary Care Plan Details Patient Name: Heather Zuniga Date of Service:  07/14/2015 9:30 AM Medical Record Number: [id number] Patient Account Number: [id number] Date of Birth/Sex: 1922/08/15 (81 y.o. Female) Treating RN: Leonard Downing Primary Care Physician: Bjorn Pippin Other Clinician: Referring Physician: Bjorn Pippin Treating Physician/Extender: Rudene Re in Treatment: 3 Active Inactive Electronic Signature(s) Signed: 07/18/2015 3:49:12 PM By: Elliot Gurney RN, BSN, Kim RN, BSN Signed: 08/07/2015 4:17:44 PM By: Lucrezia Starch RN, Sendra Previous Signature: 07/14/2015 4:20:52 PM Version By: Lucrezia Starch RN, Lennice Sites By: Elliot Gurney RN, BSN, Kim on 07/17/2015 12:40:30 Heather Zuniga, Heather Zuniga ([id number]) -------------------------------------------------------------------------------- Pain Assessment Details Patient Name: Heather Zuniga Date of Service: 07/14/2015 9:30 AM Medical Record Number: [id number] Patient Account Number: [id number] Date of Birth/Sex: 1923/01/02 (81  y.o. Female) Treating RN: Leonard Downing Primary Care Physician: Bjorn Pippin Other Clinician: Referring Physician: Bjorn Pippin Treating Physician/Extender: Rudene Re in Treatment: 3 Active Problems Location of Pain Severity and Description of Pain Patient Has Paino No Site Locations Rate the pain. Current Pain Level: 0 Pain Management and Medication Current Pain Management: Electronic Signature(s) Signed: 07/14/2015 4:20:52 PM By: Lucrezia Starch, RN, Sendra Entered By: Lucrezia Starch RN, Sendra on 07/14/2015 09:42:47 Heather Zuniga, Heather Zuniga ([id number]) -------------------------------------------------------------------------------- Patient/Caregiver Education Details Patient Name: Heather Zuniga Date of Service: 07/14/2015 9:30 AM Medical Record Number: [id number] Patient Account Number: [id number] Date of Birth/Gender: 1922/12/03 (81 y.o. Female) Treating RN: Leonard Downing Primary Care Physician: Bjorn Pippin Other Clinician: Referring Physician: Bjorn Pippin Treating Physician/Extender: Rudene Re in Treatment: 3 Education Assessment Education Provided To: Patient Education Topics Provided Wound/Skin Impairment: Handouts: Other: lymphedema Methods: Explain/Verbal Responses: State content correctly Electronic Signature(s) Signed: 07/14/2015 4:20:52 PM By: Lucrezia Starch RN, Sendra Entered By: Lucrezia Starch RN, Sendra on 07/14/2015 10:04:35 Heather Zuniga ([id number]) -------------------------------------------------------------------------------- Wound Assessment Details Patient Name: Heather Zuniga Date of Service: 07/14/2015 9:30 AM Medical Record Number: [id number] Patient Account Number: [id number] Date of Birth/Sex: 1923-06-07 (81 y.o. Female) Treating RN: Afful, RN, BSN, Power Sink Primary Care Physician: Bjorn Pippin Other Clinician: Referring Physician: Bjorn Pippin Treating Physician/Extender: Rudene Re in Treatment: 3 Wound Status Wound Number: 1 Primary Venous Leg  Ulcer Etiology: Wound Location: Left, Posterior Lower Leg Wound Open Wounding Event: Not Known Status: Date Acquired: 06/03/2015 Comorbid Cataracts, Glaucoma, Anemia, Weeks Of Treatment: 3 History: Congestive Heart Failure, Clustered Wound: No Osteoarthritis Wound Measurements Length: (cm) 0.2 Width: (cm) 0.2 Depth: (cm) 0.1 Area: (cm) 0.031 Volume: (cm) 0.003 % Reduction in Area: 99.9% % Reduction in Volume: 99.9% Epithelialization: None Wound Description Full Thickness Without Exposed Classification: Support Structures Wound Margin: Flat and Intact Exudate Large Amount: Exudate Type: Serous Exudate Color: amber Foul Odor After Cleansing: No Wound Bed Granulation Amount: Large (67-100%) Exposed Structure Granulation Quality: Red Fascia Exposed: No Necrotic Amount: Small (1-33%) Fat Layer Exposed: No Necrotic Quality: Adherent Slough Tendon Exposed: No Muscle Exposed: No Joint Exposed: No Bone Exposed: No Limited to Skin Breakdown Periwound Skin Texture Texture Color No Abnormalities Noted: No No Abnormalities Noted: No Callus: No Atrophie Blanche: No Heather Zuniga, Heather Zuniga ([id number]) Crepitus: No Cyanosis: No Excoriation: No Ecchymosis: No Fluctuance: No Erythema: No Friable: No Hemosiderin Staining: No Induration: No Mottled: No Localized Edema: No Pallor: No Rash: No Rubor: No Scarring: No Temperature / Pain Moisture Tenderness on Palpation: Yes No Abnormalities Noted: No Dry / Scaly: Yes Maceration: No Moist: No Wound Preparation Ulcer Cleansing: Rinsed/Irrigated with Saline Topical Anesthetic Applied: Other: lidocaine 4%, Electronic Signature(s) Signed: 07/17/2015 3:45:43 PM By: Elpidio Eric BSN, RN Previous Signature: 07/15/2015 5:35:27 PM Version By: Elpidio Eric BSN, RN Entered By: Elpidio Eric on 07/17/2015 11:42:04 Heather Zuniga ([id number]) -------------------------------------------------------------------------------- Vitals  Details Patient Name: Heather Zuniga, Heather Zuniga Date of Service: 07/14/2015 9:30 AM Medical Record Number: [id number] Patient Account Number: [id number] Date of Birth/Sex: 1922/10/07 (81 y.o. Female) Treating RN: Leonard Downing Primary Care Physician: Bjorn Pippin Other Clinician: Referring Physician: Bjorn Pippin Treating Physician/Extender: Rudene Re in Treatment: 3 Vital Signs Time Taken: 09:43 Temperature (F): 97.4 Height (in): 60 Pulse (bpm): 98 Weight (lbs): 213 Respiratory Rate (breaths/min): 17 Body Mass Index (BMI): 41.6 Blood Pressure (mmHg): 138/61 Reference Range: 80 - 120 mg / dl Pulse Oximetry (%): 98 Electronic Signature(s) Signed: 07/14/2015 4:20:52 PM By: Lucrezia Starch RN, Sendra Entered By: Lucrezia Starch RN, Sendra on 07/14/2015 09:43:12

## 2015-08-05 ENCOUNTER — Other Ambulatory Visit: Payer: Self-pay | Admitting: Family Medicine

## 2015-08-05 DIAGNOSIS — I83029 Varicose veins of left lower extremity with ulcer of unspecified site: Secondary | ICD-10-CM

## 2015-10-01 ENCOUNTER — Other Ambulatory Visit: Payer: Self-pay | Admitting: Family Medicine

## 2015-10-13 ENCOUNTER — Encounter: Payer: Medicare Other | Attending: Surgery | Admitting: Surgery

## 2015-10-13 DIAGNOSIS — H5441 Blindness, right eye, normal vision left eye: Secondary | ICD-10-CM | POA: Insufficient documentation

## 2015-10-13 DIAGNOSIS — M1990 Unspecified osteoarthritis, unspecified site: Secondary | ICD-10-CM | POA: Diagnosis not present

## 2015-10-13 DIAGNOSIS — K219 Gastro-esophageal reflux disease without esophagitis: Secondary | ICD-10-CM | POA: Diagnosis not present

## 2015-10-13 DIAGNOSIS — N183 Chronic kidney disease, stage 3 (moderate): Secondary | ICD-10-CM | POA: Diagnosis not present

## 2015-10-13 DIAGNOSIS — D649 Anemia, unspecified: Secondary | ICD-10-CM | POA: Diagnosis not present

## 2015-10-13 DIAGNOSIS — F329 Major depressive disorder, single episode, unspecified: Secondary | ICD-10-CM | POA: Diagnosis not present

## 2015-10-13 DIAGNOSIS — L97221 Non-pressure chronic ulcer of left calf limited to breakdown of skin: Secondary | ICD-10-CM | POA: Diagnosis not present

## 2015-10-13 DIAGNOSIS — Z87440 Personal history of urinary (tract) infections: Secondary | ICD-10-CM | POA: Diagnosis not present

## 2015-10-13 DIAGNOSIS — I890 Lymphedema, not elsewhere classified: Secondary | ICD-10-CM | POA: Diagnosis not present

## 2015-10-13 DIAGNOSIS — I509 Heart failure, unspecified: Secondary | ICD-10-CM | POA: Insufficient documentation

## 2015-10-13 DIAGNOSIS — Z6839 Body mass index (BMI) 39.0-39.9, adult: Secondary | ICD-10-CM | POA: Insufficient documentation

## 2015-10-13 DIAGNOSIS — L97211 Non-pressure chronic ulcer of right calf limited to breakdown of skin: Secondary | ICD-10-CM | POA: Insufficient documentation

## 2015-10-15 NOTE — Progress Notes (Signed)
EMI, LYMON ([id number]) Visit Report for 10/13/2015 Abuse/Suicide Risk Screen Details Patient Name: Heather Zuniga, Heather Zuniga Date of Service: 10/13/2015 2:15 PM Medical Record Number: [id number] Patient Account Number: [id number] Date of Birth/Sex: 1923-05-06 (82 y.o. Female) Treating RN: Phillis Haggis Primary Care Physician: Bjorn Pippin Other Clinician: Referring Physician: Bjorn Pippin Treating Physician/Extender: Rudene Re in Treatment: 0 Abuse/Suicide Risk Screen Items Answer ABUSE/SUICIDE RISK SCREEN: Has anyone close to you tried to hurt or harm you recentlyo No Do you feel uncomfortable with anyone in your familyo No Has anyone forced you do things that you didnot want to doo No Do you have any thoughts of harming yourselfo No Patient displays signs or symptoms of abuse and/or neglect. No Electronic Signature(s) Signed: 10/14/2015 5:37:49 PM By: Alejandro Mulling Entered By: Alejandro Mulling on 10/13/2015 15:00:41 Heather Zuniga ([id number]) -------------------------------------------------------------------------------- Activities of Daily Living Details Patient Name: Heather Zuniga, Heather Zuniga Date of Service: 10/13/2015 2:15 PM Medical Record Number: [id number] Patient Account Number: [id number] Date of Birth/Sex: 07-10-1923 (82 y.o. Female) Treating RN: Phillis Haggis Primary Care Physician: Bjorn Pippin Other Clinician: Referring Physician: Bjorn Pippin Treating Physician/Extender: Rudene Re in Treatment: 0 Activities of Daily Living Items Answer Activities of Daily Living (Please select one for each item) Drive Automobile Not Able Take Medications Need Assistance Use Telephone Need Assistance Care for Appearance Need Assistance Use Toilet Need Assistance Bath / Shower Not Able Dress Self Need Assistance Feed Self Completely Able Walk Need Assistance Get In / Out Bed Need Assistance Housework Not Able Prepare Meals Not Able Handle Money Not Able Shop for Self Not  Able Electronic Signature(s) Signed: 10/14/2015 5:37:49 PM By: Alejandro Mulling Entered By: Alejandro Mulling on 10/13/2015 15:01:31 Heather Zuniga ([id number]) -------------------------------------------------------------------------------- Education Assessment Details Patient Name: Heather Zuniga Date of Service: 10/13/2015 2:15 PM Medical Record Number: [id number] Patient Account Number: [id number] Date of Birth/Sex: 1923/01/07 (82 y.o. Female) Treating RN: Phillis Haggis Primary Care Physician: Bjorn Pippin Other Clinician: Referring Physician: Bjorn Pippin Treating Physician/Extender: Rudene Re in Treatment: 0 Primary Learner Assessed: Patient Learning Preferences/Education Level/Primary Language Learning Preference: Explanation, Printed Material Preferred Language: English Cognitive Barrier Assessment/Beliefs Language Barrier: No Translator Needed: No Memory Deficit: No Emotional Barrier: No Cultural/Religious Beliefs Affecting Medical No Care: Physical Barrier Assessment Impaired Vision: No Impaired Hearing: No Decreased Hand dexterity: No Knowledge/Comprehension Assessment Knowledge Level: High Comprehension Level: High Ability to understand written High instructions: Ability to understand verbal High instructions: Motivation Assessment Anxiety Level: Calm Cooperation: Cooperative Education Importance: Acknowledges Need Interest in Health Problems: Asks Questions Perception: Coherent Willingness to Engage in Self- High Management Activities: Readiness to Engage in Self- High Management Activities: Electronic Signature(s) Signed: 10/14/2015 5:37:49 PM By: Sharen Hones, Myriam Jacobson ([id number]) Entered By: Alejandro Mulling on 10/13/2015 15:01:50 Heather Zuniga, Heather Zuniga ([id number]) -------------------------------------------------------------------------------- Fall Risk Assessment Details Patient Name: Heather Zuniga Date of Service: 10/13/2015 2:15 PM Medical  Record Number: [id number] Patient Account Number: [id number] Date of Birth/Sex: September 04, 1922 (82 y.o. Female) Treating RN: Ashok Cordia, Debi Primary Care Physician: Bjorn Pippin Other Clinician: Referring Physician: Bjorn Pippin Treating Physician/Extender: Rudene Re in Treatment: 0 Fall Risk Assessment Items Have you had 2 or more falls in the last 12 monthso 0 No Have you had any fall that resulted in injury in the last 12 monthso 0 No FALL RISK ASSESSMENT: History of falling - immediate or within 3 months 0 No Secondary diagnosis 15 Yes Ambulatory aid None/bed rest/wheelchair/nurse 0 Yes Crutches/cane/walker 15 Yes Furniture 0 No IV Access/Saline Lock 0 No Gait/Training Normal/bed rest/immobile 0 No Weak 10  Yes Impaired 20 Yes Mental Status Oriented to own ability 0 Yes Electronic Signature(s) Signed: 10/14/2015 5:37:49 PM By: Alejandro Mulling Entered By: Alejandro Mulling on 10/13/2015 15:02:15 Heather Zuniga ([id number]) -------------------------------------------------------------------------------- Foot Assessment Details Patient Name: Heather Zuniga Date of Service: 10/13/2015 2:15 PM Medical Record Number: [id number] Patient Account Number: [id number] Date of Birth/Sex: Mar 06, 1923 (82 y.o. Female) Treating RN: Phillis Haggis Primary Care Physician: Bjorn Pippin Other Clinician: Referring Physician: Bjorn Pippin Treating Physician/Extender: Rudene Re in Treatment: 0 Foot Assessment Items Site Locations + = Sensation present, - = Sensation absent, C = Callus, U = Ulcer R = Redness, W = Warmth, M = Maceration, PU = Pre-ulcerative lesion F = Fissure, S = Swelling, D = Dryness Assessment Right: Left: Other Deformity: No No Prior Foot Ulcer: No No Prior Amputation: No No Charcot Joint: No No Ambulatory Status: Ambulatory With Help Assistance Device: Walker Gait: Steady Electronic Signature(s) Signed: 10/14/2015 5:37:49 PM By: Alejandro Mulling Entered By:  Alejandro Mulling on 10/13/2015 15:03:27 Heather Zuniga ([id number]) -------------------------------------------------------------------------------- Nutrition Risk Assessment Details Patient Name: Heather Zuniga Date of Service: 10/13/2015 2:15 PM Medical Record Number: [id number] Patient Account Number: [id number] Date of Birth/Sex: Jan 02, 1923 (82 y.o. Female) Treating RN: Ashok Cordia, Debi Primary Care Physician: Bjorn Pippin Other Clinician: Referring Physician: Bjorn Pippin Treating Physician/Extender: Rudene Re in Treatment: 0 Height (in): 61 Weight (lbs): 210 Body Mass Index (BMI): 39.7 Nutrition Risk Assessment Items NUTRITION RISK SCREEN: I have an illness or condition that made me change the kind and/or 0 No amount of food I eat I eat fewer than two meals per day 0 No I eat few fruits and vegetables, or milk products 0 No I have three or more drinks of beer, liquor or wine almost every day 0 No I have tooth or mouth problems that make it hard for me to eat 0 No I don't always have enough money to buy the food I need 0 No I eat alone most of the time 0 No I take three or more different prescribed or over-the-counter drugs a 1 Yes day Without wanting to, I have lost or gained 10 pounds in the last six 0 No months I am not always physically able to shop, cook and/or feed myself 0 No Nutrition Protocols Good Risk Protocol Moderate Risk Protocol Electronic Signature(s) Signed: 10/14/2015 5:37:49 PM By: Alejandro Mulling Entered By: Alejandro Mulling on 10/13/2015 15:02:55

## 2015-10-15 NOTE — Progress Notes (Signed)
SHY, FICKLE ([id number]) Visit Report for 10/13/2015 Allergy List Details Patient Name: Heather Zuniga Date of Service: 10/13/2015 2:15 PM Medical Record Number: [id number] Patient Account Number: [id number] Date of Birth/Sex: 12/05/1922 (82 y.o. Female) Treating RN: Phillis Haggis Primary Care Physician: Bjorn Pippin Other Clinician: Referring Physician: Bjorn Pippin Treating Physician/Extender: Rudene Re in Treatment: 0 Allergies Active Allergies No Known Allergies Allergy Notes Electronic Signature(s) Signed: 10/14/2015 5:37:49 PM By: Alejandro Mulling Entered By: Alejandro Mulling on 10/13/2015 15:03:36 Heather Zuniga ([id number]) -------------------------------------------------------------------------------- Arrival Information Details Patient Name: Heather Zuniga Date of Service: 10/13/2015 2:15 PM Medical Record Number: [id number] Patient Account Number: [id number] Date of Birth/Sex: 26-Aug-1922 (82 y.o. Female) Treating RN: Phillis Haggis Primary Care Physician: Bjorn Pippin Other Clinician: Referring Physician: Bjorn Pippin Treating Physician/Extender: Rudene Re in Treatment: 0 Visit Information Patient Arrived: Wheel Chair Arrival Time: 14:25 Accompanied By: caregiver Transfer Assistance: EasyPivot Patient Lift Patient Identification Verified: Yes Secondary Verification Process Yes Completed: Patient Requires Transmission- No Based Precautions: Patient Has Alerts: No History Since Last Visit All ordered tests and consults were completed: No Added or deleted any medications: No Any new allergies or adverse reactions: No Had a fall or experienced change in activities of daily living that may affect risk of falls: No Signs or symptoms of abuse/neglect since last visito No Hospitalized since last visit: No Electronic Signature(s) Signed: 10/14/2015 5:37:49 PM By: Alejandro Mulling Entered By: Alejandro Mulling on 10/13/2015 14:27:56 Heather Zuniga  ([id number]) -------------------------------------------------------------------------------- Clinic Level of Care Assessment Details Patient Name: Heather Zuniga Date of Service: 10/13/2015 2:15 PM Medical Record Number: [id number] Patient Account Number: [id number] Date of Birth/Sex: 1923/03/22 (82 y.o. Female) Treating RN: Phillis Haggis Primary Care Physician: Bjorn Pippin Other Clinician: Referring Physician: Bjorn Pippin Treating Physician/Extender: Rudene Re in Treatment: 0 Clinic Level of Care Assessment Items TOOL 2 Quantity Score X - Use when only an EandM is performed on the INITIAL visit 1 0 ASSESSMENTS - Nursing Assessment / Reassessment X - General Physical Exam (combine w/ comprehensive assessment (listed just 1 20 below) when performed on new pt. evals) X - Comprehensive Assessment (HX, ROS, Risk Assessments, Wounds Hx, etc.) 1 25 ASSESSMENTS - Wound and Skin Assessment / Reassessment []  - Simple Wound Assessment / Reassessment - one wound 0 X - Complex Wound Assessment / Reassessment - multiple wounds 3 5 []  - Dermatologic / Skin Assessment (not related to wound area) 0 ASSESSMENTS - Ostomy and/or Continence Assessment and Care []  - Incontinence Assessment and Management 0 []  - Ostomy Care Assessment and Management (repouching, etc.) 0 PROCESS - Coordination of Care []  - Simple Patient / Family Education for ongoing care 0 X - Complex (extensive) Patient / Family Education for ongoing care 1 20 []  - Staff obtains Chiropractor, Records, Test Results / Process Orders 0 X - Staff telephones HHA, Nursing Homes / Clarify orders / etc 1 10 []  - Routine Transfer to another Facility (non-emergent condition) 0 []  - Routine Hospital Admission (non-emergent condition) 0 X - New Admissions / Manufacturing engineer / Ordering NPWT, Apligraf, etc. 1 15 []  - Emergency Hospital Admission (emergent condition) 0 X - Simple Discharge Coordination 1 10 Pica, [place] ([id number]) []   - Complex (extensive) Discharge Coordination 0 PROCESS - Special Needs []  - Pediatric / Minor Patient Management 0 []  - Isolation Patient Management 0 []  - Hearing / Language / Visual special needs 0 []  - Assessment of Community assistance (transportation, D/C planning, etc.) 0 []  - Additional assistance / Altered mentation 0 []  - Support  Surface(s) Assessment (bed, cushion, seat, etc.) 0 INTERVENTIONS - Wound Cleansing / Measurement []  - Wound Imaging (photographs - any number of wounds) 0 []  - Wound Tracing (instead of photographs) 0 []  - Simple Wound Measurement - one wound 0 X - Complex Wound Measurement - multiple wounds 3 5 []  - Simple Wound Cleansing - one wound 0 X - Complex Wound Cleansing - multiple wounds 3 5 INTERVENTIONS - Wound Dressings []  - Small Wound Dressing one or multiple wounds 0 []  - Medium Wound Dressing one or multiple wounds 0 X - Large Wound Dressing one or multiple wounds 3 20 X - Application of Medications - injection 1 10 INTERVENTIONS - Miscellaneous []  - External ear exam 0 []  - Specimen Collection (cultures, biopsies, blood, body fluids, etc.) 0 []  - Specimen(s) / Culture(s) sent or taken to Lab for analysis 0 []  - Patient Transfer (multiple staff / / Similar devices) 0 []  - Simple Staple / Suture removal (25 or less) 0 []  - Complex Staple / Suture removal (26 or more) 0 [place], [place] ( ) []  - Hypo / Hyperglycemic Management (close monitor of Blood Glucose) 0 []  - Ankle / Brachial Index (ABI) - do not check if billed separately 0 Has the patient been seen at the hospital within the last three years: Yes Total Score: 215 Level Of Care: New/Established - Level 5 Electronic Signature(s) Signed: 10/14/2015 5:37:49 PM By: Entered By: on 10/14/2015 17:36:58 ( ) -------------------------------------------------------------------------------- Encounter Discharge Information  Details Patient Name: Date of Service: 10/13/2015 2:15 PM Medical Record Number: Nurse, adult Patient Account Number: Date of Birth/Sex: 01-Nov-1922 (82 y.o. Female) Treating RN: Primary Care Physician: Other Clinician: Referring Physician: 12/14/2015 Treating Physician/Extender: Alejandro Mulling in Treatment: 0 Encounter Discharge Information Items Discharge Pain Level: 0 Discharge Condition: Stable Ambulatory Status: Wheelchair Discharge Destination: Nursing Home Transportation: Other Accompanied By: caregiver Schedule Follow-up Appointment: Yes Medication Reconciliation completed No and provided to Patient/Care Frans Valente: Provided on Clinical Summary of Care: 10/13/2015 Form Type Recipient Paper Patient HB Electronic Signature(s) Signed: 10/13/2015 3:46:50 PM By: Heather Zuniga Entered By: [id number] on 10/13/2015 15:46:50 12/13/2015 ([id number]) -------------------------------------------------------------------------------- Lower Extremity Assessment Details Patient Name: [id number] Date of Service: 10/13/2015 2:15 PM Medical Record Number: [id number] Patient Account Number: Phillis Haggis Date of Birth/Sex: Dec 07, 1922 (82 y.o. Female) Treating RN: Rudene Re, Debi Primary Care Physician: 12/13/2015 Other Clinician: Referring Physician: 12/13/2015 Treating Physician/Extender: Gwenlyn Perking in Treatment: 0 Edema Assessment Assessed: [Left: No] [Right: No] Edema: [Left: Yes] [Right: Yes] Calf Left: Right: Point of Measurement: 31 cm From Medial Instep 46.7 cm 47 cm Ankle Left: Right: Point of Measurement: 10 cm From Medial Instep 30 cm 31 cm Vascular Assessment Pulses: Posterior Tibial Dorsalis Pedis Palpable: [Left:Yes] [Right:Yes] Extremity colors, hair growth, and conditions: Extremity Color: [Left:Hyperpigmented] [Right:Hyperpigmented] Temperature of Extremity: [Left:Warm] Capillary Refill: [Left:< 3  seconds] [Right:< 3 seconds] Blood Pressure: Brachial: [Left:142] [Right:142] Dorsalis Pedis: 152 [Left:Dorsalis Pedis: 136] Ankle: Posterior Tibial: 148 [Left:Posterior Tibial: 125 1.07] [Right:0.96] Toe Nail Assessment Left: Right: Thick: Yes Yes Discolored: Yes Yes Deformed: Yes Yes Improper Length and Hygiene: Yes Yes Electronic Signature(s) Signed: 10/14/2015 5:37:49 PM By: 12/13/2015, Heather Zuniga ([id number]) Entered By: Heather Zuniga on 10/13/2015 14:42:42 [id number] ([id number]) -------------------------------------------------------------------------------- Multi Wound Chart Details Patient Name: 03/05/1923 Date of Service: 10/13/2015 2:15 PM Medical Record Number: Ashok Cordia Patient Account Number: Bjorn Pippin Date of Birth/Sex: 1923-07-14 (82 y.o. Female) Treating RN: 12/14/2015,  Debi Primary Care Physician: Bjorn Pippin Other Clinician: Referring Physician: Bjorn Pippin Treating Physician/Extender: Rudene Re in Treatment: 0 Vital Signs Height(in): 61 Pulse(bpm): 72 Weight(lbs): 210 Blood Pressure 142/94 (mmHg): Body Mass Index(BMI): 40 Temperature(F): 97.7 Respiratory Rate 20 (breaths/min): Photos: [2:No Photos] [3:No Photos] [4:No Photos] Wound Location: [2:Left Lower Leg - Posterior Lower Leg - Lateral] [4:Right Lower Leg - Circumfernential] Wounding Event: [2:Gradually Appeared] [3:Blister] [4:Gradually Appeared] Primary Etiology: [2:To be determined] [3:To be determined] [4:To be determined] Date Acquired: [2:09/29/2015] [3:09/29/2015] [4:09/29/2015] Weeks of Treatment: [2:0] [3:0] [4:0] Wound Status: [2:Open] [3:Open] [4:Open] Measurements L x W x D 12x10x0.1 [3:6x3.7x0.1] [4:16x45.8x0.1] (cm) Area (cm) : [2:94.248] [3:17.436] [4:575.54] Volume (cm) : [2:9.425] [3:1.744] [4:57.554] % Reduction in Area: [2:N/A] [3:0.00%] [4:N/A] % Reduction in Volume: N/A [3:0.00%] [4:N/A] Classification: [2:Partial Thickness] [3:Partial Thickness]  [4:Partial Thickness] Exudate Amount: [2:Large] [3:Large] [4:N/A] Exudate Type: [2:Serosanguineous] [3:Serous] [4:N/A] Exudate Color: [2:red, brown] [3:amber] [4:N/A] Wound Margin: [2:Flat and Intact] [3:Flat and Intact] [4:Flat and Intact] Granulation Amount: [2:Large (67-100%)] [3:Medium (34-66%)] [4:Medium (34-66%)] Granulation Quality: [2:Red, Pink] [3:Red, Pink] [4:Red, Pink] Necrotic Amount: [2:Small (1-33%)] [3:Medium (34-66%)] [4:Medium (34-66%)] Exposed Structures: [2:Fascia: No Fat: No Tendon: No Muscle: No Joint: No Bone: No Limited to Skin Breakdown] [3:Fascia: No Fat: No Tendon: No Muscle: No Joint: No Bone: No Limited to Skin Breakdown] [4:Fascia: No Fat: No Tendon: No Muscle: No Joint: No Bone: No Limited to  Skin Breakdown] Epithelialization: None None None Periwound Skin Texture: Edema: Yes Edema: Yes Edema: Yes Periwound Skin Moist: Yes Maceration: Yes Maceration: Yes Moisture: Moist: Yes Moist: Yes Periwound Skin Color: No Abnormalities Noted No Abnormalities Noted No Abnormalities Noted Temperature: No Abnormality No Abnormality N/A Tenderness on Yes Yes No Palpation: Wound Preparation: Ulcer Cleansing: Ulcer Cleansing: Ulcer Cleansing: Rinsed/Irrigated with Rinsed/Irrigated with Rinsed/Irrigated with Saline Saline Saline Topical Anesthetic Topical Anesthetic Topical Anesthetic Applied: Other: lidocaine Applied: Other: lidocaine Applied: Other: lidocaine 4% 4% 4% Treatment Notes Electronic Signature(s) Signed: 10/14/2015 5:37:49 PM By: Alejandro Mulling Entered By: Alejandro Mulling on 10/13/2015 15:14:53 Heather Zuniga ([id number]) -------------------------------------------------------------------------------- Multi-Disciplinary Care Plan Details Patient Name: Heather Zuniga Date of Service: 10/13/2015 2:15 PM Medical Record Number: [id number] Patient Account Number: [id number] Date of Birth/Sex: [birth year] (82 y.o. Female) Treating RN: Phillis Haggis Primary  Care Physician: Bjorn Pippin Other Clinician: Referring Physician: Bjorn Pippin Treating Physician/Extender: Rudene Re in Treatment: 0 Active Inactive Abuse / Safety / Falls / Self Care Management Nursing Diagnoses: Potential for falls Goals: Patient will remain injury free Date Initiated: 10/13/2015 Goal Status: Active Interventions: Assess fall risk on admission and as needed Notes: Orientation to the Wound Care Program Nursing Diagnoses: Knowledge deficit related to the wound healing center program Goals: Patient/caregiver will verbalize understanding of the Wound Healing Center Program Date Initiated: 10/13/2015 Goal Status: Active Interventions: Provide education on orientation to the wound center Notes: Pain, Acute or Chronic Nursing Diagnoses: Pain, acute or chronic: actual or potential Potential alteration in comfort, pain GoalsCATY, LIGE ([id number]) Patient will verbalize adequate pain control and receive pain control interventions during procedures as needed Date Initiated: 10/13/2015 Goal Status: Active Interventions: Assess comfort goal upon admission Complete pain assessment as per visit requirements Notes: Wound/Skin Impairment Nursing Diagnoses: Impaired tissue integrity Goals: Ulcer/skin breakdown will have a volume reduction of 30% by week 4 Date Initiated: 10/13/2015 Goal Status: Active Ulcer/skin breakdown will have a volume reduction of 50% by week 8 Date Initiated: 10/13/2015 Goal Status: Active Ulcer/skin breakdown will have a volume reduction of 80% by week 12  Date Initiated: 10/13/2015 Goal Status: Active Interventions: Assess patient/caregiver ability to obtain necessary supplies Assess ulceration(s) every visit Notes: Electronic Signature(s) Signed: 10/14/2015 5:37:49 PM By: Alejandro Mulling Entered By: Alejandro Mulling on 10/13/2015 16:39:56 Heather Zuniga  ([id number]) -------------------------------------------------------------------------------- Pain Assessment Details Patient Name: Heather Zuniga Date of Service: 10/13/2015 2:15 PM Medical Record Number: [id number] Patient Account Number: [id number] Date of Birth/Sex: 1922-12-19 (82 y.o. Female) Treating RN: Phillis Haggis Primary Care Physician: Bjorn Pippin Other Clinician: Referring Physician: Bjorn Pippin Treating Physician/Extender: Rudene Re in Treatment: 0 Active Problems Location of Pain Severity and Description of Pain Patient Has Paino No Site Locations Pain Management and Medication Current Pain Management: Electronic Signature(s) Signed: 10/14/2015 5:37:49 PM By: Alejandro Mulling Entered By: Alejandro Mulling on 10/13/2015 14:28:12 Heather Zuniga ([id number]) -------------------------------------------------------------------------------- Patient/Caregiver Education Details Patient Name: Heather Zuniga Date of Service: 10/13/2015 2:15 PM Medical Record Number: [id number] Patient Account Number: [id number] Date of Birth/Gender: May 01, 1923 (82 y.o. Female) Treating RN: Phillis Haggis Primary Care Physician: Bjorn Pippin Other Clinician: Referring Physician: Bjorn Pippin Treating Physician/Extender: Rudene Re in Treatment: 0 Education Assessment Education Provided To: Patient and Caregiver Education Topics Provided Wound/Skin Impairment: Handouts: Other: do not get wraps wet Methods: Demonstration, Explain/Verbal Responses: State content correctly Electronic Signature(s) Signed: 10/14/2015 5:37:49 PM By: Alejandro Mulling Entered By: Alejandro Mulling on 10/13/2015 15:19:11 Heather Zuniga ([id number]) -------------------------------------------------------------------------------- Wound Assessment Details Patient Name: Heather Zuniga Date of Service: 10/13/2015 2:15 PM Medical Record Number: [id number] Patient Account Number: [id number] Date of Birth/Sex: 10-16-1922  (82 y.o. Female) Treating RN: Phillis Haggis Primary Care Physician: Bjorn Pippin Other Clinician: Referring Physician: Bjorn Pippin Treating Physician/Extender: Rudene Re in Treatment: 0 Wound Status Wound Number: 2 Primary Etiology: To be determined Wound Location: Left Lower Leg - Posterior Wound Status: Open Wounding Event: Gradually Appeared Date Acquired: 09/29/2015 Weeks Of Treatment: 0 Clustered Wound: No Photos Photo Uploaded By: Alejandro Mulling on 10/13/2015 16:03:54 Wound Measurements Length: (cm) 12 Width: (cm) 10 Depth: (cm) 0.1 Area: (cm) 94.248 Volume: (cm) 9.425 % Reduction in Area: % Reduction in Volume: Epithelialization: None Tunneling: No Undermining: No Wound Description Classification: Partial Thickness Wound Margin: Flat and Intact Exudate Amount: Large Exudate Type: Serosanguineous Exudate Color: red, brown Foul Odor After Cleansing: No Wound Bed Granulation Amount: Large (67-100%) Exposed Structure Granulation Quality: Red, Pink Fascia Exposed: No Necrotic Amount: Small (1-33%) Fat Layer Exposed: No Necrotic Quality: Adherent Slough Tendon Exposed: No Wix, Herberta ([id number]) Muscle Exposed: No Joint Exposed: No Bone Exposed: No Limited to Skin Breakdown Periwound Skin Texture Texture Color No Abnormalities Noted: No No Abnormalities Noted: No Localized Edema: Yes Temperature / Pain Moisture Temperature: No Abnormality No Abnormalities Noted: No Tenderness on Palpation: Yes Moist: Yes Wound Preparation Ulcer Cleansing: Rinsed/Irrigated with Saline Topical Anesthetic Applied: Other: lidocaine 4%, Treatment Notes Wound #2 (Left, Posterior Lower Leg) 1. Cleansed with: Clean wound with Normal Saline Cleanse wound with antibacterial soap and water 2. Anesthetic Topical Lidocaine 4% cream to wound bed prior to debridement 3. Peri-wound Care: Barrier cream 4. Dressing Applied: Aquacel Ag 5. Secondary Dressing  Applied ABD Pad 7. Secured with Tape 3 Layer Compression System - Bilateral Electronic Signature(s) Signed: 10/14/2015 5:37:49 PM By: Alejandro Mulling Entered By: Alejandro Mulling on 10/13/2015 14:50:18 Heather Zuniga ([id number]) -------------------------------------------------------------------------------- Wound Assessment Details Patient Name: Heather Zuniga Date of Service: 10/13/2015 2:15 PM Medical Record Number: [id number] Patient Account Number: [id number] Date of Birth/Sex: [birth year] (82 y.o. Female) Treating RN: Phillis Haggis Primary Care Physician: Bjorn Pippin Other Clinician: Referring Physician: Bjorn Pippin  Treating Physician/Extender: Rudene Re in Treatment: 0 Wound Status Wound Number: 3 Primary Etiology: To be determined Wound Location: Lower Leg - Lateral Wound Status: Open Wounding Event: Blister Date Acquired: 09/29/2015 Weeks Of Treatment: 0 Clustered Wound: No Photos Photo Uploaded By: Alejandro Mulling on 10/13/2015 16:03:54 Wound Measurements Length: (cm) 6 Width: (cm) 3.7 Depth: (cm) 0.1 Area: (cm) 17.436 Volume: (cm) 1.744 % Reduction in Area: 0% % Reduction in Volume: 0% Epithelialization: None Tunneling: No Undermining: No Wound Description Classification: Partial Thickness Wound Margin: Flat and Intact Exudate Amount: Large Exudate Type: Serous Exudate Color: amber Foul Odor After Cleansing: No Wound Bed Granulation Amount: Medium (34-66%) Exposed Structure Granulation Quality: Red, Pink Fascia Exposed: No Necrotic Amount: Medium (34-66%) Fat Layer Exposed: No Necrotic Quality: Adherent Slough Tendon Exposed: No Heckert, Alizee ([id number]) Muscle Exposed: No Joint Exposed: No Bone Exposed: No Limited to Skin Breakdown Periwound Skin Texture Texture Color No Abnormalities Noted: No No Abnormalities Noted: No Localized Edema: Yes Temperature / Pain Moisture Temperature: No Abnormality No Abnormalities Noted:  No Tenderness on Palpation: Yes Maceration: Yes Moist: Yes Wound Preparation Ulcer Cleansing: Rinsed/Irrigated with Saline Topical Anesthetic Applied: Other: lidocaine 4%, Treatment Notes Wound #3 (Lateral Lower Leg) 1. Cleansed with: Clean wound with Normal Saline Cleanse wound with antibacterial soap and water 2. Anesthetic Topical Lidocaine 4% cream to wound bed prior to debridement 3. Peri-wound Care: Barrier cream 4. Dressing Applied: Aquacel Ag 5. Secondary Dressing Applied ABD Pad 7. Secured with Tape 3 Layer Compression System - Bilateral Electronic Signature(s) Signed: 10/14/2015 5:37:49 PM By: Alejandro Mulling Entered By: Alejandro Mulling on 10/13/2015 14:53:54 Heather Zuniga ([id number]) -------------------------------------------------------------------------------- Wound Assessment Details Patient Name: Heather Zuniga Date of Service: 10/13/2015 2:15 PM Medical Record Number: [id number] Patient Account Number: [id number] Date of Birth/Sex: 1922/09/25 (82 y.o. Female) Treating RN: Phillis Haggis Primary Care Physician: Bjorn Pippin Other Clinician: Referring Physician: Bjorn Pippin Treating Physician/Extender: Rudene Re in Treatment: 0 Wound Status Wound Number: 4 Primary Etiology: To be determined Wound Location: Right Lower Leg - Wound Status: Open Circumfernential Wounding Event: Gradually Appeared Date Acquired: 09/29/2015 Weeks Of Treatment: 0 Clustered Wound: No Photos Photo Uploaded By: Alejandro Mulling on 10/13/2015 16:04:25 Wound Measurements Length: (cm) 16 Width: (cm) 45.8 Depth: (cm) 0.1 Area: (cm) 575.54 Volume: (cm) 57.554 % Reduction in Area: % Reduction in Volume: Epithelialization: None Tunneling: No Undermining: No Wound Description Classification: Partial Thickness Wound Margin: Flat and Intact Wound Bed Granulation Amount: Medium (34-66%) Exposed Structure Granulation Quality: Red, Pink Fascia Exposed: No Necrotic  Amount: Medium (34-66%) Fat Layer Exposed: No Necrotic Quality: Adherent Slough Tendon Exposed: No Muscle Exposed: No Joint Exposed: No Alexa, Kiya ([id number]) Bone Exposed: No Limited to Skin Breakdown Periwound Skin Texture Texture Color No Abnormalities Noted: No No Abnormalities Noted: No Localized Edema: Yes Moisture No Abnormalities Noted: No Maceration: Yes Moist: Yes Wound Preparation Ulcer Cleansing: Rinsed/Irrigated with Saline Topical Anesthetic Applied: Other: lidocaine 4%, Treatment Notes Wound #4 (Right, Circumferential Lower Leg) 1. Cleansed with: Clean wound with Normal Saline Cleanse wound with antibacterial soap and water 2. Anesthetic Topical Lidocaine 4% cream to wound bed prior to debridement 3. Peri-wound Care: Barrier cream 4. Dressing Applied: Aquacel Ag 5. Secondary Dressing Applied ABD Pad 7. Secured with Tape 3 Layer Compression System - Bilateral Electronic Signature(s) Signed: 10/14/2015 5:37:49 PM By: Alejandro Mulling Entered By: Alejandro Mulling on 10/13/2015 14:57:23 Heather Zuniga ([id number]) -------------------------------------------------------------------------------- Vitals Details Patient Name: Heather Zuniga Date of Service: 10/13/2015 2:15 PM Medical Record Number: [id number] Patient Account  Number: [id number] Date of Birth/Sex: 1922/12/24 (82 y.o. Female) Treating RN: Ashok Cordia, Debi Primary Care Physician: Bjorn Pippin Other Clinician: Referring Physician: Bjorn Pippin Treating Physician/Extender: Rudene Re in Treatment: 0 Vital Signs Time Taken: 14:28 Temperature (F): 97.7 Height (in): 61 Pulse (bpm): 72 Source: Stated Respiratory Rate (breaths/min): 20 Weight (lbs): 210 Blood Pressure (mmHg): 142/94 Source: Stated Reference Range: 80 - 120 mg / dl Body Mass Index (BMI): 39.7 Electronic Signature(s) Signed: 10/14/2015 5:37:49 PM By: Alejandro Mulling Entered By: Alejandro Mulling on 10/13/2015 14:31:00

## 2015-10-15 NOTE — Progress Notes (Signed)
KENLEE, VOGT ([id number]) Visit Report for 10/13/2015 Chief Complaint Document Details Patient Name: Heather Zuniga, Heather Zuniga Date of Service: 10/13/2015 2:15 PM Medical Record Number: [id number] Patient Account Number: [id number] Date of Birth/Sex: [birth year] (83 y.o. Female) Treating RN: Phillis Haggis Primary Care Physician: Bjorn Pippin Other Clinician: Referring Physician: Bjorn Pippin Treating Physician/Extender: Rudene Re in Treatment: 0 Information Obtained from: Patient Chief Complaint This 82 year old patient has had bilateral lower extremity swelling for over 3 years and now has had an open ulceration on the posterior part of the left leg for several months and a recurrent ulceration on the right lower extremity which she's had for about a week Electronic Signature(s) Signed: 10/13/2015 3:43:16 PM By: Evlyn Kanner MD, FACS Entered By: Evlyn Kanner on 10/13/2015 15:43:15 Heather Zuniga ([id number]) -------------------------------------------------------------------------------- HPI Details Patient Name: Heather Zuniga Date of Service: 10/13/2015 2:15 PM Medical Record Number: [id number] Patient Account Number: [id number] Date of Birth/Sex: [birth year] (82 y.o. Female) Treating RN: Phillis Haggis Primary Care Physician: Bjorn Pippin Other Clinician: Referring Physician: Bjorn Pippin Treating Physician/Extender: Rudene Re in Treatment: 0 History of Present Illness Location: ulcerated area on the left and right posterior leg. She also has bilateral lower extremity swelling Quality: Patient reports experiencing a dull pain to affected area(s). Severity: Patient states wound are getting worse. Duration: Patient has had the wound for > 3 months prior to seeking treatment at the wound center Timing: Pain in wound is Intermittent (comes and goes Context: The wound appeared gradually over time Modifying Factors: Other treatment(s) tried include: she has had doxycycline and a  compression ace wrap applied Associated Signs and Symptoms: Patient reports having increase swelling. HPI Description: she has been seen by others in November of last year and was last seen on December 5 when she was lost to follow-up. I understand she did have a vascular workup done but we do not have these reports. She was prescribed lymphedema pumps which have not been in use as they do not have the personnel to put these on at the nursing home. 82 year old patient sent by her PCP Venora Maples for a stasis ulcer on her left lower leg which she's had for many months. she was also noticed to have recently, leg swelling and oozing. A past medical history significant for GERD, chronic kidney disease stage III,anemia, arthritis, pyuria, edema and depression. She has never been a smoker. Electronic Signature(s) Signed: 10/13/2015 3:45:56 PM By: Evlyn Kanner MD, FACS Entered By: Evlyn Kanner on 10/13/2015 15:45:56 SHANTASIA, HUNNELL ([id number]) -------------------------------------------------------------------------------- Physical Exam Details Patient Name: Heather Zuniga Date of Service: 10/13/2015 2:15 PM Medical Record Number: [id number] Patient Account Number: [id number] Date of Birth/Sex: 1922/12/24 (82 y.o. Female) Treating RN: Phillis Haggis Primary Care Physician: Bjorn Pippin Other Clinician: Referring Physician: Bjorn Pippin Treating Physician/Extender: Rudene Re in Treatment: 0 Constitutional . Pulse regular. Respirations normal and unlabored. Afebrile. . Eyes Nonicteric. Reactive to light. Ears, Nose, Mouth, and Throat Lips, teeth, and gums WNL.Marland Kitchen Moist mucosa without lesions. Neck supple and nontender. No palpable supraclavicular or cervical adenopathy. Normal sized without goiter. Respiratory WNL. No retractions.. Cardiovascular Pedal Pulses WNL. her left ABI was 1.07 on the right was 0.96. No clubbing, cyanosis or edema. Gastrointestinal (GI) Abdomen without masses  or tenderness.. No liver or spleen enlargement or tenderness.. Lymphatic No adneopathy. No adenopathy. No adenopathy. Musculoskeletal Adexa without tenderness or enlargement.. Digits and nails w/o clubbing, cyanosis, infection, petechiae, ischemia, or inflammatory conditions.. Integumentary (Hair, Skin) No suspicious lesions. No crepitus or fluctuance. No peri-wound warmth or  erythema. No masses.Marland Kitchen Psychiatric Judgement and insight Intact.. No evidence of depression, anxiety, or agitation.. Notes she has bilateral lower extremity lymphedema and has superficial ulcerations with breakdown of skin bilaterally worse on the left posterior leg. No debridement was required today Electronic Signature(s) Signed: 10/13/2015 3:46:54 PM By: Evlyn Kanner MD, FACS Entered By: Evlyn Kanner on 10/13/2015 15:46:54 Heather Zuniga ([id number]) -------------------------------------------------------------------------------- Physician Orders Details Patient Name: Heather Zuniga Date of Service: 10/13/2015 2:15 PM Medical Record Number: [id number] Patient Account Number: [id number] Date of Birth/Sex: 09-Dec-1922 (82 y.o. Female) Treating RN: Phillis Haggis Primary Care Physician: Bjorn Pippin Other Clinician: Referring Physician: Bjorn Pippin Treating Physician/Extender: Rudene Re in Treatment: 0 Verbal / Phone Orders: Yes Clinician: Pinkerton, Debi Read Back and Verified: Yes Diagnosis Coding Wound Cleansing Wound #2 Left,Posterior Lower Leg o Clean wound with Normal Saline. o Cleanse wound with mild soap and water Wound #3 Lateral Lower Leg o Clean wound with Normal Saline. o Cleanse wound with mild soap and water Wound #4 Right,Circumferential Lower Leg o Clean wound with Normal Saline. o Cleanse wound with mild soap and water Anesthetic Wound #2 Left,Posterior Lower Leg o Topical Lidocaine 4% cream applied to wound bed prior to debridement Wound #3 Lateral Lower Leg o  Topical Lidocaine 4% cream applied to wound bed prior to debridement Wound #4 Right,Circumferential Lower Leg o Topical Lidocaine 4% cream applied to wound bed prior to debridement Skin Barriers/Peri-Wound Care Wound #2 Left,Posterior Lower Leg o Barrier cream o Moisturizing lotion - to heels and feet (not between toes) Wound #3 Lateral Lower Leg o Barrier cream o Moisturizing lotion - to heels and feet (not between toes) Wound #4 Right,Circumferential Lower Leg o Barrier cream o Moisturizing lotion - to heels and feet (not between toes) Primary Wound Dressing FEIGEL, Smera ([id number]) Wound #2 Left,Posterior Lower Leg o Aquacel Ag Wound #3 Lateral Lower Leg o Aquacel Ag Wound #4 Right,Circumferential Lower Leg o Aquacel Ag Secondary Dressing Wound #2 Left,Posterior Lower Leg o ABD pad Wound #3 Lateral Lower Leg o ABD pad Wound #4 Right,Circumferential Lower Leg o ABD pad Dressing Change Frequency Wound #2 Left,Posterior Lower Leg o Change Dressing Monday, Wednesday, Friday Wound #3 Lateral Lower Leg o Change Dressing Monday, Wednesday, Friday Wound #4 Right,Circumferential Lower Leg o Change Dressing Monday, Wednesday, Friday Follow-up Appointments Wound #2 Left,Posterior Lower Leg o Return Appointment in 1 week. Wound #3 Lateral Lower Leg o Return Appointment in 1 week. Wound #4 Right,Circumferential Lower Leg o Return Appointment in 1 week. Edema Control Wound #2 Left,Posterior Lower Leg o 3 Layer Compression System - Bilateral - anchor with unna o Elevate legs to the level of the heart and pump ankles as often as possible - in recliner and or in bed Wound #3 Lateral Lower Leg o 3 Layer Compression System - Bilateral - anchor with unna o Elevate legs to the level of the heart and pump ankles as often as possible Bucaro, Lynleigh ([id number]) Wound #4 Right,Circumferential Lower Leg o 3 Layer Compression System -  Bilateral - anchor with unna o Elevate legs to the level of the heart and pump ankles as often as possible Home Health Wound #2 Left,Posterior Lower Leg o Continue Home Health Visits - [facility] *****Please go wrap legs and do dressing changes on Wednesdays and Fridays and pt will be seen in clinic on Mondays***** o Home Health Nurse may visit PRN to address patientos wound care needs. o FACE TO FACE ENCOUNTER: MEDICARE and MEDICAID PATIENTS: I certify that this patient  is under my care and that I had a face-to-face encounter that meets the physician face-to-face encounter requirements with this patient on this date. The encounter with the patient was in whole or in part for the following MEDICAL CONDITION: (primary reason for Home Healthcare) MEDICAL NECESSITY: I certify, that based on my findings, NURSING services are a medically necessary home health service. HOME BOUND STATUS: I certify that my clinical findings support that this patient is homebound (i.e., Due to illness or injury, pt requires aid of supportive devices such as crutches, cane, wheelchairs, walkers, the use of special transportation or the assistance of another person to leave their place of residence. There is a normal inability to leave the home and doing so requires considerable and taxing effort. Other absences are for medical reasons / religious services and are infrequent or of short duration when for other reasons). o If current dressing causes regression in wound condition, may D/C ordered dressing product/s and apply Normal Saline Moist Dressing daily until next Wound Healing Center / Other MD appointment. Notify Wound Healing Center of regression in wound condition at [phone]. o Please direct any NON-WOUND related issues/requests for orders to patient's Primary Care Physician Wound #3 Lateral Lower Leg o Continue Home Health Visits - [facility] *****Please go wrap legs and do dressing changes  on Wednesdays and Fridays and pt will be seen in clinic on Mondays***** o Home Health Nurse may visit PRN to address patientos wound care needs. o FACE TO FACE ENCOUNTER: MEDICARE and MEDICAID PATIENTS: I certify that this patient is under my care and that I had a face-to-face encounter that meets the physician face-to-face encounter requirements with this patient on this date. The encounter with the patient was in whole or in part for the following MEDICAL CONDITION: (primary reason for Home Healthcare) MEDICAL NECESSITY: I certify, that based on my findings, NURSING services are a medically necessary home health service. HOME BOUND STATUS: I certify that my clinical findings support that this patient is homebound (i.e., Due to illness or injury, pt requires aid of supportive devices such as crutches, cane, wheelchairs, walkers, the use of special transportation or the assistance of another person to leave their place of residence. There is a normal inability to leave the home and doing so requires considerable and taxing effort. Other absences are for medical reasons / religious services and are infrequent or of short duration when for other reasons). o If current dressing causes regression in wound condition, may D/C ordered dressing product/s and apply Normal Saline Moist Dressing daily until next Wound Healing Center / Other MD appointment. Notify Wound Healing Center of regression in wound condition at [phone]. BRAYDEN, BETTERS ([id number]) o Please direct any NON-WOUND related issues/requests for orders to patient's Primary Care Physician Wound #4 Right,Circumferential Lower Leg o Continue Home Health Visits - [facility] *****Please go wrap legs and do dressing changes on Wednesdays and Fridays and pt will be seen in clinic on Mondays***** o Home Health Nurse may visit PRN to address patientos wound care needs. o FACE TO FACE ENCOUNTER: MEDICARE and MEDICAID PATIENTS: I  certify that this patient is under my care and that I had a face-to-face encounter that meets the physician face-to-face encounter requirements with this patient on this date. The encounter with the patient was in whole or in part for the following MEDICAL CONDITION: (primary reason for Home Healthcare) MEDICAL NECESSITY: I certify, that based on my findings, NURSING services are a medically necessary home health service. HOME BOUND  STATUS: I certify that my clinical findings support that this patient is homebound (i.e., Due to illness or injury, pt requires aid of supportive devices such as crutches, cane, wheelchairs, walkers, the use of special transportation or the assistance of another person to leave their place of residence. There is a normal inability to leave the home and doing so requires considerable and taxing effort. Other absences are for medical reasons / religious services and are infrequent or of short duration when for other reasons). o If current dressing causes regression in wound condition, may D/C ordered dressing product/s and apply Normal Saline Moist Dressing daily until next Wound Healing Center / Other MD appointment. Notify Wound Healing Center of regression in wound condition at [phone]. o Please direct any NON-WOUND related issues/requests for orders to patient's Primary Care Physician Consults o Podiatry - Pt needs toes nails cut. Facility needs to call and make this appt. Electronic Signature(s) Signed: 10/13/2015 3:57:47 PM By: Evlyn Kanner MD, FACS Signed: 10/14/2015 5:37:49 PM By: Alejandro Mulling Entered By: Alejandro Mulling on 10/13/2015 15:48:47 ANNMARGARET, DECAPRIO ([id number]) -------------------------------------------------------------------------------- Problem List Details Patient Name: Heather Zuniga Date of Service: 10/13/2015 2:15 PM Medical Record Number: [id number] Patient Account Number: [id number] Date of Birth/Sex: February 19, 1923 (82 y.o.  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/2015 Yes breakdown of skin L97.211 Non-pressure chronic ulcer of right calf limited to 10/13/2015 Yes breakdown of skin E66.01 Morbid (severe) obesity due to excess calories 10/13/2015 Yes N18.3 Chronic kidney disease, stage 3 (moderate) 10/13/2015 Yes Inactive Problems Resolved Problems Electronic Signature(s) Signed: 10/13/2015 3:42:34 PM By: Evlyn Kanner MD, FACS Entered By: Evlyn Kanner on 10/13/2015 15:42:33 Heather Zuniga ([id number]) -------------------------------------------------------------------------------- Progress Note Details Patient Name: Heather Zuniga Date of Service: 10/13/2015 2:15 PM Medical Record Number: [id number] Patient Account Number: [id number] Date of Birth/Sex: Oct 19, 1922 (82 y.o. Female) Treating RN: Ashok Cordia, Debi Primary Care Physician: Bjorn Pippin Other Clinician: Referring Physician: Bjorn Pippin Treating Physician/Extender: Rudene Re in Treatment: 0 Subjective Chief Complaint Information obtained from Patient This 82 year old patient has had bilateral lower extremity swelling for over 3 years and now has had an open ulceration on the posterior part of the left leg for several months and a recurrent ulceration on the right lower extremity which she's had for about a week History of Present Illness (HPI) The following HPI elements were documented for the patient's wound: Location: ulcerated area on the left and right posterior leg. She also has bilateral lower extremity swelling Quality: Patient reports experiencing a dull pain to affected area(s). Severity: Patient  states wound are getting worse. Duration: Patient has had the wound for > 3 months prior to seeking treatment at the wound center Timing: Pain in wound is Intermittent (comes and goes Context: The wound appeared gradually over time Modifying Factors: Other treatment(s) tried include: she has had doxycycline and a compression ace wrap applied Associated Signs and Symptoms: Patient reports having increase swelling. she has been seen by others in November of last year and was last seen on December 5 when she was lost to follow-up. I understand she did have a vascular workup done but we do not have these reports. She was prescribed lymphedema pumps which have not been in use as they do not have the personnel to  put these on at the nursing home. 82 year old patient sent by her PCP Venora Maples for a stasis ulcer on her left lower leg which she's had for many months. she was also noticed to have recently, leg swelling and oozing. A past medical history significant for GERD, chronic kidney disease stage III,anemia, arthritis, pyuria, edema and depression. She has never been a smoker. Wound History Patient presents with 3 open wounds that have been present for approximately 2 weeks. Patient has been treating wounds in the following manner: unna boots. The wounds have been healed in the past but have re-opened. Laboratory tests have not been performed in the last month. Patient reportedly has not tested positive for an antibiotic resistant organism. Patient reportedly has not tested positive for osteomyelitis. Patient reportedly has had testing performed to evaluate circulation in the legs. Patient experiences the following problems associated with their wounds: swelling. Patient History Information obtained from Patient. ELVIE, PALOMO ([id number]) Allergies No Known Allergies Family History No family history of Cancer, Diabetes, Heart Disease, Hereditary Spherocytosis, Hypertension,  Kidney Disease, Lung Disease, Seizures, Stroke, Thyroid Problems, Tuberculosis. Social History Never smoker, Marital Status - Divorced, Alcohol Use - Never, Drug Use - No History, Caffeine Use - Never. Medical History Hospitalization/Surgery History - 05/09/2012, [facility], wound on back. 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 Psychiatric depression Medications Tylenol Arthritis Pain 650 mg tablet,extended release oral 2 2 tablet extended release oral two times daily Systane Ultra 0.4 %-0.3 % eye drops ophthalmic 1 1 drops ophthalmic two times daily Ventolin HFA 90 mcg/actuation aerosol inhaler inhalation HFA aerosol inhaler inhalation two to three puffs every four hours as needed ferrous sulfate 325 mg (65 mg iron) tablet oral 1 1 tablet oral torsemide 20 mg tablet oral 1 1 tablet oral daily timolol maleate 0.5 % eye drops ophthalmic drops ophthalmic one drop in left eye each morning Xalatan 0.005 % eye drops ophthalmic 1 1 drops ophthalmic into each eye nightly Therems tablet oral 1 1 tablet oral daily potassium chloride ER 10 mEq tablet,extended release oral 1 1 tablet extended release oral daily spironolactone 25 mg tablet oral 1 1 tablet oral daily Prilosec OTC 20 mg tablet,delayed release oral 1 1 tablet,delayed release (DR/EC) oral daily levothyroxine 25 mcg tablet oral 1 1 tablet oral daily Objective Constitutional Resnik, Areya ([id number]) Pulse regular. Respirations normal and unlabored. Afebrile. Vitals Time Taken: 2:28 PM, Height: 61 in, Source: Stated, Weight: 210 lbs, Source: Stated, BMI: 39.7, Temperature: 97.7 F, Pulse: 72 bpm, Respiratory Rate: 20 breaths/min, Blood Pressure: 142/94 mmHg. Eyes Nonicteric. Reactive to light. Ears, Nose, Mouth, and Throat Lips, teeth, and gums WNL.Marland Kitchen Moist mucosa without lesions. Neck supple and nontender. No palpable supraclavicular or cervical adenopathy. Normal sized without goiter. Respiratory WNL. No  retractions.. Cardiovascular Pedal Pulses WNL. her left ABI was 1.07 on the right was 0.96. No clubbing, cyanosis or edema. Gastrointestinal (GI) Abdomen without masses or tenderness.. No liver or spleen enlargement or tenderness.. Lymphatic No adneopathy. No adenopathy. No adenopathy. Musculoskeletal Adexa without tenderness or enlargement.. Digits and nails w/o clubbing, cyanosis, infection, petechiae, ischemia, or inflammatory conditions.Marland Kitchen Psychiatric Judgement and insight Intact.. No evidence of depression, anxiety, or agitation.. General Notes: she has bilateral lower extremity lymphedema and has superficial ulcerations with breakdown of skin bilaterally worse on the left posterior leg. No debridement was required today Integumentary (Hair, Skin) No suspicious lesions. No crepitus or fluctuance. No peri-wound warmth or erythema. No masses.. Wound #2 status is Open. Original cause of wound  was Gradually Appeared. The wound is located on the Left,Posterior Lower Leg. The wound measures 12cm length x 10cm width x 0.1cm depth; 94.248cm^2 area and 9.425cm^3 volume. The wound is limited to skin breakdown. There is no tunneling or undermining noted. There is a large amount of serosanguineous drainage noted. The wound margin is flat and intact. There is large (67-100%) red, pink granulation within the wound bed. There is a small (1-33%) amount of necrotic tissue within the wound bed including Adherent Slough. The periwound skin appearance exhibited: Localized Edema, Moist. Periwound temperature was noted as No Abnormality. The periwound has tenderness on palpation. Wound #3 status is Open. Original cause of wound was Blister. The wound is located on the [place] ([id number]) Leg. The wound measures 6cm length x 3.7cm width x 0.1cm depth; 17.436cm^2 area and 1.744cm^3 volume. The wound is limited to skin breakdown. There is no tunneling or undermining noted. There is a large  amount of serous drainage noted. The wound margin is flat and intact. There is medium (34-66%) red, pink granulation within the wound bed. There is a medium (34-66%) amount of necrotic tissue within the wound bed including Adherent Slough. The periwound skin appearance exhibited: Localized Edema, Maceration, Moist. Periwound temperature was noted as No Abnormality. The periwound has tenderness on palpation. Wound #4 status is Open. Original cause of wound was Gradually Appeared. The wound is located on the Right,Circumferential Lower Leg. The wound measures 16cm length x 45.8cm width x 0.1cm depth; 575.54cm^2 area and 57.554cm^3 volume. The wound is limited to skin breakdown. There is no tunneling or undermining noted. The wound margin is flat and intact. There is medium (34-66%) red, pink granulation within the wound bed. There is a medium (34-66%) amount of necrotic tissue within the wound bed including Adherent Slough. The periwound skin appearance exhibited: Localized Edema, Maceration, Moist. 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 We have requested her reports from the vascular office. In the meanwhile I have recommended elevation and exercise and we will use Aquacel Ag and a 3 layer Profore wrap Results to come back and see [place] every week and her caregiver says she will be compliant Plan Wound Cleansing: Wound #2 Left,Posterior Lower Leg: Clean wound with Normal Saline. HARSEERAT, LEMOI  ([id number]) Cleanse wound with mild soap and water Wound #3 Lateral Lower Leg: Clean wound with Normal Saline. Cleanse wound with mild soap and water Wound #4 Right,Circumferential Lower Leg: Clean wound with Normal Saline. Cleanse wound with mild soap and water Anesthetic: Wound #2 Left,Posterior Lower Leg: Topical Lidocaine 4% cream applied to wound bed prior to debridement Wound #3 Lateral Lower Leg: Topical Lidocaine 4% cream applied to wound bed prior to debridement Wound #4 Right,Circumferential Lower Leg: Topical Lidocaine 4% cream applied to wound bed prior to debridement Skin Barriers/Peri-Wound Care: Wound #2 Left,Posterior Lower Leg: Barrier cream Moisturizing lotion - to heels and feet (not between toes) Wound #3 Lateral Lower Leg: Barrier cream Moisturizing lotion - to heels and feet (not between toes) Wound #4 Right,Circumferential Lower Leg: Barrier  cream Moisturizing lotion - to heels and feet (not between toes) Primary Wound Dressing: Wound #2 Left,Posterior Lower Leg: Aquacel Ag Wound #3 Lateral Lower Leg: Aquacel Ag Wound #4 Right,Circumferential Lower Leg: Aquacel Ag Secondary Dressing: Wound #2 Left,Posterior Lower Leg: ABD pad Wound #3 Lateral Lower Leg: ABD pad Wound #4 Right,Circumferential Lower Leg: ABD pad Dressing Change Frequency: Wound #2 Left,Posterior Lower Leg: Change Dressing Monday, Wednesday, Friday Wound #3 Lateral Lower Leg: Change Dressing Monday, Wednesday, Friday Wound #4 Right,Circumferential Lower Leg: Change Dressing Monday, Wednesday, Friday Follow-up Appointments: Wound #2 Left,Posterior Lower Leg: Return Appointment in 1 week. Wound #3 Lateral Lower Leg: Return Appointment in 1 week. Wound #4 Right,Circumferential Lower Leg: JADIA, CAPERS ([id number]) Return Appointment in 1 week. Edema Control: Wound #2 Left,Posterior Lower Leg: 3 Layer Compression System - Bilateral - anchor with unna Elevate legs to the level of  the heart and pump ankles as often as possible - in recliner and or in bed Wound #3 Lateral Lower Leg: 3 Layer Compression System - Bilateral - anchor with unna Elevate legs to the level of the heart and pump ankles as often as possible Wound #4 Right,Circumferential Lower Leg: 3 Layer Compression System - Bilateral - anchor with unna Elevate legs to the level of the heart and pump ankles as often as possible Home Health: Wound #2 Left,Posterior Lower Leg: Continue Home Health Visits - [facility] *****Please go wrap legs and do dressing changes on Wednesdays and Fridays and pt will be seen in clinic on Mondays***** Home Health Nurse may visit PRN to address patient s wound care needs. FACE TO FACE ENCOUNTER: MEDICARE and MEDICAID PATIENTS: I certify that this patient is under my care and that I had a face-to-face encounter that meets the physician face-to-face encounter requirements with this patient on this date. The encounter with the patient was in whole or in part for the following MEDICAL CONDITION: (primary reason for Home Healthcare) MEDICAL NECESSITY: I certify, that based on my findings, NURSING services are a medically necessary home health service. HOME BOUND STATUS: I certify that my clinical findings support that this patient is homebound (i.e., Due to illness or injury, pt requires aid of supportive devices such as crutches, cane, wheelchairs, walkers, the use of special transportation or the assistance of another person to leave their place of residence. There is a normal inability to leave the home and doing so requires considerable and taxing effort. Other absences are for medical reasons / religious services and are infrequent or of short duration when for other reasons). If current dressing causes regression in wound condition, may D/C ordered dressing product/s and apply Normal Saline Moist Dressing daily until next Wound Healing Center / Other MD appointment. Notify  Wound Healing Center of regression in wound condition at [phone]. Please direct any NON-WOUND related issues/requests for orders to patient's Primary Care Physician Wound #3 Lateral Lower Leg: Continue Home Health Visits - [facility] *****Please go wrap legs and do dressing changes on Wednesdays and Fridays and pt will be seen in clinic on Mondays***** Home Health Nurse may visit PRN to address patient s wound care needs. FACE TO FACE ENCOUNTER: MEDICARE and MEDICAID PATIENTS: I certify that this patient is under my care and that I had a face-to-face encounter that meets the physician face-to-face encounter requirements with this patient on this date. The encounter with the patient was in whole or in part for the following MEDICAL CONDITION: (primary reason for Home Healthcare) MEDICAL NECESSITY: I certify, that based on  my findings, NURSING services are a medically necessary home health service. HOME BOUND STATUS: I certify that my clinical findings support that this patient is homebound (i.e., Due to illness or injury, pt requires aid of supportive devices such as crutches, cane, wheelchairs, walkers, the use of special transportation or the assistance of another person to leave their place of residence. There is a normal inability to leave the home and doing so requires considerable and taxing effort. Other absences are for medical reasons / religious services and are infrequent or of short duration when for other reasons). If current dressing causes regression in wound condition, may D/C ordered dressing product/s and apply Normal Saline Moist Dressing daily until next Wound Healing Center / Other MD appointment. Notify Wound Healing Center of regression in wound condition at [phone]. Please direct any NON-WOUND related issues/requests for orders to patient's Primary Care Physician Wound #4 Right,Circumferential Lower Leg: Continue Home Health Visits - [facility] *****Please go wrap  legs and do dressing changes on Wednesdays and Fridays and pt will be seen in clinic on Mondays***** THOMAS, RHUDE ([id number]) Home Health Nurse may visit PRN to address patient s wound care needs. FACE TO FACE ENCOUNTER: MEDICARE and MEDICAID PATIENTS: I certify that this patient is under my care and that I had a face-to-face encounter that meets the physician face-to-face encounter requirements with this patient on this date. The encounter with the patient was in whole or in part for the following MEDICAL CONDITION: (primary reason for Home Healthcare) MEDICAL NECESSITY: I certify, that based on my findings, NURSING services are a medically necessary home health service. HOME BOUND STATUS: I certify that my clinical findings support that this patient is homebound (i.e., Due to illness or injury, pt requires aid of supportive devices such as crutches, cane, wheelchairs, walkers, the use of special transportation or the assistance of another person to leave their place of residence. There is a normal inability to leave the home and doing so requires considerable and taxing effort. Other absences are for medical reasons / religious services and are infrequent or of short duration when for other reasons). If current dressing causes regression in wound condition, may D/C ordered dressing product/s and apply Normal Saline Moist Dressing daily until next Wound Healing Center / Other MD appointment. Notify Wound Healing Center of regression in wound condition at [phone]. Please direct any NON-WOUND related issues/requests for orders to patient's Primary Care Physician Consults ordered were: Podiatry - Pt needs toes nails cut. Facility needs to call and make this appt. This 82 year old patient who was seen by as earlier 3 months ago has now returned with recurrent ulcers both lower extremities because of noncompliance of using her wraps and lymphedema pumps. We have requested her reports from the  vascular office. In the meanwhile I have recommended elevation and exercise and we will use Aquacel Ag and a 3 layer Profore wrap Results to come back and see [place] every week and her caregiver says she will be compliant Electronic Signature(s) Signed: 10/13/2015 3:59:20 PM By: Evlyn Kanner MD, FACS Previous Signature: 10/13/2015 3:48:30 PM Version By: Evlyn Kanner MD, FACS Entered By: Evlyn Kanner on 10/13/2015 15:59:20 Heather Zuniga ([id number]) -------------------------------------------------------------------------------- ROS/PFSH Details Patient Name: Heather Zuniga Date of Service: 10/13/2015 2:15 PM Medical Record Number: [id number] Patient Account Number: [id number] Date of Birth/Sex: [birth year] (82 y.o. Female) Treating RN: Phillis Haggis Primary Care Physician: Bjorn Pippin Other Clinician: Referring Physician: Bjorn Pippin Treating Physician/Extender: Rudene Re in Treatment: 0 Information Obtained From Patient  Wound History Do you currently have one or more open woundso Yes How many open wounds do you currently haveo 3 Approximately how long have you had your woundso 2 weeks How have you been treating your wound(s) until nowo unna boots Has your wound(s) ever healed and then re-openedo Yes Have you had any lab work done in the past montho No Have you tested positive for an antibiotic resistant organism (MRSA, VRE)o No Have you tested positive for osteomyelitis (bone infection)o No Have you had any tests for circulation on your legso Yes Who ordered the testo Dr. Meyer Russel Where was the test doneo [facility] Have you had other problems associated with your woundso Swelling Eyes Medical History: Positive for: Cataracts; Glaucoma Past Medical History Notes: blind in right eye Hematologic/Lymphatic Medical History: Positive for: Anemia - iron Cardiovascular Medical History: Positive for: Congestive Heart Failure Musculoskeletal Medical History: Positive for:  Osteoarthritis Oncologic MAYTE, DIERS ([id number]) Medical History: Negative for: Received Chemotherapy; Received Radiation Psychiatric Medical History: Negative for: Anorexia/bulimia; Confinement Anxiety Past Medical History Notes: depression HBO Extended History Items Eyes: Eyes: Cataracts Glaucoma Immunizations Immunization Notes: up to date Hospitalization / Surgery History Name of Hospital Purpose of Hospitalization/Surgery Date [facility] wound on back 05/09/2012 Family and Social History Cancer: No; Diabetes: No; Heart Disease: No; Hereditary Spherocytosis: No; Hypertension: No; Kidney Disease: No; Lung Disease: No; Seizures: No; Stroke: No; Thyroid Problems: No; Tuberculosis: No; Never smoker; Marital Status - Divorced; Alcohol Use: Never; Drug Use: No History; Caffeine Use: Never; Financial Concerns: No; Food, Clothing or Shelter Needs: No; Support System Lacking: No; Transportation Concerns: No; Advanced Directives: No; Patient does not want information on Advanced Directives Physician Affirmation I have reviewed and agree with the above information. Electronic Signature(s) Signed: 10/13/2015 3:47:10 PM By: Evlyn Kanner MD, FACS Signed: 10/14/2015 5:37:49 PM By: Alejandro Mulling Entered By: Evlyn Kanner on 10/13/2015 15:47:10 MARIMAR, SUBER ([id number]) -------------------------------------------------------------------------------- SuperBill Details Patient Name: Heather Zuniga Date of Service: 10/13/2015 Medical Record Number: [id number] Patient Account Number: [id number] Date of Birth/Sex: 04/12/1923 (82 y.o. Female) Treating RN: Phillis Haggis Primary Care Physician: Bjorn Pippin Other Clinician: Referring Physician: Bjorn Pippin Treating Physician/Extender: Rudene Re in Treatment: 0 Diagnosis Coding ICD-10 Codes Code Description I89.0 Lymphedema, not elsewhere classified L97.221 Non-pressure chronic ulcer of left calf limited to breakdown of skin L97.211  Non-pressure chronic ulcer of right calf limited to breakdown of skin E66.01 Morbid (severe) obesity due to excess calories N18.3 Chronic kidney disease, stage 3 (moderate) Facility Procedures CPT4 Code: 08657846 Description: 96295 - WOUND CARE VISIT-LEV 5 EST PT Modifier: Quantity: 1 Physician Procedures CPT4 Code Description: 2841324 40102 - WC PHYS LEVEL 4 - EST PT ICD-10 Description Diagnosis I89.0 Lymphedema, not elsewhere classified L97.221 Non-pressure chronic ulcer of left calf limited to L97.211 Non-pressure chronic ulcer of right calf limited to  N18.3 Chronic kidney disease, stage 3 (moderate) Modifier: breakdown of ski breakdown of sk Quantity: 1 n in Electronic Signature(s) Signed: 10/14/2015 5:37:49 PM By: Alejandro Mulling Previous Signature: 10/13/2015 3:49:08 PM Version By: Evlyn Kanner MD, FACS Entered By: Alejandro Mulling on 10/14/2015 17:37:13

## 2015-10-20 ENCOUNTER — Encounter: Payer: Medicare Other | Admitting: Surgery

## 2015-10-20 DIAGNOSIS — I890 Lymphedema, not elsewhere classified: Secondary | ICD-10-CM | POA: Diagnosis not present

## 2015-10-23 NOTE — Progress Notes (Signed)
Heather Zuniga, Heather Zuniga ([id number]) Visit Report for 10/20/2015 Arrival Information Details Patient Name: AEVAH, STANSBERY Date of Service: 10/20/2015 10:45 AM Medical Record Number: [id number] Patient Account Number: [id number] Date of Birth/Sex: [birth year] (82 y.o. Female) Treating RN: Leonard Downing Primary Care Physician: Bjorn Pippin Other Clinician: Referring Physician: Bjorn Pippin Treating Physician/Extender: Rudene Re in Treatment: 1 Visit Information History Since Last Visit All ordered tests and consults were completed: No Patient Arrived: Wheel Chair Added or deleted any medications: No Arrival Time: 10:55 Any new allergies or adverse reactions: No Accompanied By: daughter Had a fall or experienced change in No activities of daily living that may affect Transfer Assistance: Manual risk of falls: Patient Identification Verified: Yes Signs or symptoms of abuse/neglect since last No Secondary Verification Process Yes visito Completed: Hospitalized since last visit: No Patient Requires Transmission-Based No Has Dressing in Place as Prescribed: Yes Precautions: Has Compression in Place as Prescribed: Yes Patient Has Alerts: No Pain Present Now: No Electronic Signature(s) Signed: 10/20/2015 2:46:25 PM By: Lucrezia Starch RN, Sendra Entered By: Lucrezia Starch RN, Sendra on 10/20/2015 10:55:51 Heather Zuniga ([id number]) -------------------------------------------------------------------------------- Encounter Discharge Information Details Patient Name: Heather Zuniga Date of Service: 10/20/2015 10:45 AM Medical Record Number: [id number] Patient Account Number: [id number] Date of Birth/Sex: 04/22/1923 (82 y.o. Female) Treating RN: Phillis Haggis Primary Care Physician: Bjorn Pippin Other Clinician: Referring Physician: Bjorn Pippin Treating Physician/Extender: Rudene Re in Treatment: 1 Encounter Discharge Information Items Discharge Pain Level: 0 Discharge Condition:  Stable Ambulatory Status: Wheelchair Discharge Destination: Nursing Home Transportation: Other caregiver and Accompanied By: daughter Schedule Follow-up Appointment: Yes Medication Reconciliation completed and provided to Patient/Care Yes Vinia Jemmott: Provided on Clinical Summary of Care: 10/20/2015 Form Type Recipient Paper Patient HB Electronic Signature(s) Signed: 10/20/2015 11:58:17 AM By: Francie Massing Entered By: Francie Massing on 10/20/2015 11:58:17 Heather Zuniga ([id number]) -------------------------------------------------------------------------------- Lower Extremity Assessment Details Patient Name: Heather Zuniga Date of Service: 10/20/2015 10:45 AM Medical Record Number: [id number] Patient Account Number: [id number] Date of Birth/Sex: 04-24-1923 (82 y.o. Female) Treating RN: Leonard Downing Primary Care Physician: Bjorn Pippin Other Clinician: Referring Physician: Bjorn Pippin Treating Physician/Extender: Rudene Re in Treatment: 1 Edema Assessment Assessed: [Left: No] [Right: No] E[Left: dema] [Right: :] Calf Left: Right: Point of Measurement: 31 cm From Medial Instep 52 cm 49.5 cm Ankle Left: Right: Point of Measurement: 10 cm From Medial Instep 34 cm 34 cm Vascular Assessment Pulses: Posterior Tibial Dorsalis Pedis Palpable: [Left:Yes] [Right:Yes] Extremity colors, hair growth, and conditions: Extremity Color: [Left:Mottled] [Right:Mottled] Hair Growth on Extremity: [Left:No] [Right:No] Temperature of Extremity: [Left:Cool] [Right:Cool] Toe Nail Assessment Left: Right: Thick: Yes Yes Discolored: No No Deformed: No No Improper Length and Hygiene: No No Electronic Signature(s) Signed: 10/20/2015 2:46:25 PM By: Lucrezia Starch, RN, Sendra Entered By: Lucrezia Starch RN, Sendra on 10/20/2015 10:57:54 Heather Zuniga ([id number]) -------------------------------------------------------------------------------- Multi Wound Chart Details Patient Name: Heather Zuniga Date of  Service: 10/20/2015 10:45 AM Medical Record Number: [id number] Patient Account Number: [id number] Date of Birth/Sex: February 28, 1923 (82 y.o. Female) Treating RN: Phillis Haggis Primary Care Physician: Bjorn Pippin Other Clinician: Referring Physician: Bjorn Pippin Treating Physician/Extender: Rudene Re in Treatment: 1 Vital Signs Height(in): 61 Pulse(bpm): 66 Weight(lbs): 210 Blood Pressure 122/61 (mmHg): Body Mass Index(BMI): 40 Temperature(F): 97.5 Respiratory Rate 18 (breaths/min): Photos: [2:No Photos] [3:No Photos] [4:No Photos] Wound Location: [2:Left Lower Leg - Posterior Lower Leg - Lateral Right Lower Leg -] [4:Circumfernential] Wounding Event: [2:Gradually Appeared] [3:Blister] [4:Gradually Appeared] Primary Etiology: [2:Lymphedema] [3:Lymphedema] [4:To be determined] Comorbid History: [2:Cataracts, Glaucoma, Anemia, Congestive Heart Anemia, Congestive Heart  Anemia, Congestive Heart Failure, Osteoarthritis Failure, Osteoarthritis Failure, Osteoarthritis] [3:Cataracts, Glaucoma,] [4:Cataracts, Glaucoma,] Date Acquired: [2:09/29/2015] [3:09/29/2015] [4:09/29/2015] Weeks of Treatment: [2:1] [3:1] [4:1] Wound Status: [2:Converted] [3:Converted] [4:Open] Measurements L x W x D N/A [3:N/A] [4:14x35x0.1] (cm) Area (cm) : [2:N/A] [3:N/A] [4:384.845] Volume (cm) : [2:N/A] [3:N/A] [4:38.485] % Reduction in Area: [2:N/A] [3:N/A] [4:33.10%] % Reduction in Volume: N/A [3:N/A] [4:33.10%] Classification: [2:Partial Thickness] [3:Partial Thickness] [4:Partial Thickness] Exudate Amount: [2:Large] [3:Large] [4:N/A] Exudate Type: [2:Serosanguineous] [3:Serous] [4:N/A] Exudate Color: [2:red, brown] [3:amber] [4:N/A] Wound Margin: [2:Flat and Intact] [3:Flat and Intact] [4:Flat and Intact] Granulation Amount: [2:Large (67-100%)] [3:Medium (34-66%)] [4:Medium (34-66%)] Granulation Quality: [2:Red, Pink] [3:Red, Pink] [4:Red, Pink] Necrotic Amount: [2:Small (1-33%)] [3:Medium (34-66%)]  [4:Medium (34-66%)] Exposed Structures: [2:Fascia: No Fat: No Tendon: No Muscle: No Joint: No] [3:Fascia: No Fat: No Tendon: No Muscle: No Joint: No] [4:Fascia: No Fat: No Tendon: No Muscle: No Joint: No] Bone: No Bone: No Bone: No Limited to Skin Limited to Skin Limited to Skin Breakdown Breakdown Breakdown Epithelialization: None None None Periwound Skin Texture: Edema: Yes Edema: Yes Edema: Yes Periwound Skin Moist: Yes Maceration: Yes Maceration: Yes Moisture: Moist: Yes Moist: Yes Periwound Skin Color: No Abnormalities Noted No Abnormalities Noted No Abnormalities Noted Temperature: No Abnormality No Abnormality N/A Tenderness on Yes Yes No Palpation: Wound Preparation: Ulcer Cleansing: Ulcer Cleansing: Ulcer Cleansing: Other: Rinsed/Irrigated with Rinsed/Irrigated with soap and water Saline Saline Topical Anesthetic Topical Anesthetic Applied: Other: lidocaine Applied: Other: lidocaine 4% 4% Wound Number: 5 N/A N/A Photos: No Photos N/A N/A Wound Location: Left Lower Leg N/A N/A Wounding Event: Gradually Appeared N/A N/A Primary Etiology: Lymphedema N/A N/A Comorbid History: Cataracts, Glaucoma, N/A N/A Anemia, Congestive Heart Failure, Osteoarthritis Date Acquired: 10/19/2015 N/A N/A Weeks of Treatment: 0 N/A N/A Wound Status: Open N/A N/A Measurements L x W x D 22x14x0.1 N/A N/A (cm) Area (cm) : 241.903 N/A N/A Volume (cm) : 24.19 N/A N/A % Reduction in Area: N/A N/A N/A % Reduction in Volume: N/A N/A N/A Classification: Partial Thickness N/A N/A Exudate Amount: Large N/A N/A Exudate Type: Serous N/A N/A Exudate Color: amber N/A N/A Wound Margin: Flat and Intact N/A N/A Granulation Amount: Medium (34-66%) N/A N/A Granulation Quality: Red, Pink N/A N/A Necrotic Amount: Medium (34-66%) N/A N/A Exposed Structures: Fascia: No N/A N/A Fat: No Tendon: No Muscle: No Joint: No Schnitker, Lucrezia ([id number]) Bone: No Limited to  Skin Breakdown Epithelialization: None N/A N/A Periwound Skin Texture: Edema: No N/A N/A Excoriation: No Induration: No Callus: No Crepitus: No Fluctuance: No Friable: No Rash: No Scarring: No Periwound Skin Maceration: Yes N/A N/A Moisture: Moist: Yes Dry/Scaly: No Periwound Skin Color: Atrophie Blanche: No N/A N/A Cyanosis: No Ecchymosis: No Erythema: No Hemosiderin Staining: No Mottled: No Pallor: No Rubor: No Temperature: N/A N/A N/A Tenderness on No N/A N/A Palpation: Wound Preparation: Ulcer Cleansing: Other: N/A N/A soap and water Topical Anesthetic Applied: None Treatment Notes Electronic Signature(s) Signed: 10/22/2015 4:57:15 PM By: Alejandro Mulling Entered By: Alejandro Mulling on 10/20/2015 11:23:55 Heather Zuniga ([id number]) -------------------------------------------------------------------------------- Multi-Disciplinary Care Plan Details Patient Name: Heather Zuniga Date of Service: 10/20/2015 10:45 AM Medical Record Number: [id number] Patient Account Number: [id number] Date of Birth/Sex: 1923/08/08 (82 y.o. Female) Treating RN: Phillis Haggis Primary Care Physician: Bjorn Pippin Other Clinician: Referring Physician: Bjorn Pippin Treating Physician/Extender: Rudene Re in Treatment: 1 Active Inactive Abuse / Safety / Falls / Self Care Management Nursing Diagnoses: Potential for falls Goals: Patient will remain injury free Date Initiated: 10/13/2015 Goal Status:  Active Interventions: Assess fall risk on admission and as needed Notes: Orientation to the Wound Care Program Nursing Diagnoses: Knowledge deficit related to the wound healing center program Goals: Patient/caregiver will verbalize understanding of the Wound Healing Center Program Date Initiated: 10/13/2015 Goal Status: Active Interventions: Provide education on orientation to the wound center Notes: Pain, Acute or Chronic Nursing Diagnoses: Pain, acute or chronic: actual or  potential Potential alteration in comfort, pain GoalsPAMULA, LUTHER ([id number]) Patient will verbalize adequate pain control and receive pain control interventions during procedures as needed Date Initiated: 10/13/2015 Goal Status: Active Interventions: Assess comfort goal upon admission Complete pain assessment as per visit requirements Notes: Wound/Skin Impairment Nursing Diagnoses: Impaired tissue integrity Goals: Ulcer/skin breakdown will have a volume reduction of 30% by week 4 Date Initiated: 10/13/2015 Goal Status: Active Ulcer/skin breakdown will have a volume reduction of 50% by week 8 Date Initiated: 10/13/2015 Goal Status: Active Ulcer/skin breakdown will have a volume reduction of 80% by week 12 Date Initiated: 10/13/2015 Goal Status: Active Interventions: Assess patient/caregiver ability to obtain necessary supplies Assess ulceration(s) every visit Notes: Electronic Signature(s) Signed: 10/22/2015 4:57:15 PM By: Alejandro Mulling Entered By: Alejandro Mulling on 10/20/2015 11:23:48 Heather Zuniga ([id number]) -------------------------------------------------------------------------------- Pain Assessment Details Patient Name: Heather Zuniga Date of Service: 10/20/2015 10:45 AM Medical Record Number: [id number] Patient Account Number: [id number] Date of Birth/Sex: 03-Oct-1922 (82 y.o. Female) Treating RN: Leonard Downing Primary Care Physician: Bjorn Pippin Other Clinician: Referring Physician: Bjorn Pippin Treating Physician/Extender: Rudene Re in Treatment: 1 Active Problems Location of Pain Severity and Description of Pain Patient Has Paino No Site Locations Rate the pain. Current Pain Level: 0 Pain Management and Medication Current Pain Management: Electronic Signature(s) Signed: 10/20/2015 2:46:25 PM By: Lucrezia Starch, RN, Sendra Entered By: Lucrezia Starch RN, Sendra on 10/20/2015 11:02:41 MELIANA, CANNER  ([id number]) -------------------------------------------------------------------------------- Patient/Caregiver Education Details Patient Name: Heather Zuniga Date of Service: 10/20/2015 10:45 AM Medical Record Number: [id number] Patient Account Number: [id number] Date of Birth/Gender: October 11, 1922 (82 y.o. Female) Treating RN: Phillis Haggis Primary Care Physician: Bjorn Pippin Other Clinician: Referring Physician: Bjorn Pippin Treating Physician/Extender: Rudene Re in Treatment: 1 Education Assessment Education Provided To: Patient and Caregiver Education Topics Provided Wound/Skin Impairment: Handouts: Other: do not get wraps wet Methods: Demonstration, Explain/Verbal Responses: State content correctly Electronic Signature(s) Signed: 10/22/2015 4:57:15 PM By: Alejandro Mulling Entered By: Alejandro Mulling on 10/20/2015 11:28:31 Heather Zuniga ([id number]) -------------------------------------------------------------------------------- Wound Assessment Details Patient Name: Heather Zuniga Date of Service: 10/20/2015 10:45 AM Medical Record Number: [id number] Patient Account Number: [id number] Date of Birth/Sex: 1922/10/23 (82 y.o. Female) Treating RN: Leonard Downing Primary Care Physician: Bjorn Pippin Other Clinician: Referring Physician: Bjorn Pippin Treating Physician/Extender: Rudene Re in Treatment: 1 Wound Status Wound Number: 2 Primary Lymphedema Etiology: Wound Location: Left Lower Leg - Posterior Wound Converted Wounding Event: Gradually Appeared Status: Date Acquired: 09/29/2015 Comorbid Cataracts, Glaucoma, Anemia, Weeks Of Treatment: 1 History: Congestive Heart Failure, Clustered Wound: No Osteoarthritis Wound Measurements % Reduction in Area: % Reduction in Volume: Epithelialization: None Wound Description Classification: Partial Thickness Wound Margin: Flat and Intact Exudate Amount: Large Exudate Type: Serosanguineous Exudate Color: red,  brown Foul Odor After Cleansing: No Wound Bed Granulation Amount: Large (67-100%) Exposed Structure Granulation Quality: Red, Pink Fascia Exposed: No Necrotic Amount: Small (1-33%) Fat Layer Exposed: No Necrotic Quality: Adherent Slough Tendon Exposed: No Muscle Exposed: No Joint Exposed: No Bone Exposed: No Limited to Skin Breakdown Periwound Skin Texture Texture Color No Abnormalities Noted: No No Abnormalities Noted: No Localized Edema: Yes Temperature /  Pain Moisture Temperature: No Abnormality No Abnormalities Noted: No Tenderness on Palpation: Yes Moist: Yes Wound Preparation Spiering, Salayah ([id number]) Ulcer Cleansing: Rinsed/Irrigated with Saline Topical Anesthetic Applied: Other: lidocaine 4%, Electronic Signature(s) Signed: 10/20/2015 2:46:25 PM By: Lucrezia Starch RN, Sendra Entered By: Lucrezia Starch RN, Sendra on 10/20/2015 11:01:57 Heather Zuniga ([id number]) -------------------------------------------------------------------------------- Wound Assessment Details Patient Name: Heather Zuniga Date of Service: 10/20/2015 10:45 AM Medical Record Number: [id number] Patient Account Number: [id number] Date of Birth/Sex: 11/10/1922 (82 y.o. Female) Treating RN: Leonard Downing Primary Care Physician: Bjorn Pippin Other Clinician: Referring Physician: Bjorn Pippin Treating Physician/Extender: Rudene Re in Treatment: 1 Wound Status Wound Number: 3 Primary Lymphedema Etiology: Wound Location: Lower Leg - Lateral Wound Converted Wounding Event: Blister Status: Date Acquired: 09/29/2015 Comorbid Cataracts, Glaucoma, Anemia, Weeks Of Treatment: 1 History: Congestive Heart Failure, Clustered Wound: No Osteoarthritis Wound Measurements % Reduction in Area: % Reduction in Volume: Epithelialization: None Wound Description Classification: Partial Thickness Wound Margin: Flat and Intact Exudate Amount: Large Exudate Type: Serous Exudate Color: amber Foul Odor After  Cleansing: No Wound Bed Granulation Amount: Medium (34-66%) Exposed Structure Granulation Quality: Red, Pink Fascia Exposed: No Necrotic Amount: Medium (34-66%) Fat Layer Exposed: No Necrotic Quality: Adherent Slough Tendon Exposed: No Muscle Exposed: No Joint Exposed: No Bone Exposed: No Limited to Skin Breakdown Periwound Skin Texture Texture Color No Abnormalities Noted: No No Abnormalities Noted: No Localized Edema: Yes Temperature / Pain Moisture Temperature: No Abnormality No Abnormalities Noted: No Tenderness on Palpation: Yes Maceration: Yes Moist: Yes Hone, Tiah ([id number]) Wound Preparation Ulcer Cleansing: Rinsed/Irrigated with Saline Topical Anesthetic Applied: Other: lidocaine 4%, Electronic Signature(s) Signed: 10/20/2015 2:46:25 PM By: Lucrezia Starch RN, Sendra Entered By: Lucrezia Starch RN, Sendra on 10/20/2015 11:02:19 KADISHA, GOODINE ([id number]) -------------------------------------------------------------------------------- Wound Assessment Details Patient Name: Heather Zuniga Date of Service: 10/20/2015 10:45 AM Medical Record Number: [id number] Patient Account Number: [id number] Date of Birth/Sex: Mar 17, 1923 (82 y.o. Female) Treating RN: Leonard Downing Primary Care Physician: Bjorn Pippin Other Clinician: Referring Physician: Bjorn Pippin Treating Physician/Extender: Rudene Re in Treatment: 1 Wound Status Wound Number: 4 Primary To be determined Etiology: Wound Location: Right Lower Leg - Circumfernential Wound Open Status: Wounding Event: Gradually Appeared Comorbid Cataracts, Glaucoma, Anemia, Date Acquired: 09/29/2015 History: Congestive Heart Failure, Weeks Of Treatment: 1 Osteoarthritis Clustered Wound: No Wound Measurements Length: (cm) 14 Width: (cm) 35 Depth: (cm) 0.1 Area: (cm) 384.845 Volume: (cm) 38.485 % Reduction in Area: 33.1% % Reduction in Volume: 33.1% Epithelialization: None Tunneling: No Undermining: No Wound  Description Classification: Partial Thickness Wound Margin: Flat and Intact Wound Bed Granulation Amount: Medium (34-66%) Exposed Structure Granulation Quality: Red, Pink Fascia Exposed: No Necrotic Amount: Medium (34-66%) Fat Layer Exposed: No Necrotic Quality: Adherent Slough Tendon Exposed: No Muscle Exposed: No Joint Exposed: No Bone Exposed: No Limited to Skin Breakdown Periwound Skin Texture Texture Color No Abnormalities Noted: No No Abnormalities Noted: No Localized Edema: Yes Moisture No Abnormalities Noted: No Maceration: Yes Moist: Yes Pidgeon, Kalena ([id number]) Wound Preparation Ulcer Cleansing: Other: soap and water, Treatment Notes Wound #4 (Right, Circumferential Lower Leg) 1. Cleansed with: Cleanse wound with antibacterial soap and water 2. Anesthetic Topical Lidocaine 4% cream to wound bed prior to debridement 3. Peri-wound Care: Barrier cream 4. Dressing Applied: Aquacel Ag 5. Secondary Dressing Applied ABD Pad 7. Secured with Tape 4 Layer Compression System - Bilateral Electronic Signature(s) Signed: 10/20/2015 2:46:25 PM By: Lucrezia Starch, RN, Sendra Entered By: Lucrezia Starch RN, Sendra on 10/20/2015 11:01:24 Heather Zuniga ([id number]) -------------------------------------------------------------------------------- Wound Assessment Details Patient Name: Heather Zuniga Date of  Service: 10/20/2015 10:45 AM Medical Record Number: [id number] Patient Account Number: [id number] Date of Birth/Sex: 1923/01/18 (82 y.o. Female) Treating RN: Leonard Downing Primary Care Physician: Bjorn Pippin Other Clinician: Referring Physician: Bjorn Pippin Treating Physician/Extender: Rudene Re in Treatment: 1 Wound Status Wound Number: 5 Primary Lymphedema Etiology: Wound Location: Left Lower Leg Wound Open Wounding Event: Gradually Appeared Status: Date Acquired: 10/19/2015 Comorbid Cataracts, Glaucoma, Anemia, Weeks Of Treatment: 0 History: Congestive Heart  Failure, Clustered Wound: No Osteoarthritis Wound Measurements Length: (cm) 22 Width: (cm) 14 Depth: (cm) 0.1 Area: (cm) 241.903 Volume: (cm) 24.19 % Reduction in Area: % Reduction in Volume: Epithelialization: None Tunneling: No Undermining: No Wound Description Classification: Partial Thickness Wound Margin: Flat and Intact Exudate Amount: Large Exudate Type: Serous Exudate Color: amber Foul Odor After Cleansing: No Wound Bed Granulation Amount: Medium (34-66%) Exposed Structure Granulation Quality: Red, Pink Fascia Exposed: No Necrotic Amount: Medium (34-66%) Fat Layer Exposed: No Necrotic Quality: Adherent Slough Tendon Exposed: No Muscle Exposed: No Joint Exposed: No Bone Exposed: No Limited to Skin Breakdown Periwound Skin Texture Texture Color No Abnormalities Noted: No No Abnormalities Noted: No Callus: No Atrophie Blanche: No Crepitus: No Cyanosis: No Excoriation: No Ecchymosis: No URICK, Anetria ([id number]) Fluctuance: No Erythema: No Friable: No Hemosiderin Staining: No Induration: No Mottled: No Localized Edema: No Pallor: No Rash: No Rubor: No Scarring: No Moisture No Abnormalities Noted: No Dry / Scaly: No Maceration: Yes Moist: Yes Wound Preparation Ulcer Cleansing: Other: soap and water, Topical Anesthetic Applied: None Electronic Signature(s) Signed: 10/20/2015 2:46:25 PM By: Lucrezia Starch, RN, Sendra Entered By: Lucrezia Starch RN, Sendra on 10/20/2015 11:00:52 Heather Zuniga ([id number]) -------------------------------------------------------------------------------- Vitals Details Patient Name: Heather Zuniga Date of Service: 10/20/2015 10:45 AM Medical Record Number: [id number] Patient Account Number: [id number] Date of Birth/Sex: [birth year] (82 y.o. Female) Treating RN: Ashok Cordia, Debi Primary Care Physician: Bjorn Pippin Other Clinician: Referring Physician: Bjorn Pippin Treating Physician/Extender: Rudene Re in Treatment:  1 Vital Signs Time Taken: 11:05 Temperature (F): 97.5 Height (in): 61 Pulse (bpm): 66 Weight (lbs): 210 Respiratory Rate (breaths/min): 18 Body Mass Index (BMI): 39.7 Blood Pressure (mmHg): 122/61 Reference Range: 80 - 120 mg / dl Electronic Signature(s) Signed: 10/22/2015 4:57:15 PM By: Alejandro Mulling Entered By: Alejandro Mulling on 10/20/2015 11:07:38

## 2015-10-23 NOTE — Progress Notes (Signed)
Heather, Zuniga ([id number]) Visit Report for 10/20/2015 Chief Complaint Document Details Patient Name: Heather Zuniga, Heather Zuniga 10/20/2015 10:45 Date of Service: AM Medical Record [id number] Number: Patient Account Number: [id number] [birth year] (82 y.o. Treating RN: Phillis Haggis Date of Birth/Sex: Female) Other Clinician: Primary Care Physician: Bjorn Pippin Treating Julena Barbour Referring Physician: Bjorn Pippin Physician/Extender: Tania Ade in Treatment: 1 Information Obtained from: Patient Chief Complaint This 82 year old patient has had bilateral lower extremity swelling for over 3 years and now has had an open ulceration on the posterior part of the left leg for several months and a recurrent ulceration on the right lower extremity which she's had for about a week Electronic Signature(s) Signed: 10/20/2015 11:48:35 AM By: Evlyn Kanner MD, FACS Entered By: Evlyn Kanner on 10/20/2015 11:48:35 [name] ([id number]) -------------------------------------------------------------------------------- HPI Details Patient Name: Heather, Zuniga 10/20/2015 10:45 Date of Service: AM Medical Record [id number] Number: Patient Account Number: [id number] [birth year] (82 y.o. Treating RN: Phillis Haggis Date of Birth/Sex: Female) Other Clinician: Primary Care Physician: Laroy Apple Amy Treating Evlyn Kanner Referring Physician: Bjorn Pippin Physician/Extender: Tania Ade in Treatment: 1 History of Present Illness Location: ulcerated area on the left and right posterior leg. She also has bilateral lower extremity swelling Quality: Patient reports experiencing a dull pain to affected area(s). Severity: Patient states wound are getting worse. Duration: Patient has had the wound for > 3 months prior to seeking treatment at the wound center Timing: Pain in wound is Intermittent (comes and goes Context: The wound appeared gradually over time Modifying Factors: Other treatment(s) tried include: she has had doxycycline  and a compression ace wrap applied Associated Signs and Symptoms: Patient reports having increase swelling. HPI Description: she has been seen by others in November of last year and was last seen on December 5 when she was lost to follow-up. I understand she did have a vascular workup done but we do not have these reports. She was prescribed lymphedema pumps which have not been in use as they do not have the personnel to put these on at the nursing home. 82 year old patient sent by her PCP Venora Maples for a stasis ulcer on her left lower leg which she's had for many months. she was also noticed to have recently, leg swelling and oozing. A past medical history significant for GERD, chronic kidney disease stage III,anemia, arthritis, pyuria, edema and depression. She has never been a smoker. 10/20/2015 -- was seen by Dr. Gilda Crease on 07/28/2015. ABIs were difficult to obtain but toe tracings were normal and TBI was normal. Right ABI was 0.99 and left TBI was 0.93. Duplex ultrasound of the lower extremity showed normal deep venous system superficial reflux was not present. No surgery or intervention was recommended and was asked to continue to wear compression stockings 20-30 mm variety and a lymph pump was to be advised Electronic Signature(s) Signed: 10/20/2015 11:49:01 AM By: Evlyn Kanner MD, FACS Entered By: Evlyn Kanner on 10/20/2015 11:49:01 TINYA, HORENSTEIN ([id number]) -------------------------------------------------------------------------------- Physical Exam Details Patient Name: Heather, Zuniga 10/20/2015 10:45 Date of Service: AM Medical Record [id number] Number: Patient Account Number: [id number] Jul 12, 1923 (82 y.o. Treating RN: Phillis Haggis Date of Birth/Sex: Female) Other Clinician: Primary Care Physician: Laroy Apple Amy Treating Evlyn Kanner Referring Physician: Bjorn Pippin Physician/Extender: Weeks in Treatment: 1 Constitutional . Pulse regular. Respirations normal and  unlabored. Afebrile. . Eyes Nonicteric. Reactive to light. Ears, Nose, Mouth, and Throat Lips, teeth, and gums WNL.Marland Kitchen Moist mucosa without lesions. Neck supple and nontender. No palpable supraclavicular or cervical adenopathy. Normal sized without goiter. Respiratory WNL.  No retractions.. Cardiovascular Pedal Pulses WNL. No clubbing, cyanosis or edema. Lymphatic No adneopathy. No adenopathy. No adenopathy. Musculoskeletal Adexa without tenderness or enlargement.. Digits and nails w/o clubbing, cyanosis, infection, petechiae, ischemia, or inflammatory conditions.. Integumentary (Hair, Skin) No suspicious lesions. No crepitus or fluctuance. No peri-wound warmth or erythema. No masses.Marland Kitchen Psychiatric Judgement and insight Intact.. No evidence of depression, anxiety, or agitation.. Notes the lymphedema persists and the ulceration superficially bilaterally continue to have some moist drainage and slough. No debridement was done today. Electronic Signature(s) Signed: 10/20/2015 11:49:43 AM By: Evlyn Kanner MD, FACS Entered By: Evlyn Kanner on 10/20/2015 11:49:42 ANTWONETTE, FELIZ ([id number]) -------------------------------------------------------------------------------- Physician Orders Details Patient Name: Heather, Zuniga 10/20/2015 10:45 Date of Service: AM Medical Record [id number] Number: Patient Account Number: [id number] 01/29/1923 (82 y.o. Treating RN: Phillis Haggis Date of Birth/Sex: Female) Other Clinician: Primary Care Physician: Laroy Apple Amy Treating Evlyn Kanner Referring Physician: Bjorn Pippin Physician/Extender: Tania Ade in Treatment: 1 Verbal / Phone Orders: Yes Clinician: Pinkerton, Debi Read Back and Verified: Yes Diagnosis Coding Wound Cleansing Wound #4 Right,Circumferential Lower Leg o Clean wound with Normal Saline. o Cleanse wound with mild soap and water Anesthetic Wound #4 Right,Circumferential Lower Leg o Topical Lidocaine 4% cream applied to wound  bed prior to debridement Skin Barriers/Peri-Wound Care Wound #4 Right,Circumferential Lower Leg o Barrier cream o Moisturizing lotion - to heels and feet (not between toes) Primary Wound Dressing Wound #4 Right,Circumferential Lower Leg o Aquacel Ag Wound #5 Left,Circumferential Lower Leg o Aquacel Ag Secondary Dressing Wound #4 Right,Circumferential Lower Leg o ABD pad Wound #5 Left,Circumferential Lower Leg o ABD pad Dressing Change Frequency Wound #4 Right,Circumferential Lower Leg o Change Dressing Monday, Wednesday, Friday Wound #5 Left,Circumferential Lower Leg o Change Dressing Monday, Wednesday, Friday KERAH, HARDEBECK ([id number]) Follow-up Appointments Wound #4 Right,Circumferential Lower Leg o Return Appointment in 1 week. Wound #5 Left,Circumferential Lower Leg o Return Appointment in 1 week. Edema Control Wound #4 Right,Circumferential Lower Leg o 4 Layer Compression System - Bilateral - anchor with unna o Elevate legs to the level of the heart and pump ankles as often as possible Wound #5 Left,Circumferential Lower Leg o 4 Layer Compression System - Bilateral - anchor with unna o Elevate legs to the level of the heart and pump ankles as often as possible Additional Orders / Instructions Wound #4 Right,Circumferential Lower Leg o Increase protein intake. o Activity as tolerated o Other: - ok to exercise Wound #5 Left,Circumferential Lower Leg o Increase protein intake. o Activity as tolerated o Other: - ok to exercise Home Health Wound #4 Right,Circumferential Lower Leg o Continue Home Health Visits - [facility] *****Please go wrap legs and do dressing changes on Wednesdays and Fridays and pt will be seen in clinic on Mondays***** o Home Health Nurse may visit PRN to address patientos wound care needs. o FACE TO FACE ENCOUNTER: MEDICARE and MEDICAID PATIENTS: I certify that this patient is under my care and that I  had a face-to-face encounter that meets the physician face-to-face encounter requirements with this patient on this date. The encounter with the patient was in whole or in part for the following MEDICAL CONDITION: (primary reason for Home Healthcare) MEDICAL NECESSITY: I certify, that based on my findings, NURSING services are a medically necessary home health service. HOME BOUND STATUS: I certify that my clinical findings support that this patient is homebound (i.e., Due to illness or injury, pt requires aid of supportive devices such as crutches, cane, wheelchairs, walkers, the use of special transportation or the  assistance of another person to leave their place of residence. There is a normal inability to leave the home and doing so requires considerable and taxing effort. Other absences are for medical reasons / religious services and are infrequent or of short duration when for other reasons). GLENNETTE, BROOK ([id number]) o If current dressing causes regression in wound condition, may D/C ordered dressing product/s and apply Normal Saline Moist Dressing daily until next Wound Healing Center / Other MD appointment. Notify Wound Healing Center of regression in wound condition at [phone]. o Please direct any NON-WOUND related issues/requests for orders to patient's Primary Care Physician Electronic Signature(s) Signed: 10/20/2015 4:11:43 PM By: Evlyn Kanner MD, FACS Signed: 10/22/2015 4:57:15 PM By: Alejandro Mulling Entered By: Alejandro Mulling on 10/20/2015 11:26:45 BERNECE, CASSADAY ([id number]) -------------------------------------------------------------------------------- Problem List Details Patient Name: FLARA, BESLER 10/20/2015 10:45 Date of Service: AM Medical Record [id number] Number: Patient Account Number: [id number] May 29, 1923 (82 y.o. 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.211 Non-pressure chronic ulcer of right calf limited to 10/13/2015 Yes breakdown of skin E66.01 Morbid (severe) obesity due to excess calories 10/13/2015 Yes N18.3 Chronic kidney disease, stage 3 (moderate) 10/13/2015 Yes Inactive Problems Resolved Problems Electronic Signature(s) Signed: 10/20/2015 11:48:27 AM By: Evlyn Kanner MD, FACS Entered By: Evlyn Kanner on 10/20/2015 11:48:27 [name] ([id number]) -------------------------------------------------------------------------------- Progress Note Details Patient Name: TOMORROW, AMORIM 10/20/2015 10:45 Date of Service: AM Medical Record [id number] Number: Patient Account Number: [id number] 02-06-1923 (82 y.o. Treating RN: Phillis Haggis Date of Birth/Sex: Female) Other Clinician: Primary Care Physician: Bjorn Pippin Treating Yarielis Funaro Referring Physician: Bjorn Pippin Physician/Extender: Tania Ade in Treatment: 1 Subjective Chief Complaint Information obtained from Patient This 82 year old patient has had bilateral lower extremity swelling for over 3 years and now has had an open ulceration on the posterior part of the left leg for several months and a recurrent ulceration on the right lower extremity which she's had for about a week History of Present Illness (HPI) The following HPI elements were documented for the patient's wound: Location: ulcerated area on the left and right posterior leg. She also has bilateral lower extremity swelling Quality: Patient reports experiencing a dull pain to affected area(s). Severity: Patient states wound are getting worse. Duration: Patient has had the wound for > 3 months prior to seeking  treatment at the wound center Timing: Pain in wound is Intermittent (comes and goes Context: The wound appeared gradually over time Modifying Factors: Other treatment(s) tried include: she has had doxycycline and a compression ace wrap applied Associated Signs and Symptoms: Patient reports having increase swelling. she has been seen by others in November of last year and was last seen on December 5 when she was lost to follow-up. I understand she did have a vascular workup done but we do not have these reports. She was prescribed lymphedema pumps which have not been in use as they do not have the personnel to put these on at the nursing home. 82 year old patient sent by her PCP Venora Maples for a stasis ulcer on her left lower leg which she's had for many months. she was also noticed to have recently, leg swelling and oozing. A past medical history significant for GERD, chronic kidney disease stage  III,anemia, arthritis, pyuria, edema and depression. She has never been a smoker. 10/20/2015 -- was seen by Dr. Gilda Crease on 07/28/2015. ABIs were difficult to obtain but toe tracings were normal and TBI was normal. Right ABI was 0.99 and left TBI was 0.93. Duplex ultrasound of the lower extremity showed normal deep venous system superficial reflux was not present. No surgery or intervention was recommended and was asked to continue to wear compression stockings 20-30 mm variety and a lymph pump was to be advised Lobban, [place] ([id number]) Objective Constitutional Pulse regular. Respirations normal and unlabored. Afebrile. Vitals Time Taken: 11:05 AM, Height: 61 in, Weight: 210 lbs, BMI: 39.7, Temperature: 97.5 F, Pulse: 66 bpm, Respiratory Rate: 18 breaths/min, Blood Pressure: 122/61 mmHg. Eyes Nonicteric. Reactive to light. Ears, Nose, Mouth, and Throat Lips, teeth, and gums WNL.Marland Kitchen Moist mucosa without lesions. Neck supple and nontender. No palpable supraclavicular or cervical adenopathy.  Normal sized without goiter. Respiratory WNL. No retractions.. Cardiovascular Pedal Pulses WNL. No clubbing, cyanosis or edema. Lymphatic No adneopathy. No adenopathy. No adenopathy. Musculoskeletal Adexa without tenderness or enlargement.. Digits and nails w/o clubbing, cyanosis, infection, petechiae, ischemia, or inflammatory conditions.Marland Kitchen Psychiatric Judgement and insight Intact.. No evidence of depression, anxiety, or agitation.. General Notes: the lymphedema persists and the ulceration superficially bilaterally continue to have some moist drainage and slough. No debridement was done today. Integumentary (Hair, Skin) No suspicious lesions. No crepitus or fluctuance. No peri-wound warmth or erythema. No masses.. Wound #2 status is Converted. Original cause of wound was Gradually Appeared. The wound is located on the Left,Posterior Lower Leg. The wound is limited to skin breakdown. There is a large amount of serosanguineous drainage noted. The wound margin is flat and intact. There is large (67-100%) red, pink granulation within the wound bed. There is a small (1-33%) amount of necrotic tissue within the wound bed including Adherent Slough. The periwound skin appearance exhibited: Localized Edema, Moist. Periwound temperature was noted as No Abnormality. The periwound has tenderness on palpation. LEKITA, KEREKES ([id number]) Wound #3 status is Converted. Original cause of wound was Blister. The wound is located on the Lateral Lower Leg. The wound is limited to skin breakdown. There is a large amount of serous drainage noted. The wound margin is flat and intact. There is medium (34-66%) red, pink granulation within the wound bed. There is a medium (34-66%) amount of necrotic tissue within the wound bed including Adherent Slough. The periwound skin appearance exhibited: Localized Edema, Maceration, Moist. Periwound temperature was noted as No Abnormality. The periwound has tenderness on  palpation. Wound #4 status is Open. Original cause of wound was Gradually Appeared. The wound is located on the Right,Circumferential Lower Leg. The wound measures 14cm length x 35cm width x 0.1cm depth; 384.845cm^2 area and 38.485cm^3 volume. The wound is limited to skin breakdown. There is no tunneling or undermining noted. The wound margin is flat and intact. There is medium (34-66%) red, pink granulation within the wound bed. There is a medium (34-66%) amount of necrotic tissue within the wound bed including Adherent Slough. The periwound skin appearance exhibited: Localized Edema, Maceration, Moist. Wound #5 status is Open. Original cause of wound was Gradually Appeared. The wound is located on the Left,Circumferential Lower Leg. The wound measures 22cm length x 14cm width x 0.1cm depth; 241.903cm^2 area and 24.19cm^3 volume. The wound is limited to skin breakdown. There is no tunneling or undermining noted. There is a large amount of serous drainage noted. The wound margin is flat and intact. There is  medium (34-66%) red, pink granulation within the wound bed. There is a medium (34-66%) amount of necrotic tissue within the wound bed including Adherent Slough. The periwound skin appearance exhibited: Maceration, Moist. The periwound skin appearance did not exhibit: Callus, Crepitus, Excoriation, Fluctuance, Friable, Induration, Localized Edema, Rash, Scarring, Dry/Scaly, Atrophie Blanche, Cyanosis, Ecchymosis, Hemosiderin Staining, Mottled, Pallor, Rubor, Erythema. 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 Cleanse wound with mild soap and water ESLY, SELVAGE ([id number]) Anesthetic: Wound #4 Right,Circumferential Lower Leg: Topical Lidocaine 4% cream applied to wound bed prior to debridement Skin Barriers/Peri-Wound Care: Wound #4 Right,Circumferential Lower Leg: Barrier cream Moisturizing lotion - to heels and feet (not between toes) Primary Wound Dressing: Wound #4 Right,Circumferential Lower Leg: Aquacel Ag Wound #5 Left,Circumferential Lower Leg: Aquacel Ag Secondary Dressing: Wound #4 Right,Circumferential Lower Leg: ABD pad Wound #5 Left,Circumferential Lower Leg: ABD pad Dressing Change Frequency: Wound #4 Right,Circumferential Lower Leg: Change Dressing Monday, Wednesday, Friday Wound #5 Left,Circumferential Lower Leg: Change Dressing Monday, Wednesday, Friday Follow-up Appointments: Wound #4 Right,Circumferential Lower Leg: Return Appointment in 1 week. Wound #5 Left,Circumferential Lower Leg: Return Appointment in 1 week. Edema Control: Wound #4 Right,Circumferential Lower Leg: 4 Layer Compression System - Bilateral - anchor with unna Elevate legs to the level of the heart and pump ankles as often as possible Wound #5 Left,Circumferential Lower Leg: 4 Layer Compression System - Bilateral - anchor with unna Elevate legs to the level of the heart and pump ankles as often as possible Additional Orders / Instructions: Wound #4 Right,Circumferential Lower Leg: Increase protein intake. Activity as tolerated Other: - ok to exercise Wound #5 Left,Circumferential Lower Leg: Increase protein intake. Activity as tolerated Other: - ok to exercise Home Health: Wound #4 Right,Circumferential Lower Leg: Continue Home Health Visits - [facility] *****Please go wrap legs and do dressing changes on Wednesdays and Fridays and pt will be seen in clinic on Mondays***** Home Health Nurse may visit PRN to address patient s wound care needs. FACE TO FACE ENCOUNTER:  MEDICARE and MEDICAID PATIENTS: I certify that this patient is under my care and that I had a face-to-face encounter that meets the physician face-to-face encounter requirements with this patient on this date. The encounter with the patient was in whole or in part for the MAKI, HEGE ([id number]) following MEDICAL CONDITION: (primary reason for Home Healthcare) MEDICAL NECESSITY: I certify, that based on my findings, NURSING services are a medically necessary home health service. HOME BOUND STATUS: I certify that my clinical findings support that this patient is homebound (i.e., Due to illness or injury, pt requires aid of supportive devices such as crutches, cane, wheelchairs, walkers, the use of special transportation or the assistance of another person to leave their place of residence. There is a normal inability to leave the home and doing so requires considerable and taxing effort. Other absences are for medical reasons / religious services and are infrequent or of short duration when for other reasons). If current dressing causes regression in wound condition, may D/C ordered dressing product/s and apply Normal Saline Moist Dressing daily until next Wound Healing Center / Other MD appointment. Notify Wound Healing Center of regression in  wound condition at [phone]. Please direct any NON-WOUND related issues/requests for orders to patient's Primary Care Physician We have requested her reports from the vascular office and these have been reviewed with the patient In the meanwhile I have recommended elevation and exercise and we will use Aquacel Ag and a 4 layer Profore wrap she is to come back and see [place] every week and her caregiver says she will be compliant Electronic Signature(s) Signed: 10/20/2015 11:50:35 AM By: Evlyn Kanner MD, FACS Entered By: Evlyn Kanner on 10/20/2015 11:50:35 [name]  ([id number]) -------------------------------------------------------------------------------- SuperBill Details Patient Name: [name] Date of Service: 10/20/2015 Medical Record Number: [id number] Patient Account Number: [id number] Date of Birth/Sex: February 21, 1923 (82 y.o. 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 Physician Procedures CPT4 Code Description: [id number] - WC PHYS LEVEL 3 - EST PT ICD-10 Description Diagnosis L97.221 Non-pressure chronic ulcer of left calf limited to L97.211 Non-pressure chronic ulcer of right calf limited to I89.0 Lymphedema, not elsewhere classified Modifier: breakdown of sk breakdown of s Quantity: 1 in kin Electronic Signature(s) Signed: 10/22/2015 4:27:39 PM By: Evlyn Kanner MD, FACS Signed: 10/22/2015 4:57:15 PM By: Alejandro Mulling Previous Signature: 10/20/2015 11:50:52 AM Version By: Evlyn Kanner MD, FACS Entered By: Alejandro Mulling on 10/20/2015 16:26:11

## 2015-10-27 ENCOUNTER — Other Ambulatory Visit: Payer: Self-pay

## 2015-10-27 ENCOUNTER — Encounter: Payer: Medicare Other | Admitting: Surgery

## 2015-10-27 ENCOUNTER — Emergency Department: Payer: Medicare Other

## 2015-10-27 ENCOUNTER — Inpatient Hospital Stay: Payer: Medicare Other

## 2015-10-27 ENCOUNTER — Inpatient Hospital Stay
Admission: EM | Admit: 2015-10-27 | Discharge: 2015-10-31 | DRG: 689 | Disposition: A | Discharge status: Home Health | Payer: Medicare Other | Attending: Internal Medicine | Admitting: Internal Medicine

## 2015-10-27 ENCOUNTER — Encounter: Payer: Self-pay | Admitting: *Deleted

## 2015-10-27 DIAGNOSIS — F0280 Dementia in other diseases classified elsewhere without behavioral disturbance: Secondary | ICD-10-CM | POA: Diagnosis present

## 2015-10-27 DIAGNOSIS — Z79899 Other long term (current) drug therapy: Secondary | ICD-10-CM

## 2015-10-27 DIAGNOSIS — K219 Gastro-esophageal reflux disease without esophagitis: Secondary | ICD-10-CM | POA: Diagnosis present

## 2015-10-27 DIAGNOSIS — R531 Weakness: Secondary | ICD-10-CM | POA: Diagnosis present

## 2015-10-27 DIAGNOSIS — I872 Venous insufficiency (chronic) (peripheral): Secondary | ICD-10-CM | POA: Diagnosis present

## 2015-10-27 DIAGNOSIS — L97919 Non-pressure chronic ulcer of unspecified part of right lower leg with unspecified severity: Secondary | ICD-10-CM | POA: Diagnosis present

## 2015-10-27 DIAGNOSIS — F329 Major depressive disorder, single episode, unspecified: Secondary | ICD-10-CM | POA: Diagnosis present

## 2015-10-27 DIAGNOSIS — S81809A Unspecified open wound, unspecified lower leg, initial encounter: Secondary | ICD-10-CM

## 2015-10-27 DIAGNOSIS — N183 Chronic kidney disease, stage 3 (moderate): Secondary | ICD-10-CM | POA: Diagnosis present

## 2015-10-27 DIAGNOSIS — B9561 Methicillin susceptible Staphylococcus aureus infection as the cause of diseases classified elsewhere: Secondary | ICD-10-CM | POA: Diagnosis present

## 2015-10-27 DIAGNOSIS — R443 Hallucinations, unspecified: Secondary | ICD-10-CM | POA: Diagnosis present

## 2015-10-27 DIAGNOSIS — Z7951 Long term (current) use of inhaled steroids: Secondary | ICD-10-CM | POA: Diagnosis not present

## 2015-10-27 DIAGNOSIS — B952 Enterococcus as the cause of diseases classified elsewhere: Secondary | ICD-10-CM | POA: Diagnosis present

## 2015-10-27 DIAGNOSIS — R609 Edema, unspecified: Secondary | ICD-10-CM

## 2015-10-27 DIAGNOSIS — M1990 Unspecified osteoarthritis, unspecified site: Secondary | ICD-10-CM | POA: Diagnosis present

## 2015-10-27 DIAGNOSIS — B9689 Other specified bacterial agents as the cause of diseases classified elsewhere: Secondary | ICD-10-CM | POA: Diagnosis present

## 2015-10-27 DIAGNOSIS — Z6841 Body Mass Index (BMI) 40.0 and over, adult: Secondary | ICD-10-CM | POA: Diagnosis not present

## 2015-10-27 DIAGNOSIS — D649 Anemia, unspecified: Secondary | ICD-10-CM | POA: Diagnosis present

## 2015-10-27 DIAGNOSIS — E669 Obesity, unspecified: Secondary | ICD-10-CM | POA: Diagnosis present

## 2015-10-27 DIAGNOSIS — S81801A Unspecified open wound, right lower leg, initial encounter: Secondary | ICD-10-CM | POA: Diagnosis present

## 2015-10-27 DIAGNOSIS — B9620 Unspecified Escherichia coli [E. coli] as the cause of diseases classified elsewhere: Secondary | ICD-10-CM | POA: Diagnosis present

## 2015-10-27 DIAGNOSIS — N179 Acute kidney failure, unspecified: Secondary | ICD-10-CM | POA: Diagnosis present

## 2015-10-27 DIAGNOSIS — G9340 Encephalopathy, unspecified: Secondary | ICD-10-CM | POA: Diagnosis present

## 2015-10-27 DIAGNOSIS — R680 Hypothermia, not associated with low environmental temperature: Secondary | ICD-10-CM | POA: Diagnosis present

## 2015-10-27 DIAGNOSIS — M7989 Other specified soft tissue disorders: Secondary | ICD-10-CM | POA: Diagnosis present

## 2015-10-27 DIAGNOSIS — T68XXXA Hypothermia, initial encounter: Secondary | ICD-10-CM

## 2015-10-27 DIAGNOSIS — G300 Alzheimer's disease with early onset: Secondary | ICD-10-CM | POA: Diagnosis present

## 2015-10-27 DIAGNOSIS — G309 Alzheimer's disease, unspecified: Secondary | ICD-10-CM | POA: Diagnosis present

## 2015-10-27 DIAGNOSIS — L03115 Cellulitis of right lower limb: Secondary | ICD-10-CM | POA: Diagnosis present

## 2015-10-27 DIAGNOSIS — B965 Pseudomonas (aeruginosa) (mallei) (pseudomallei) as the cause of diseases classified elsewhere: Secondary | ICD-10-CM | POA: Diagnosis present

## 2015-10-27 DIAGNOSIS — N390 Urinary tract infection, site not specified: Principal | ICD-10-CM | POA: Diagnosis present

## 2015-10-27 DIAGNOSIS — R4182 Altered mental status, unspecified: Secondary | ICD-10-CM

## 2015-10-27 DIAGNOSIS — R0602 Shortness of breath: Secondary | ICD-10-CM

## 2015-10-27 DIAGNOSIS — R400 Somnolence: Secondary | ICD-10-CM

## 2015-10-27 HISTORY — DX: Chronic kidney disease, stage 3 unspecified: N18.30

## 2015-10-27 HISTORY — DX: Chronic kidney disease, stage 3 (moderate): N18.3

## 2015-10-27 LAB — COMPREHENSIVE METABOLIC PANEL
ALBUMIN: 3.7 g/dL (ref 3.5–5.0)
ALK PHOS: 143 U/L — AB (ref 38–126)
ALT: 27 U/L (ref 14–54)
AST: 42 U/L — AB (ref 15–41)
Anion gap: 6 (ref 5–15)
BILIRUBIN TOTAL: 0.5 mg/dL (ref 0.3–1.2)
BUN: 51 mg/dL — AB (ref 6–20)
CALCIUM: 8.6 mg/dL — AB (ref 8.9–10.3)
CO2: 25 mmol/L (ref 22–32)
Chloride: 110 mmol/L (ref 101–111)
Creatinine, Ser: 1.57 mg/dL — ABNORMAL HIGH (ref 0.44–1.00)
GFR calc Af Amer: 32 mL/min — ABNORMAL LOW (ref >60)
GFR calc non Af Amer: 28 mL/min — ABNORMAL LOW (ref >60)
GLUCOSE: 84 mg/dL (ref 65–99)
Potassium: 4.3 mmol/L (ref 3.5–5.1)
SODIUM: 141 mmol/L (ref 135–145)
TOTAL PROTEIN: 8 g/dL (ref 6.5–8.1)

## 2015-10-27 LAB — URINALYSIS COMPLETE WITH MICROSCOPIC (ARMC ONLY)
Bilirubin Urine: NEGATIVE
Glucose, UA: NEGATIVE mg/dL
Hgb urine dipstick: NEGATIVE
Ketones, ur: NEGATIVE mg/dL
NITRITE: NEGATIVE
PROTEIN: NEGATIVE mg/dL
Specific Gravity, Urine: 1.01 (ref 1.005–1.030)
pH: 5 (ref 5.0–8.0)

## 2015-10-27 LAB — CBC
HEMATOCRIT: 31 % — AB (ref 35.0–47.0)
HEMOGLOBIN: 10.2 g/dL — AB (ref 12.0–16.0)
MCH: 33.9 pg (ref 26.0–34.0)
MCHC: 33 g/dL (ref 32.0–36.0)
MCV: 102.6 fL — ABNORMAL HIGH (ref 80.0–100.0)
Platelets: 226 10*3/uL (ref 150–440)
RBC: 3.02 MIL/uL — AB (ref 3.80–5.20)
RDW: 14 % (ref 11.5–14.5)
WBC: 6.7 10*3/uL (ref 3.6–11.0)

## 2015-10-27 LAB — SEDIMENTATION RATE: Sed Rate: 107 mm/hr — ABNORMAL HIGH (ref 0–30)

## 2015-10-27 MED ORDER — TORSEMIDE 20 MG PO TABS
20.0000 mg | ORAL_TABLET | Freq: Every day | ORAL | Status: DC
  Administered 2015-10-27 – 2015-10-31 (×5): 20 mg via ORAL
  Filled 2015-10-27 (×6): qty 1

## 2015-10-27 MED ORDER — ALBUTEROL SULFATE HFA 108 (90 BASE) MCG/ACT IN AERS: 2.0000 | INHALATION_SPRAY | RESPIRATORY_TRACT | Status: DC | PRN

## 2015-10-27 MED ORDER — VANCOMYCIN HCL 10 G IV SOLR
1500.0000 mg | Freq: Once | INTRAVENOUS | Status: AC
  Administered 2015-10-27: 1500 mg via INTRAVENOUS
  Filled 2015-10-27: qty 1500

## 2015-10-27 MED ORDER — DEXTROSE 5 % IV SOLN
1.0000 g | INTRAVENOUS | Status: DC
  Administered 2015-10-27 – 2015-10-29 (×3): 1 g via INTRAVENOUS
  Filled 2015-10-27 (×4): qty 10

## 2015-10-27 MED ORDER — POLYETHYLENE GLYCOL 3350 17 G PO PACK: 17.0000 g | PACK | Freq: Every day | ORAL | Status: DC | PRN

## 2015-10-27 MED ORDER — POLYVINYL ALCOHOL 1.4 % OP SOLN
1.0000 [drp] | Freq: Two times a day (BID) | OPHTHALMIC | Status: DC
  Administered 2015-10-27 – 2015-10-31 (×8): 1 [drp] via OPHTHALMIC
  Filled 2015-10-27: qty 15

## 2015-10-27 MED ORDER — BISACODYL 5 MG PO TBEC
5.0000 mg | DELAYED_RELEASE_TABLET | Freq: Every day | ORAL | Status: DC | PRN
  Administered 2015-10-27: 5 mg via ORAL
  Filled 2015-10-27: qty 1

## 2015-10-27 MED ORDER — ENOXAPARIN SODIUM 30 MG/0.3ML ~~LOC~~ SOLN
30.0000 mg | SUBCUTANEOUS | Status: DC
  Administered 2015-10-27 – 2015-10-30 (×4): 30 mg via SUBCUTANEOUS
  Filled 2015-10-27 (×4): qty 0.3

## 2015-10-27 MED ORDER — SPIRONOLACTONE 25 MG PO TABS
25.0000 mg | ORAL_TABLET | Freq: Every day | ORAL | Status: DC
  Administered 2015-10-27 – 2015-10-31 (×5): 25 mg via ORAL
  Filled 2015-10-27 (×5): qty 1

## 2015-10-27 MED ORDER — TIMOLOL MALEATE 0.5 % OP SOLN
1.0000 [drp] | Freq: Every day | OPHTHALMIC | Status: DC
  Administered 2015-10-27 – 2015-10-31 (×5): 1 [drp] via OPHTHALMIC
  Filled 2015-10-27: qty 5

## 2015-10-27 MED ORDER — ONDANSETRON HCL 4 MG PO TABS: 4.0000 mg | ORAL_TABLET | Freq: Four times a day (QID) | ORAL | Status: DC | PRN

## 2015-10-27 MED ORDER — POLYETHYL GLYCOL-PROPYL GLYCOL 0.4-0.3 % OP SOLN: 1.0000 [drp] | Freq: Two times a day (BID) | OPHTHALMIC | Status: DC

## 2015-10-27 MED ORDER — LEVOTHYROXINE SODIUM 25 MCG PO TABS
25.0000 ug | ORAL_TABLET | Freq: Every day | ORAL | Status: DC
  Administered 2015-10-28 – 2015-10-31 (×4): 25 ug via ORAL
  Filled 2015-10-27 (×4): qty 1

## 2015-10-27 MED ORDER — POTASSIUM CHLORIDE CRYS ER 10 MEQ PO TBCR
10.0000 meq | EXTENDED_RELEASE_TABLET | Freq: Every day | ORAL | Status: DC
  Administered 2015-10-27 – 2015-10-31 (×5): 10 meq via ORAL
  Filled 2015-10-27 (×5): qty 1

## 2015-10-27 MED ORDER — ALBUTEROL SULFATE (2.5 MG/3ML) 0.083% IN NEBU: 2.5000 mg | INHALATION_SOLUTION | RESPIRATORY_TRACT | Status: DC | PRN

## 2015-10-27 MED ORDER — LATANOPROST 0.005 % OP SOLN
1.0000 [drp] | Freq: Every day | OPHTHALMIC | Status: DC
  Administered 2015-10-27 – 2015-10-30 (×4): 1 [drp] via OPHTHALMIC
  Filled 2015-10-27: qty 2.5

## 2015-10-27 MED ORDER — ACETAMINOPHEN 325 MG PO TABS
650.0000 mg | ORAL_TABLET | Freq: Four times a day (QID) | ORAL | Status: DC | PRN
  Administered 2015-10-28 (×2): 650 mg via ORAL
  Filled 2015-10-27 (×2): qty 2

## 2015-10-27 MED ORDER — PANTOPRAZOLE SODIUM 40 MG PO TBEC
40.0000 mg | DELAYED_RELEASE_TABLET | Freq: Every day | ORAL | Status: DC
  Administered 2015-10-27 – 2015-10-31 (×5): 40 mg via ORAL
  Filled 2015-10-27 (×5): qty 1

## 2015-10-27 MED ORDER — ONDANSETRON HCL 4 MG/2ML IJ SOLN: 4.0000 mg | Freq: Four times a day (QID) | INTRAMUSCULAR | Status: DC | PRN

## 2015-10-27 MED ORDER — ACETAMINOPHEN 650 MG RE SUPP: 650.0000 mg | Freq: Four times a day (QID) | RECTAL | Status: DC | PRN

## 2015-10-27 NOTE — Progress Notes (Addendum)
Heather Zuniga, Heather Zuniga ([id number]) Visit Report for 10/27/2015 Chief Complaint Document Details Patient Name: Heather Zuniga, Heather Zuniga 10/27/2015 10:15 Date of Service: AM Medical Record [id number] Number: Patient Account Number: [id number] Nov 19, 1922 (82 y.o. Treating RN: Phillis Haggis Date of Birth/Sex: Female) Other Clinician: Primary Care Physician: Bjorn Pippin Treating Rahmah Mccamy Referring Physician: Bjorn Pippin Physician/Extender: Tania Ade in Treatment: 2 Information Obtained from: Patient Chief Complaint This 82 year old patient has had bilateral lower extremity swelling for over 3 years and now has had an open ulceration on the posterior part of the left leg for several months and a recurrent ulceration on the right lower extremity which she's had for about a week Electronic Signature(s) Signed: 10/27/2015 11:15:57 AM By: Evlyn Kanner MD, FACS Entered By: Evlyn Kanner on 10/27/2015 11:15:57 [name] ([id number]) -------------------------------------------------------------------------------- HPI Details Patient Name: Heather Zuniga, Heather Zuniga 10/27/2015 10:15 Date of Service: AM Medical Record [id number] Number: Patient Account Number: [id number] 1923/01/29 (82 y.o. Treating RN: Phillis Haggis Date of Birth/Sex: Female) Other Clinician: Primary Care Physician: Laroy Apple Amy Treating Evlyn Kanner Referring Physician: Bjorn Pippin Physician/Extender: Tania Ade in Treatment: 2 History of Present Illness Location: ulcerated area on the left and right posterior leg. She also has bilateral lower extremity swelling Quality: Patient reports experiencing a dull pain to affected area(s). Severity: Patient states wound are getting worse. Duration: Patient has had the wound for > 3 months prior to seeking treatment at the wound center Timing: Pain in wound is Intermittent (comes and goes Context: The wound appeared gradually over time Modifying Factors: Other treatment(s) tried include: she has had doxycycline  and a compression ace wrap applied Associated Signs and Symptoms: Patient reports having increase swelling. HPI Description: She has been seen by others in November of last year and was last seen on December 5 when she was lost to follow-up. I understand she did have a vascular workup done but we do not have these reports. She was prescribed lymphedema pumps which have not been in use as they do not have the personnel to put these on at the nursing home. 82 year old patient sent by her PCP Venora Maples for a stasis ulcer on her left lower leg which she's had for many months. she was also noticed to have recently, leg swelling and oozing. A past medical history significant for GERD, chronic kidney disease stage III,anemia, arthritis, pyuria, edema and depression. She has never been a smoker. 10/20/2015 -- was seen by Dr. Gilda Crease on 07/28/2015. ABIs were difficult to obtain but toe tracings were normal and TBI was normal. Right ABI was 0.99 and left TBI was 0.93. Duplex ultrasound of the lower extremity showed normal deep venous system superficial reflux was not present. No surgery or intervention was recommended and was asked to continue to wear compression stockings 20-30 mm variety and a lymph pump was to be advised. Electronic Signature(s) Signed: 10/27/2015 11:16:45 AM By: Evlyn Kanner MD, FACS Entered By: Evlyn Kanner on 10/27/2015 11:16:45 Heather Zuniga, Heather Zuniga ([id number]) -------------------------------------------------------------------------------- Physical Exam Details Patient Name: Heather Zuniga, Heather Zuniga 10/27/2015 10:15 Date of Service: AM Medical Record [id number] Number: Patient Account Number: [id number] Jul 26, 1923 (82 y.o. Treating RN: Phillis Haggis Date of Birth/Sex: Female) Other Clinician: Primary Care Physician: Laroy Apple Amy Treating Evlyn Kanner Referring Physician: Bjorn Pippin Physician/Extender: Weeks in Treatment: 2 Constitutional . Pulse regular. Respirations normal and  unlabored. Afebrile. . Eyes Nonicteric. Reactive to light. Ears, Nose, Mouth, and Throat Lips, teeth, and gums WNL.Marland Kitchen Moist mucosa without lesions. Neck supple and nontender. No palpable supraclavicular or cervical adenopathy. Normal sized without goiter. Respiratory WNL.  No retractions.. Cardiovascular Pedal Pulses WNL. No clubbing, cyanosis or edema. Lymphatic No adneopathy. No adenopathy. No adenopathy. Musculoskeletal Adexa without tenderness or enlargement.. Digits and nails w/o clubbing, cyanosis, infection, petechiae, ischemia, or inflammatory conditions.. Integumentary (Hair, Skin) No suspicious lesions. No crepitus or fluctuance. No peri-wound warmth or erythema. No masses.Marland Kitchen Psychiatric Judgement and insight Intact.. No evidence of depression, anxiety, or agitation.. Notes the lymphedema continues to be significant and I believe her dressings were not changed appropriately so the right leg was more moist than the left. I have checked the Doppler pulses both lower extremities and she's got a brisk signal which is triphasic in nature. Electronic Signature(s) Signed: 10/27/2015 11:32:56 AM By: Evlyn Kanner MD, FACS Entered By: Evlyn Kanner on 10/27/2015 11:32:55 Heather Zuniga, Heather Zuniga ([id number]) -------------------------------------------------------------------------------- Physician Orders Details Patient Name: Heather Zuniga, Heather Zuniga 10/27/2015 10:15 Date of Service: AM Medical Record [id number] Number: Patient Account Number: [id number] [birth year] (82 y.o. 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 o Cleanse wound with mild soap and water Anesthetic Wound #4 Right,Circumferential Lower Leg o Topical Lidocaine 4% cream applied to wound bed prior to debridement Skin Barriers/Peri-Wound Care Wound #4 Right,Circumferential Lower Leg o Barrier cream o Moisturizing lotion - to heels and feet (not between toes) Primary Wound Dressing Wound #4 Right,Circumferential Lower Leg o Aquacel Ag Wound #5 Left,Circumferential Lower Leg o Aquacel Ag Secondary Dressing Wound #4 Right,Circumferential Lower Leg o ABD pad o Zyra Parrillo, Sharmin ([id number]) Wound #5 Left,Circumferential Lower Leg o ABD pad o XtraSorb Dressing Change Frequency Wound #4 Right,Circumferential Lower Leg o Change Dressing Monday, Wednesday, Friday - when you change the dressing please wash pts leg with warm soapy water and dry well Wound #5 Left,Circumferential Lower Leg o Change Dressing Monday, Wednesday, Friday - when you change the dressing please wash pts leg with warm soapy water and dry well Follow-up Appointments Wound #4 Right,Circumferential Lower Leg o Return Appointment in 1 week. Wound #5 Left,Circumferential Lower Leg o Return Appointment in 1 week. Edema Control Wound #4 Right,Circumferential Lower Leg o 4 Layer Compression System - Bilateral - anchor with unna o Elevate legs to the level of the heart and pump ankles as often as possible Wound #5 Left,Circumferential Lower Leg o 4 Layer Compression System - Bilateral - anchor with unna o Elevate legs to the level of the heart and pump ankles as often as possible Additional Orders / Instructions Wound #4  Right,Circumferential Lower Leg o Increase protein intake. o Activity as tolerated o Other: - ok to exercise Wound #5 Left,Circumferential Lower Leg o Increase protein intake. o Activity as tolerated o Other: - ok to exercise Home Health Wound #4 Right,Circumferential Lower Leg o Continue Home Health Visits - [facility] *****Please go wrap legs and do dressing changes on Wednesdays and Fridays and pt will be seen in clinic on Mondays***** o Home Health Nurse may visit PRN to address patientos wound care needs. MIYOSHI, LIGAS ([id number]) o FACE TO FACE  ENCOUNTER: MEDICARE and MEDICAID PATIENTS: I certify that this patient is under my care and that I had a face-to-face encounter that meets the physician face-to-face encounter requirements with this patient on this date. The encounter with the patient was in whole or in part for the following MEDICAL CONDITION: (primary reason for Home Healthcare) MEDICAL NECESSITY: I certify, that based on my findings, NURSING services are a medically necessary home health service. HOME BOUND STATUS: I certify that my clinical findings support that this patient is homebound (i.e., Due to illness or injury, pt requires aid of supportive devices such as crutches, cane, wheelchairs, walkers, the use of special transportation or the assistance of another person to leave their place of residence. There is a normal inability to leave the home and doing so requires considerable and taxing effort. Other absences are for medical reasons / religious services and are infrequent or of short duration when for other reasons). o If current dressing causes regression in wound condition, may D/C ordered dressing product/s and apply Normal Saline Moist Dressing daily until next Wound Healing Center / Other MD appointment. Notify Wound Healing Center of regression in wound condition at [phone]. o Please direct any NON-WOUND related issues/requests  for orders to patient's Primary Care Physician Electronic Signature(s) Signed: 10/27/2015 4:23:25 PM By: Evlyn Kanner MD, FACS Signed: 10/27/2015 4:58:45 PM By: Alejandro Mulling Entered By: Alejandro Mulling on 10/27/2015 11:30:20 Heather Zuniga, Heather Zuniga ([id number]) -------------------------------------------------------------------------------- Problem List Details Patient Name: Heather Zuniga, Heather Zuniga 10/27/2015 10:15 Date of Service: AM Medical Record [id number] Number: Patient Account Number: [id number] 11/08/1922 (82 y.o. 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 right calf limited to 10/13/2015 Yes breakdown of skin E66.01 Morbid (severe) obesity due to excess calories 10/13/2015 Yes N18.3 Chronic kidney disease, stage 3 (moderate) 10/13/2015 Yes Inactive Problems Resolved Problems Electronic Signature(s) Signed: 10/27/2015 11:15:43 AM By: Evlyn Kanner MD, FACS Entered By: Evlyn Kanner on 10/27/2015 11:15:43 [name] ([id number]) -------------------------------------------------------------------------------- Progress Note Details Patient Name: Heather Zuniga, Heather Zuniga 10/27/2015 10:15 Date of Service: AM Medical Record [id number] Number: Patient Account Number: [id number] 1922-11-23 (82 y.o. Treating RN: Phillis Haggis Date of Birth/Sex: Female) Other Clinician: Primary Care Physician: Bjorn Pippin Treating Gawain Crombie Referring Physician: Bjorn Pippin Physician/Extender: Tania Ade in Treatment: 2 Subjective Chief Complaint Information obtained from Patient This 82 year old patient  has had bilateral lower extremity swelling for over 3 years and now has had an open ulceration on the posterior part of the left leg for several months and a recurrent ulceration on the right lower extremity which she's had for about a week History of Present Illness (HPI) The following HPI elements were documented for the patient's wound: Location: ulcerated area on the left and right posterior leg. She also has bilateral lower extremity swelling Quality: Patient reports experiencing a dull pain to affected area(s). Severity: Patient states wound are getting worse. Duration: Patient has had the wound for > 3 months prior to seeking treatment at the wound center Timing: Pain in wound is Intermittent (comes and goes Context: The wound appeared gradually over time Modifying Factors: Other treatment(s) tried include: she has had doxycycline and a compression ace wrap applied Associated Signs and Symptoms: Patient reports having increase swelling. She  has been seen by others in November of last year and was last seen on December 5 when she was lost to follow-up. I understand she did have a vascular workup done but we do not have these reports. She was prescribed lymphedema pumps which have not been in use as they do not have the personnel to put these on at the nursing home. 82 year old patient sent by her PCP Venora Maples for a stasis ulcer on her left lower leg which she's had for many months. she was also noticed to have recently, leg swelling and oozing. A past medical history significant for GERD, chronic kidney disease stage III,anemia, arthritis, pyuria, edema and depression. She has never been a smoker. 10/20/2015 -- was seen by Dr. Gilda Crease on 07/28/2015. ABIs were difficult to obtain but toe tracings were normal and TBI was normal. Right ABI was 0.99 and left TBI was 0.93. Duplex ultrasound of the lower extremity showed normal deep venous system superficial reflux was not present. No  surgery or intervention was recommended and was asked to continue to wear compression stockings 20-30 mm variety and a lymph pump was to be advised. Heather Zuniga, Heather Zuniga ([id number]) Objective Constitutional Pulse regular. Respirations normal and unlabored. Afebrile. Vitals Time Taken: 11:20 AM, Height: 61 in, Weight: 210 lbs, BMI: 39.7, Pulse: 68 bpm, Respiratory Rate: 18 breaths/min, Blood Pressure: 153/98 mmHg. Eyes Nonicteric. Reactive to light. Ears, Nose, Mouth, and Throat Lips, teeth, and gums WNL.Marland Kitchen Moist mucosa without lesions. Neck supple and nontender. No palpable supraclavicular or cervical adenopathy. Normal sized without goiter. Respiratory WNL. No retractions.. Cardiovascular Pedal Pulses WNL. No clubbing, cyanosis or edema. Lymphatic No adneopathy. No adenopathy. No adenopathy. Musculoskeletal Adexa without tenderness or enlargement.. Digits and nails w/o clubbing, cyanosis, infection, petechiae, ischemia, or inflammatory conditions.Marland Kitchen Psychiatric Judgement and insight Intact.. No evidence of depression, anxiety, or agitation.. General Notes: the lymphedema continues to be significant and I believe her dressings were not changed appropriately so the right leg was more moist than the left. I have checked the Doppler pulses both lower extremities and she's got a brisk signal which is triphasic in nature. Integumentary (Hair, Skin) No suspicious lesions. No crepitus or fluctuance. No peri-wound warmth or erythema. No masses.. Wound #4 status is Open. Original cause of wound was Gradually Appeared. The wound is located on the Right,Circumferential Lower Leg. The wound measures 12cm length x 29cm width x 0.1cm depth; 273.319cm^2 area and 27.332cm^3 volume. The wound is limited to skin breakdown. The wound margin is flat and intact. There is medium (34-66%) red, pink granulation within the wound bed. There is a medium (34-66%) amount of necrotic tissue within the wound bed including  Adherent Slough. The periwound skin Heather Zuniga, Heather Zuniga ([id number]) appearance exhibited: Localized Edema, Maceration, Moist. Wound #5 status is Open. Original cause of wound was Gradually Appeared. The wound is located on the Left,Circumferential Lower Leg. The wound measures 13cm length x 24cm width x 0.1cm depth; 245.044cm^2 area and 24.504cm^3 volume. The wound is limited to skin breakdown. There is a large amount of serous drainage noted. The wound margin is flat and intact. There is medium (34-66%) red, pink granulation within the wound bed. There is a medium (34-66%) amount of necrotic tissue within the wound bed including Adherent Slough. The periwound skin appearance exhibited: Maceration, Moist. The periwound skin appearance did not exhibit: Callus, Crepitus, Excoriation, Fluctuance, Friable, Induration, Localized Edema, Rash, Scarring, Dry/Scaly, Atrophie Blanche, Cyanosis, Ecchymosis, Hemosiderin Staining, Mottled, Pallor, Rubor, Erythema. Assessment Active Problems ICD-10 I89.0 - Lymphedema,  not elsewhere classified L97.221 - Non-pressure chronic ulcer of left calf limited to breakdown of skin L97.211 - Non-pressure chronic ulcer of right calf limited to breakdown of skin E66.01 - Morbid (severe) obesity due to excess calories N18.3 - Chronic kidney disease, stage 3 (moderate) Plan Wound Cleansing: Wound #4 Right,Circumferential Lower Leg: Clean wound with Normal Saline. Cleanse wound with mild soap and water Anesthetic: Wound #4 Right,Circumferential Lower Leg: Topical Lidocaine 4% cream applied to wound bed prior to debridement Skin Barriers/Peri-Wound Care: Wound #4 Right,Circumferential Lower Leg: Barrier cream Moisturizing lotion - to heels and feet (not between toes) Primary Wound Dressing: Wound #4 Right,Circumferential Lower Leg: Aquacel Ag Wound #5 Left,Circumferential Lower Leg: Aquacel Ag Secondary Dressing: TEAGYN, FISHEL ([id number]) Wound #4  Right,Circumferential Lower Leg: ABD pad XtraSorb Wound #5 Left,Circumferential Lower Leg: ABD pad XtraSorb Dressing Change Frequency: Wound #4 Right,Circumferential Lower Leg: Change Dressing Monday, Wednesday, Friday - when you change the dressing please wash pts leg with warm soapy water and dry well Wound #5 Left,Circumferential Lower Leg: Change Dressing Monday, Wednesday, Friday - when you change the dressing please wash pts leg with warm soapy water and dry well Follow-up Appointments: Wound #4 Right,Circumferential Lower Leg: Return Appointment in 1 week. Wound #5 Left,Circumferential Lower Leg: Return Appointment in 1 week. Edema Control: Wound #4 Right,Circumferential Lower Leg: 4 Layer Compression System - Bilateral - anchor with unna Elevate legs to the level of the heart and pump ankles as often as possible Wound #5 Left,Circumferential Lower Leg: 4 Layer Compression System - Bilateral - anchor with unna Elevate legs to the level of the heart and pump ankles as often as possible Additional Orders / Instructions: Wound #4 Right,Circumferential Lower Leg: Increase protein intake. Activity as tolerated Other: - ok to exercise Wound #5 Left,Circumferential Lower Leg: Increase protein intake. Activity as tolerated Other: - ok to exercise Home Health: Wound #4 Right,Circumferential Lower Leg: Continue Home Health Visits - [facility] *****Please go wrap legs and do dressing changes on Wednesdays and Fridays and pt will be seen in clinic on Mondays***** Home Health Nurse may visit PRN to address patient s wound care needs. FACE TO FACE ENCOUNTER: MEDICARE and MEDICAID PATIENTS: I certify that this patient is under my care and that I had a face-to-face encounter that meets the physician face-to-face encounter requirements with this patient on this date. The encounter with the patient was in whole or in part for the following MEDICAL CONDITION: (primary reason for Home  Healthcare) MEDICAL NECESSITY: I certify, that based on my findings, NURSING services are a medically necessary home health service. HOME BOUND STATUS: I certify that my clinical findings support that this patient is homebound (i.e., Due to illness or injury, pt requires aid of supportive devices such as crutches, cane, wheelchairs, walkers, the use of special transportation or the assistance of another person to leave their place of residence. There is a normal inability to leave the home and doing so requires considerable and taxing effort. Other absences are for medical reasons / religious services and are infrequent or of short duration when for other reasons). If current dressing causes regression in wound condition, may D/C ordered dressing product/s and apply Normal Saline Moist Dressing daily until next Wound Healing Center / Other MD appointment. Notify Wound ALWILDA, [facility] ([id number]) Healing Center of regression in wound condition at [phone]. Please direct any NON-WOUND related issues/requests for orders to patient's Primary Care Physician I have recommended elevation and exercise and the patient is rather reluctant to do  this. In the meanwhile I have recommended we will use Aquacel Ag and a 4 layer Profore wrap. The dressing changes are done 3 times a week, with two changes being done by the nursing home staff on Wednesday and Friday She is to come back and see [place] every week and her caregiver says she will be compliant Electronic Signature(s) Signed: 10/27/2015 11:34:50 AM By: Evlyn Kanner MD, FACS Entered By: Evlyn Kanner on 10/27/2015 11:34:49 [name] ([id number]) -------------------------------------------------------------------------------- SuperBill Details Patient Name: [name] Date of Service: 10/27/2015 Medical Record Number: [id number] Patient Account Number: [id number] Date of Birth/Sex: 01/05/1923 (82 y.o. 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 Physician Procedures CPT4 Code Description: [id number] - WC PHYS LEVEL 3 - EST PT ICD-10 Description Diagnosis I89.0 Lymphedema, not elsewhere classified L97.221 Non-pressure chronic ulcer of left calf limited to L97.211 Non-pressure chronic ulcer of right calf limited to  E66.01 Morbid (severe) obesity due to excess calories Modifier: breakdown of sk breakdown of s Quantity: 1 in kin Electronic Signature(s) Signed: 10/27/2015 4:23:25 PM By: Evlyn Kanner MD, FACS Signed: 10/27/2015 4:58:45 PM By: Alejandro Mulling Previous Signature: 10/27/2015 11:35:09 AM Version By: Evlyn Kanner MD, FACS Entered By: Alejandro Mulling on 10/27/2015 16:07:41

## 2015-10-27 NOTE — ED Notes (Signed)
Pt has been taken to Xray   

## 2015-10-27 NOTE — ED Provider Notes (Signed)
[facility] Emergency Department Provider Note  ____________________________________________  Time seen: Approximately 4:11 PM  I have reviewed the triage vital signs and the nursing notes.   HISTORY  Chief Complaint Altered Mental Status and Cellulitis    HPI Heather Zuniga is a 82 y.o. female with advanced dementia coming from [facility] for altered mental status. The patient is demented and unable to give me any history.  Per report, the patient has chronic nonhealing lower extremity once. Her paperwork states that she has been having nonhealing wounds in the left lower extremity for months, and 1 week of nonhealing pressure ulcers in the right lower extremity on the posterior aspect. She was evaluated at the wound care clinic today, and her wounds were felt to be worse. There is also report that she has had 3 weeks of worsening mental status.  I have put a call in to the nursing home to speak with her care nurse and her GI obtained more information.  Past Medical History  Diagnosis Date  . GERD (gastroesophageal reflux disease)   . Anemia   . Arthritis   . Pyuria   . Edema   . Depression     Patient Active Problem List   Diagnosis Date Noted  . GERD (gastroesophageal reflux disease) 06/09/2015  . Obesity 06/09/2015  . Arthritis 06/09/2015  . Edema 06/09/2015  . Stasis leg ulcer (HCC) 06/09/2015  . Absolute anemia 03/23/2014  . Chronic kidney disease (CKD), stage III (moderate) 03/23/2014  . Arthritis, degenerative 03/23/2014    History reviewed. No pertinent past surgical history.  Current Outpatient Rx  Name  Route  Sig  Dispense  Refill  . acetaminophen (TYLENOL) 650 MG CR tablet   Oral   Take 1,300 mg by mouth 2 (two) times daily.         Marland Kitchen albuterol (PROVENTIL HFA;VENTOLIN HFA) 108 (90 Base) MCG/ACT inhaler   Inhalation   Inhale 2 puffs into the lungs every 4 (four) hours as needed for wheezing or shortness of  breath.         . ferrous sulfate 325 (65 FE) MG tablet   Oral   Take 325 mg by mouth daily.         Marland Kitchen latanoprost (XALATAN) 0.005 % ophthalmic solution   Both Eyes   Place 1 drop into both eyes at bedtime.          Marland Kitchen levothyroxine (SYNTHROID, LEVOTHROID) 25 MCG tablet   Oral   Take 25 mcg by mouth daily before breakfast.         . Multiple Vitamin (MULTIVITAMIN WITH MINERALS) TABS tablet   Oral   Take 1 tablet by mouth daily.         Marland Kitchen omeprazole (PRILOSEC) 20 MG capsule   Oral   Take 20 mg by mouth daily.          Bertram Gala Glycol-Propyl Glycol (SYSTANE ULTRA) 0.4-0.3 % SOLN   Both Eyes   Place 1 drop into both eyes 2 (two) times daily.         . potassium chloride (K-DUR,KLOR-CON) 10 MEQ tablet   Oral   Take 10 mEq by mouth daily.          Marland Kitchen spironolactone (ALDACTONE) 25 MG tablet   Oral   Take 25 mg by mouth daily.          . timolol (TIMOPTIC) 0.5 % ophthalmic solution   Left Eye   Place 1 drop  into the left eye daily.         Marland Kitchen torsemide (DEMADEX) 20 MG tablet   Oral   Take 20 mg by mouth daily.            Allergies Review of patient's allergies indicates no known allergies.  History reviewed. No pertinent family history.  Social History Social History  Substance Use Topics  . Smoking status: Never Smoker   . Smokeless tobacco: Never Used  . Alcohol Use: No    Review of Systems Unable to obtain due to patient dementia.  ____________________________________________   PHYSICAL EXAM:  VITAL SIGNS: ED Triage Vitals  Enc Vitals Group     BP 10/27/15 1547 119/54 mmHg     Pulse Rate 10/27/15 1547 74     Resp 10/27/15 1547 17     Temp 10/27/15 1547 94 F (34.4 C)     Temp Source 10/27/15 1547 Oral     SpO2 --      Weight 10/27/15 1547 223 lb (101.152 kg)     Height 10/27/15 1547 5\' 5"  (1.651 m)     Head Cir --      Peak Flow --      Pain Score --      Pain Loc --      Pain Edu? --      Excl. in GC? --      Constitutional: Patient is alert and answering questions but is not oriented. She is chronically ill appearing but in no acute distress.  Eyes: Conjunctivae are normal.  EOMI. no scleral icterus. No eye discharge. Head: Atraumatic. Nose: No congestion/rhinnorhea. Mouth/Throat: Mucous membranes are moist.  Neck: No stridor.  Supple.  No JVD. Cardiovascular: Normal rate, regular rhythm. No murmurs, rubs or gallops.  Respiratory: Normal respiratory effort.  No retractions. Lungs CTAB.  No wheezes, rales or ronchi. Gastrointestinal: Obese. Soft and nontender. No distention. No peritoneal signs. GU: Patient has black stool that is guaiac negative. Musculoskeletal: One care dressings were removed. Bilateral lower extremity edema that is symmetric. On the left posterior lower leg, the patient has multiple superficial open wounds that have yellow discharge without significant surrounding swelling or erythema. On the right posterior lower leg the patient has smaller open wounds with a similar appearance. There is no significant malodor. The patient has refill of 2-3 seconds in the feet. 5 out of 5 strength in plantar flexion and dorsiflexion. Neurologic:  Normal speech and language. No gross focal neurologic deficits are appreciated.  Skin:  Skin is warm, dry and intact. No rash noted. Psychiatric: Mood and affect are normal.   ____________________________________________   LABS (all labs ordered are listed, but only abnormal results are displayed)  Labs Reviewed  CBC - Abnormal; Notable for the following:    RBC 3.02 (*)    Hemoglobin 10.2 (*)    HCT 31.0 (*)    MCV 102.6 (*)    All other components within normal limits  COMPREHENSIVE METABOLIC PANEL - Abnormal; Notable for the following:    BUN 51 (*)    Creatinine, Ser 1.57 (*)    Calcium 8.6 (*)    AST 42 (*)    Alkaline Phosphatase 143 (*)    GFR calc non Af Amer 28 (*)    GFR calc Af Amer 32 (*)    All other components within  normal limits  SEDIMENTATION RATE - Abnormal; Notable for the following:    Sed Rate 107 (*)    All other  components within normal limits  CULTURE, BLOOD (ROUTINE X 2)  CULTURE, BLOOD (ROUTINE X 2)  URINE CULTURE  C-REACTIVE PROTEIN  URINALYSIS COMPLETEWITH MICROSCOPIC ([facility] ONLY)   ____________________________________________  EKG  ED ECG REPORT I, Rockne Menghini, the attending physician, personally viewed and interpreted this ECG.   Date: 10/27/2015  EKG Time: 1747  Rate: 75  Rhythm: normal sinus rhythm  Axis: Normal  Intervals:none  ST&T Change: Poor baseline tracing but no evidence of abnormal ST segments.  ____________________________________________  RADIOLOGY  Dg Tibia/fibula Left  10/27/2015  CLINICAL DATA:  Multiple bilateral nonhealing wounds of the lower legs. Cellulitis. EXAM: LEFT TIBIA AND FIBULA - 2 VIEW COMPARISON:  None. FINDINGS: There is no fracture or bone destruction. Slight tricompartmental osteoarthritis of the left knee. Diffuse subcutaneous edema with phleboliths in the soft tissues of the lower leg. IMPRESSION: Soft tissue edema. No acute osseous abnormality. Specifically, no evidence of osteomyelitis. Electronically Signed   By: Francene Boyers M.D.   On: 10/27/2015 16:57   Dg Tibia/fibula Right  10/27/2015  CLINICAL DATA:  Recurrent cellulitis of both legs. Multiple bilateral nonhealing wounds in the lower legs. Altered mental status for 3 weeks. EXAM: RIGHT TIBIA AND FIBULA - 2 VIEW COMPARISON:  Limited correlation made with right hip radiographs 01/27/2013. FINDINGS: The bones are demineralized. There is generalized edema throughout the right lower leg. No soft tissue emphysema or foreign body visualized. No evidence of acute fracture, dislocation or bone destruction. Chondrocalcinosis noted at the knee. IMPRESSION: No acute osseous findings or evidence of osteomyelitis. Generalized soft tissue edema consistent with the given history of  cellulitis. Electronically Signed   By: Carey Bullocks M.D.   On: 10/27/2015 16:58    ____________________________________________   PROCEDURES  Procedure(s) performed: None  Critical Care performed: No ____________________________________________   INITIAL IMPRESSION / ASSESSMENT AND PLAN / ED COURSE  Pertinent labs & imaging results that were available during my care of the patient were reviewed by me and considered in my medical decision making (see chart for details).  82 y.o. female with chronically nonhealing posterior lower extremity wounds and advanced dementia presenting for altered mental status. On her exam, the patient is hypothermic and repeat temperature that was done rectally confirms a temperature of 34.4. The remainder of her vitals are within normal limits. She does have findings of chronic leg wounds, and we will evaluate for infection. Consider osteomyelitis and bacteremia as well. She has some malodor which may be a sign of UTI as well.  7:24 PM Per pt's nurse at [facility], the pt has chronically nonhealing wounds.  "Over the last few weeks, her whole demeanor has changed.  She's sleeping all the time, she's drinking okay but eating well.  She's eating one cracker a day."  Reports "odd" behaviors, including visual hallucinations.  Complains about being cold.  Wound care told the nurse "the leg looks worse."  ----------------------------------------- 5:19 PM on 10/27/2015 -----------------------------------------  The patient's x-rays do not show any evidence of osteomyelitis. Her white blood cell count is within normal limits. I am awaiting the results of her sedimentation rate. Given her changes in mental status, as well as open leg wounds, I will cover her for cellulitis with vancomycin. She will require admission to the hospital.  ----------------------------------------- 7:23 PM on 10/27/2015 -----------------------------------------  The patient  continues to be hypothermic at 94.6 despite Bair hugger. We will continue this and continue to monitor her. She does not have an elevated white blood cell count, but her  sedimentation rate is elevated. I have treated her with vancomycin and she is ready for admission. ____________________________________________  FINAL CLINICAL IMPRESSION(S) / ED DIAGNOSES  Final diagnoses:  Altered mental status, unspecified altered mental status type  Somnolence, daytime  Multiple open wounds of lower leg, unspecified laterality, initial encounter  Hypothermia, initial encounter      NEW MEDICATIONS STARTED DURING THIS VISIT:  New Prescriptions   No medications on file     Rockne Menghini, MD 10/27/15 1925

## 2015-10-27 NOTE — H&P (Signed)
[facility] [place] [facility]   PATIENT NAME: Heather Zuniga    MR#:  [id number]  DATE OF BIRTH:  1922/10/15  DATE OF ADMISSION:  10/27/2015  PRIMARY CARE PHYSICIAN: Filbert Berthold, NP   REQUESTING/REFERRING PHYSICIAN: Dr. Sharma Covert  CHIEF COMPLAINT:   Chief Complaint  Patient presents with  . Altered Mental Status  . Cellulitis    HISTORY OF PRESENT ILLNESS:  Heather Zuniga  is a 82 y.o. female with a known history of CKD stage III, obesity a resident of local assisted living facility who walks with a walker at baseline presents to the emergency room sent in from wound care center due to worsening right lower extremity wounds. Patient has had chronic lower extremity wounds. Today in the emergency room she has been found to be hypothermic at 94. Afebrile with normal white count. Patient has been confused with hallucinations over the last 3 weeks as per daughter at bedside. Poor appetite and worsening generalized weakness.   PAST MEDICAL HISTORY:   Past Medical History  Diagnosis Date  . GERD (gastroesophageal reflux disease)   . Anemia   . Arthritis   . Pyuria   . Edema   . Depression   . CKD (chronic kidney disease) stage 3, GFR 30-59 ml/min     PAST SURGICAL HISTORY:  History reviewed. No pertinent past surgical history.  SOCIAL HISTORY:   Social History  Substance Use Topics  . Smoking status: Never Smoker   . Smokeless tobacco: Never Used  . Alcohol Use: No    FAMILY HISTORY:   Family History  Problem Relation Age of Onset  . Rheum arthritis Other     DRUG ALLERGIES:  No Known Allergies  REVIEW OF SYSTEMS:   Review of Systems  Unable to perform ROS: mental status change    MEDICATIONS AT HOME:   Prior to Admission medications   Medication Sig Start Date End Date Taking? Authorizing Provider  acetaminophen (TYLENOL) 650 MG CR tablet Take 1,300 mg by mouth 2 (two) times daily.   Yes Historical Provider, MD  albuterol  (PROVENTIL HFA;VENTOLIN HFA) 108 (90 Base) MCG/ACT inhaler Inhale 2 puffs into the lungs every 4 (four) hours as needed for wheezing or shortness of breath.   Yes Historical Provider, MD  ferrous sulfate 325 (65 FE) MG tablet Take 325 mg by mouth daily.   Yes Historical Provider, MD  latanoprost (XALATAN) 0.005 % ophthalmic solution Place 1 drop into both eyes at bedtime.    Yes Historical Provider, MD  levothyroxine (SYNTHROID, LEVOTHROID) 25 MCG tablet Take 25 mcg by mouth daily before breakfast.   Yes Historical Provider, MD  Multiple Vitamin (MULTIVITAMIN WITH MINERALS) TABS tablet Take 1 tablet by mouth daily.   Yes Historical Provider, MD  omeprazole (PRILOSEC) 20 MG capsule Take 20 mg by mouth daily.    Yes Historical Provider, MD  Polyethyl Glycol-Propyl Glycol (SYSTANE ULTRA) 0.4-0.3 % SOLN Place 1 drop into both eyes 2 (two) times daily.   Yes Historical Provider, MD  potassium chloride (K-DUR,KLOR-CON) 10 MEQ tablet Take 10 mEq by mouth daily.    Yes Historical Provider, MD  spironolactone (ALDACTONE) 25 MG tablet Take 25 mg by mouth daily.    Yes Historical Provider, MD  timolol (TIMOPTIC) 0.5 % ophthalmic solution Place 1 drop into the left eye daily.   Yes Historical Provider, MD  torsemide (DEMADEX) 20 MG tablet Take 20 mg by mouth daily.    Yes Historical Provider, MD  VITAL SIGNS:  Blood pressure 145/80, pulse 85, temperature 97.4 F (36.3 C), temperature source Oral, resp. rate 15, height 5\' 5"  (1.651 m), weight 101.152 kg (223 lb), SpO2 99 %.  PHYSICAL EXAMINATION:  Physical Exam  GENERAL:  82 y.o.-year-old patient lying in the bed with no acute distress. Morbidly obese EYES: Pupils equal, round, reactive to light and accommodation. No scleral icterus. Extraocular muscles intact.  HEENT: Head atraumatic, normocephalic. Oropharynx and nasopharynx clear. No oropharyngeal erythema, moist oral mucosa  NECK:  Supple, no jugular venous distention. No thyroid enlargement, no  tenderness.  LUNGS: Normal breath sounds bilaterally, no wheezing, rales, rhonchi. No use of accessory muscles of respiration.  CARDIOVASCULAR: S1, S2 normal. No murmurs, rubs, or gallops.  ABDOMEN: Soft, nontender, nondistended. Bowel sounds present. No organomegaly or mass.  EXTREMITIES: No pedal edema, cyanosis, or clubbing. + 2 pedal & radial pulses b/l.   NEUROLOGIC: Motor strength 5 over 5 in upper extremity's. 4/5 in lower extremity's. Sensations intact all over. cranial nerves II-12 intact PSYCHIATRIC: The patient is alert and awake. Oriented to person but not place or time SKIN: Bilateral lower extremity superficial skin ulcers with surrounding erythema  LABORATORY PANEL:   CBC  Recent Labs Lab 10/27/15 1630  WBC 6.7  HGB 10.2*  HCT 31.0*  PLT 226   ------------------------------------------------------------------------------------------------------------------  Chemistries   Recent Labs Lab 10/27/15 1630  NA 141  K 4.3  CL 110  CO2 25  GLUCOSE 84  BUN 51*  CREATININE 1.57*  CALCIUM 8.6*  AST 42*  ALT 27  ALKPHOS 143*  BILITOT 0.5   ------------------------------------------------------------------------------------------------------------------  Cardiac Enzymes No results for input(s): TROPONINI in the last 168 hours. ------------------------------------------------------------------------------------------------------------------  RADIOLOGY:  Dg Tibia/fibula Left  10/27/2015  CLINICAL DATA:  Multiple bilateral nonhealing wounds of the lower legs. Cellulitis. EXAM: LEFT TIBIA AND FIBULA - 2 VIEW COMPARISON:  None. FINDINGS: There is no fracture or bone destruction. Slight tricompartmental osteoarthritis of the left knee. Diffuse subcutaneous edema with phleboliths in the soft tissues of the lower leg. IMPRESSION: Soft tissue edema. No acute osseous abnormality. Specifically, no evidence of osteomyelitis. Electronically Signed   By: Francene Boyers M.D.   On:  10/27/2015 16:57   Dg Tibia/fibula Right  10/27/2015  CLINICAL DATA:  Recurrent cellulitis of both legs. Multiple bilateral nonhealing wounds in the lower legs. Altered mental status for 3 weeks. EXAM: RIGHT TIBIA AND FIBULA - 2 VIEW COMPARISON:  Limited correlation made with right hip radiographs 01/27/2013. FINDINGS: The bones are demineralized. There is generalized edema throughout the right lower leg. No soft tissue emphysema or foreign body visualized. No evidence of acute fracture, dislocation or bone destruction. Chondrocalcinosis noted at the knee. IMPRESSION: No acute osseous findings or evidence of osteomyelitis. Generalized soft tissue edema consistent with the given history of cellulitis. Electronically Signed   By: Carey Bullocks M.D.   On: 10/27/2015 16:58     IMPRESSION AND PLAN:   * UTI with acute encephalopathy Start IV ceftriaxone. Urine cultures and blood cultures have been sent. Patient might have some early Alzheimer's. Hypothermia with [place].  * Bilateral lower extremity chronic ulcers due to chronic edema Patient follows at the wound care center.  X-rays showed no osteomyelitis or gas under the skin. Start IV vancomycin.  Consult wound care nurse. No history of MRSA  * CKD stage III is stable  * Weakness Consult physical therapy. Patient lives at assisted living. Tablets with a walker at baseline.  * DVT prophylaxis with Lovenox  All the records are reviewed and case discussed with ED provider. Management plans discussed with the patient, family and they are in agreement.  CODE STATUS: FULL CODE  TOTAL TIME TAKING CARE OF THIS PATIENT: 40 minutes.   Milagros Loll R M.D on 10/27/2015 at 7:52 PM  Between 7am to 6pm - Pager - [phone]  After 6pm go to [email or web address] - password [email or web address] [facility]  [facility]  Office  [phone]  CC: Primary care physician; Amy L Krebs, NP  Note: This dictation was prepared with Dragon dictation  along with smaller phrase technology. Any transcriptional errors that result from this process are unintentional.

## 2015-10-27 NOTE — ED Notes (Signed)
Admitting MD at bedside.

## 2015-10-27 NOTE — ED Notes (Signed)
Pt arrived to ED from [facility] for altered mental status x 3 weeks that has become worse today. Pt was seen by wound care MD for recurrent cellulitis of both legs. Wounds re wrapped today but MD reports wounds have become worse and [facility] was requesting pt be screened for sepsis. Pt is oriented to place but disoriented to self, time or situation. Pt is reported to have been cussing at people that are not in the room.

## 2015-10-28 ENCOUNTER — Inpatient Hospital Stay: Payer: Medicare Other

## 2015-10-28 LAB — CBC
HEMATOCRIT: 31.7 % — AB (ref 35.0–47.0)
Hemoglobin: 10.6 g/dL — ABNORMAL LOW (ref 12.0–16.0)
MCH: 34.4 pg — AB (ref 26.0–34.0)
MCHC: 33.6 g/dL (ref 32.0–36.0)
MCV: 102.5 fL — ABNORMAL HIGH (ref 80.0–100.0)
Platelets: 209 10*3/uL (ref 150–440)
RBC: 3.09 MIL/uL — ABNORMAL LOW (ref 3.80–5.20)
RDW: 14.1 % (ref 11.5–14.5)
WBC: 4.6 10*3/uL (ref 3.6–11.0)

## 2015-10-28 LAB — BASIC METABOLIC PANEL
Anion gap: 3 — ABNORMAL LOW (ref 5–15)
BUN: 40 mg/dL — AB (ref 6–20)
CALCIUM: 8 mg/dL — AB (ref 8.9–10.3)
CO2: 26 mmol/L (ref 22–32)
Chloride: 110 mmol/L (ref 101–111)
Creatinine, Ser: 1.48 mg/dL — ABNORMAL HIGH (ref 0.44–1.00)
GFR calc Af Amer: 35 mL/min — ABNORMAL LOW (ref >60)
GFR, EST NON AFRICAN AMERICAN: 30 mL/min — AB (ref >60)
GLUCOSE: 60 mg/dL — AB (ref 65–99)
Potassium: 4.9 mmol/L (ref 3.5–5.1)
Sodium: 139 mmol/L (ref 135–145)

## 2015-10-28 LAB — BLOOD GAS, ARTERIAL
ACID-BASE EXCESS: 3.7 mmol/L — AB (ref 0.0–3.0)
BICARBONATE: 28.5 meq/L — AB (ref 21.0–28.0)
FIO2: 0.21
O2 SAT: 95.3 %
PCO2 ART: 43 mmHg (ref 32.0–48.0)
Patient temperature: 37
pH, Arterial: 7.43 (ref 7.350–7.450)
pO2, Arterial: 75 mmHg — ABNORMAL LOW (ref 83.0–108.0)

## 2015-10-28 LAB — MRSA PCR SCREENING: MRSA BY PCR: NEGATIVE

## 2015-10-28 LAB — C-REACTIVE PROTEIN: CRP: 5.6 mg/dL — ABNORMAL HIGH (ref <1.0)

## 2015-10-28 NOTE — Progress Notes (Signed)
BRIE, GOWELL ([id number]) Visit Report for 10/27/2015 Arrival Information Details Patient Name: Heather Zuniga Date of Service: 10/27/2015 10:15 AM Medical Record Number: [id number] Patient Account Number: [id number] Date of Birth/Sex: 1922/11/26 (82 y.o. Female) Treating RN: Leonard Downing Primary Care Physician: Bjorn Pippin Other Clinician: Referring Physician: Bjorn Pippin Treating Physician/Extender: Rudene Re in Treatment: 2 Visit Information History Since Last Visit All ordered tests and consults were completed: No Patient Arrived: Wheel Chair Added or deleted any medications: No Arrival Time: 11:03 Any new allergies or adverse reactions: No Accompanied By: caregiver Had a fall or experienced change in No activities of daily living that may affect Transfer Assistance: None risk of falls: Patient Identification Verified: Yes Signs or symptoms of abuse/neglect since last No Secondary Verification Process Yes visito Completed: Hospitalized since last visit: No Patient Requires Transmission-Based No Has Dressing in Place as Prescribed: Yes Precautions: Has Compression in Place as Prescribed: Yes Patient Has Alerts: No Pain Present Now: No Electronic Signature(s) Signed: 10/27/2015 4:16:25 PM By: Lucrezia Starch RN, Sendra Entered By: Lucrezia Starch RN, Sendra on 10/27/2015 11:04:14 Heather Zuniga ([id number]) -------------------------------------------------------------------------------- Encounter Discharge Information Details Patient Name: Heather Zuniga Date of Service: 10/27/2015 10:15 AM Medical Record Number: [id number] Patient Account Number: [id number] Date of Birth/Sex: [birth year] (82 y.o. Female) Treating RN: Phillis Haggis Primary Care Physician: Bjorn Pippin Other Clinician: Referring Physician: Bjorn Pippin Treating Physician/Extender: Rudene Re in Treatment: 2 Encounter Discharge Information Items Discharge Pain Level: 0 Discharge Condition:  Stable Ambulatory Status: Wheelchair Discharge Destination: Nursing Home Transportation: Other Accompanied By: caregiver Schedule Follow-up Appointment: Yes Medication Reconciliation completed and provided to Patient/Care Yes Faviola Klare: Provided on Clinical Summary of Care: 10/27/2015 Form Type Recipient Paper Patient HB Electronic Signature(s) Signed: 10/27/2015 4:58:45 PM By: Alejandro Mulling Previous Signature: 10/27/2015 11:48:57 AM Version By: Gwenlyn Perking Entered By: Alejandro Mulling on 10/27/2015 11:49:40 Heather Zuniga ([id number]) -------------------------------------------------------------------------------- Lower Extremity Assessment Details Patient Name: Heather Zuniga Date of Service: 10/27/2015 10:15 AM Medical Record Number: [id number] Patient Account Number: [id number] Date of Birth/Sex: 01/13/1923 (82 y.o. Female) Treating RN: Leonard Downing Primary Care Physician: Bjorn Pippin Other Clinician: Referring Physician: Bjorn Pippin Treating Physician/Extender: Rudene Re in Treatment: 2 Edema Assessment Assessed: [Left: No] [Right: No] Edema: [Left: Yes] [Right: Yes] Calf Left: Right: Point of Measurement: 31 cm From Medial Instep 48.5 cm 47 cm Ankle Left: Right: Point of Measurement: 10 cm From Medial Instep 27 cm 32.3 cm Vascular Assessment Pulses: Posterior Tibial Dorsalis Pedis Palpable: [Left:Yes] [Right:Yes] Extremity colors, hair growth, and conditions: Extremity Color: [Left:Mottled] [Right:Mottled] Hair Growth on Extremity: [Left:No] [Right:No] Temperature of Extremity: [Left:Cool] [Right:Cold] Capillary Refill: [Left:< 3 seconds] [Right:< 3 seconds] Toe Nail Assessment Left: Right: Thick: Yes Yes Discolored: No No Deformed: No No Improper Length and Hygiene: Yes Yes Electronic Signature(s) Signed: 10/27/2015 4:16:25 PM By: Lucrezia Starch, RN, Sendra Entered By: Lucrezia Starch RN, Sendra on 10/27/2015 11:07:37 Heather Zuniga  ([id number]) -------------------------------------------------------------------------------- Multi Wound Chart Details Patient Name: Heather Zuniga Date of Service: 10/27/2015 10:15 AM Medical Record Number: [id number] Patient Account Number: [id number] Date of Birth/Sex: May 22, 1923 (82 y.o. Female) Treating RN: Phillis Haggis Primary Care Physician: Bjorn Pippin Other Clinician: Referring Physician: Bjorn Pippin Treating Physician/Extender: Rudene Re in Treatment: 2 Vital Signs Height(in): 61 Pulse(bpm): 68 Weight(lbs): 210 Blood Pressure 153/98 (mmHg): Body Mass Index(BMI): 40 Temperature(F): Respiratory Rate 18 (breaths/min): Photos: [4:No Photos] [5:No Photos] [N/A:N/A] Wound Location: [4:Right Lower Leg - Circumfernential] [5:Left Lower Leg - Circumfernential] [N/A:N/A] Wounding Event: [4:Gradually Appeared] [5:Gradually Appeared] [N/A:N/A] Primary Etiology: [4:Lymphedema] [5:Lymphedema] [N/A:N/A] Secondary  Etiology: [4:Venous Leg Ulcer] [5:N/A] [N/A:N/A] Comorbid History: [4:Cataracts, Glaucoma, Anemia, Congestive Heart Anemia, Congestive Heart Failure, Osteoarthritis Failure, Osteoarthritis] [5:Cataracts, Glaucoma,] [N/A:N/A] Date Acquired: [4:09/29/2015] [5:10/19/2015] [N/A:N/A] Weeks of Treatment: [4:2] [5:1] [N/A:N/A] Wound Status: [4:Open] [5:Open] [N/A:N/A] Measurements L x W x D 12x29x0.1 [5:13x24x0.1] [N/A:N/A] (cm) Area (cm) : [4:273.319] [5:245.044] [N/A:N/A] Volume (cm) : [4:27.332] [5:24.504] [N/A:N/A] % Reduction in Area: [4:52.50%] [5:-1.30%] [N/A:N/A] % Reduction in Volume: 52.50% [5:-1.30%] [N/A:N/A] Classification: [4:Partial Thickness] [5:Partial Thickness] [N/A:N/A] Exudate Amount: [4:N/A] [5:Large] [N/A:N/A] Exudate Type: [4:N/A] [5:Serous] [N/A:N/A] Exudate Color: [4:N/A] [5:amber] [N/A:N/A] Wound Margin: [4:Flat and Intact] [5:Flat and Intact] [N/A:N/A] Granulation Amount: [4:Medium (34-66%)] [5:Medium (34-66%)] [N/A:N/A] Granulation  Quality: [4:Red, Pink] [5:Red, Pink] [N/A:N/A] Necrotic Amount: [4:Medium (34-66%)] [5:Medium (34-66%)] [N/A:N/A] Exposed Structures: [4:Fascia: No Fat: No Tendon: No Muscle: No] [5:Fascia: No Fat: No Tendon: No Muscle: No] [N/A:N/A] Joint: No Joint: No Bone: No Bone: No Limited to Skin Limited to Skin Breakdown Breakdown Epithelialization: None None N/A Periwound Skin Texture: Edema: Yes Edema: No N/A Excoriation: No Induration: No Callus: No Crepitus: No Fluctuance: No Friable: No Rash: No Scarring: No Periwound Skin Maceration: Yes Maceration: Yes N/A Moisture: Moist: Yes Moist: Yes Dry/Scaly: No Periwound Skin Color: No Abnormalities Noted Atrophie Blanche: No N/A Cyanosis: No Ecchymosis: No Erythema: No Hemosiderin Staining: No Mottled: No Pallor: No Rubor: No Tenderness on No No N/A Palpation: Wound Preparation: Ulcer Cleansing: Other: Ulcer Cleansing: Other: N/A soap and water soap and water Topical Anesthetic Applied: None Treatment Notes Electronic Signature(s) Signed: 10/27/2015 4:58:45 PM By: Alejandro Mulling Entered By: Alejandro Mulling on 10/27/2015 11:28:15 Heather Zuniga ([id number]) -------------------------------------------------------------------------------- Multi-Disciplinary Care Plan Details Patient Name: Heather Zuniga Date of Service: 10/27/2015 10:15 AM Medical Record Number: [id number] Patient Account Number: [id number] Date of Birth/Sex: [birth year] (82 y.o. Female) Treating RN: Phillis Haggis Primary Care Physician: Bjorn Pippin Other Clinician: Referring Physician: Bjorn Pippin Treating Physician/Extender: Rudene Re in Treatment: 2 Active Inactive Abuse / Safety / Falls / Self Care Management Nursing Diagnoses: Potential for falls Goals: Patient will remain injury free Date Initiated: 10/13/2015 Goal Status: Active Interventions: Assess fall risk on admission and as needed Notes: Orientation to the Wound Care  Program Nursing Diagnoses: Knowledge deficit related to the wound healing center program Goals: Patient/caregiver will verbalize understanding of the Wound Healing Center Program Date Initiated: 10/13/2015 Goal Status: Active Interventions: Provide education on orientation to the wound center Notes: Pain, Acute or Chronic Nursing Diagnoses: Pain, acute or chronic: actual or potential Potential alteration in comfort, pain GoalsTEXAS, OBORN ([id number]) Patient will verbalize adequate pain control and receive pain control interventions during procedures as needed Date Initiated: 10/13/2015 Goal Status: Active Interventions: Assess comfort goal upon admission Complete pain assessment as per visit requirements Notes: Wound/Skin Impairment Nursing Diagnoses: Impaired tissue integrity Goals: Ulcer/skin breakdown will have a volume reduction of 30% by week 4 Date Initiated: 10/13/2015 Goal Status: Active Ulcer/skin breakdown will have a volume reduction of 50% by week 8 Date Initiated: 10/13/2015 Goal Status: Active Ulcer/skin breakdown will have a volume reduction of 80% by week 12 Date Initiated: 10/13/2015 Goal Status: Active Interventions: Assess patient/caregiver ability to obtain necessary supplies Assess ulceration(s) every visit Notes: Electronic Signature(s) Signed: 10/27/2015 4:58:45 PM By: Alejandro Mulling Entered By: Alejandro Mulling on 10/27/2015 11:23:28 Heather Zuniga ([id number]) -------------------------------------------------------------------------------- Pain Assessment Details Patient Name: Heather Zuniga Date of Service: 10/27/2015 10:15 AM Medical Record Number: [id number] Patient Account Number: [id number] Date of Birth/Sex: 11/20/1922 (82 y.o. Female) Treating RN: Leonard Downing Primary Care Physician: Bjorn Pippin  Other Clinician: Referring Physician: Bjorn Pippin Treating Physician/Extender: Rudene Re in Treatment: 2 Active Problems Location of  Pain Severity and Description of Pain Patient Has Paino No Site Locations Rate the pain. Current Pain Level: 0 Pain Management and Medication Current Pain Management: Electronic Signature(s) Signed: 10/27/2015 4:16:25 PM By: Lucrezia Starch, RN, Sendra Entered By: Lucrezia Starch RN, Sendra on 10/27/2015 11:04:22 SYEDA, PRICKETT ([id number]) -------------------------------------------------------------------------------- Patient/Caregiver Education Details Patient Name: Heather Zuniga Date of Service: 10/27/2015 10:15 AM Medical Record Number: [id number] Patient Account Number: [id number] Date of Birth/Gender: Jun 25, 1923 (82 y.o. Female) Treating RN: Ashok Cordia, Debi Primary Care Physician: Bjorn Pippin Other Clinician: Referring Physician: Bjorn Pippin Treating Physician/Extender: Rudene Re in Treatment: 2 Education Assessment Education Provided To: Caregiver Education Topics Provided Wound/Skin Impairment: Handouts: Other: do not get wraps wet Methods: Demonstration, Explain/Verbal Responses: State content correctly Electronic Signature(s) Signed: 10/27/2015 4:58:45 PM By: Alejandro Mulling Entered By: Alejandro Mulling on 10/27/2015 11:49:57 Heather Zuniga ([id number]) -------------------------------------------------------------------------------- Wound Assessment Details Patient Name: Heather Zuniga Date of Service: 10/27/2015 10:15 AM Medical Record Number: [id number] Patient Account Number: [id number] Date of Birth/Sex: 10/20/1922 (82 y.o. Female) Treating RN: Ashok Cordia, Debi Primary Care Physician: Bjorn Pippin Other Clinician: Referring Physician: Bjorn Pippin Treating Physician/Extender: Rudene Re in Treatment: 2 Wound Status Wound Number: 4 Primary Lymphedema Etiology: Wound Location: Right Lower Leg - Circumfernential Secondary Venous Leg Ulcer Etiology: Wounding Event: Gradually Appeared Wound Open Date Acquired: 09/29/2015 Status: Weeks Of Treatment: 2 Comorbid  Cataracts, Glaucoma, Anemia, Clustered Wound: No History: Congestive Heart Failure, Osteoarthritis Photos Photo Uploaded By: Alejandro Mulling on 10/27/2015 16:38:42 Wound Measurements Length: (cm) 12 Width: (cm) 29 Depth: (cm) 0.1 Area: (cm) 273.319 Volume: (cm) 27.332 % Reduction in Area: 52.5% % Reduction in Volume: 52.5% Epithelialization: None Wound Description Classification: Partial Thickness Wound Margin: Flat and Intact Wound Bed Granulation Amount: Medium (34-66%) Exposed Structure Granulation Quality: Red, Pink Fascia Exposed: No Necrotic Amount: Medium (34-66%) Fat Layer Exposed: No Necrotic Quality: Adherent Slough Tendon Exposed: No Muscle Exposed: No Mittelstaedt, Brenley ([id number]) Joint Exposed: No Bone Exposed: No Limited to Skin Breakdown Periwound Skin Texture Texture Color No Abnormalities Noted: No No Abnormalities Noted: No Localized Edema: Yes Moisture No Abnormalities Noted: No Maceration: Yes Moist: Yes Wound Preparation Ulcer Cleansing: Other: soap and water, Treatment Notes Wound #4 (Right, Circumferential Lower Leg) 1. Cleansed with: Cleanse wound with antibacterial soap and water 2. Anesthetic Topical Lidocaine 4% cream to wound bed prior to debridement 3. Peri-wound Care: Barrier cream 4. Dressing Applied: Aquacel Ag 7. Secured with Tape 4 Layer Compression System - Bilateral Notes xtrasorb Electronic Signature(s) Signed: 10/27/2015 4:58:45 PM By: Alejandro Mulling Entered By: Alejandro Mulling on 10/27/2015 11:17:22 KARLYN, GLASCO ([id number]) -------------------------------------------------------------------------------- Wound Assessment Details Patient Name: Heather Zuniga Date of Service: 10/27/2015 10:15 AM Medical Record Number: [id number] Patient Account Number: [id number] Date of Birth/Sex: [birth year] (82 y.o. Female) Treating RN: Ashok Cordia, Debi Primary Care Physician: Bjorn Pippin Other Clinician: Referring Physician:  Bjorn Pippin Treating Physician/Extender: Rudene Re in Treatment: 2 Wound Status Wound Number: 5 Primary Lymphedema Etiology: Wound Location: Left Lower Leg - Circumfernential Wound Open Status: Wounding Event: Gradually Appeared Comorbid Cataracts, Glaucoma, Anemia, Date Acquired: 10/19/2015 History: Congestive Heart Failure, Weeks Of Treatment: 1 Osteoarthritis Clustered Wound: No Photos Photo Uploaded By: Alejandro Mulling on 10/27/2015 16:38:54 Wound Measurements Length: (cm) 13 Width: (cm) 24 Depth: (cm) 0.1 Area: (cm) 245.044 Volume: (cm) 24.504 % Reduction in Area: -1.3% % Reduction in Volume: -1.3% Epithelialization: None Wound Description Classification: Partial Thickness Wound Margin: Flat and  Intact Exudate Amount: Large Exudate Type: Serous Exudate Color: amber Foul Odor After Cleansing: No Wound Bed Granulation Amount: Medium (34-66%) Exposed Structure Granulation Quality: Red, Pink Fascia Exposed: No Necrotic Amount: Medium (34-66%) Fat Layer Exposed: No Bowe, Olyvia ([id number]) Necrotic Quality: Adherent Slough Tendon Exposed: No Muscle Exposed: No Joint Exposed: No Bone Exposed: No Limited to Skin Breakdown Periwound Skin Texture Texture Color No Abnormalities Noted: No No Abnormalities Noted: No Callus: No Atrophie Blanche: No Crepitus: No Cyanosis: No Excoriation: No Ecchymosis: No Fluctuance: No Erythema: No Friable: No Hemosiderin Staining: No Induration: No Mottled: No Localized Edema: No Pallor: No Rash: No Rubor: No Scarring: No Moisture No Abnormalities Noted: No Dry / Scaly: No Maceration: Yes Moist: Yes Wound Preparation Ulcer Cleansing: Other: soap and water, Topical Anesthetic Applied: None Treatment Notes Wound #5 (Left, Circumferential Lower Leg) 1. Cleansed with: Cleanse wound with antibacterial soap and water 2. Anesthetic Topical Lidocaine 4% cream to wound bed prior to debridement 3.  Peri-wound Care: Barrier cream 4. Dressing Applied: Aquacel Ag 7. Secured with Tape 4 Layer Compression System - Bilateral Notes xtrasorb Electronic Signature(s) Signed: 10/27/2015 4:58:45 PM By: Sharen Hones, Myriam Jacobson ([id number]) Entered By: Alejandro Mulling on 10/27/2015 11:17:30 AVAYA, MCJUNKINS ([id number]) -------------------------------------------------------------------------------- Vitals Details Patient Name: Heather Zuniga Date of Service: 10/27/2015 10:15 AM Medical Record Number: [id number] Patient Account Number: [id number] Date of Birth/Sex: 10/30/1922 (82 y.o. Female) Treating RN: Ashok Cordia, Debi Primary Care Physician: Bjorn Pippin Other Clinician: Referring Physician: Bjorn Pippin Treating Physician/Extender: Rudene Re in Treatment: 2 Vital Signs Time Taken: 11:20 Pulse (bpm): 68 Height (in): 61 Respiratory Rate (breaths/min): 18 Weight (lbs): 210 Blood Pressure (mmHg): 153/98 Body Mass Index (BMI): 39.7 Reference Range: 80 - 120 mg / dl Electronic Signature(s) Signed: 10/27/2015 4:58:45 PM By: Alejandro Mulling Entered By: Alejandro Mulling on 10/27/2015 11:20:13

## 2015-10-28 NOTE — Progress Notes (Signed)
[facility] - [place] at [facility]   PATIENT NAME: Heather Zuniga    MR#:  [id number]  DATE OF BIRTH:  [birth year]  SUBJECTIVE:  CHIEF COMPLAINT:   Chief Complaint  Patient presents with  . Altered Mental Status  . Cellulitis   patient is 82 year old African-American female with past medical history significant for history of CK D stage III, obesity, resident of assisted living facility, who walks with a walker at baseline who presents to the hospital with complaints of right lower extremity wounds, hypothermia. In emergency room, she was noted to be afebrile and had normal white blood cell count, her urinalysis was remarkable for pyuria, concerning for urinary tract infection. The patient was confused and had hallucinations over the past 3 weeks, per family members. The patient had poor appetite and generalized weakness. Patient denies any dysuria symptoms. At present, but denies any significant pain, however, if she is not able to provide review of systems  Review of Systems  Unable to perform ROS: medical condition    VITAL SIGNS: Blood pressure 93/50, pulse 65, temperature 98.6 F (37 C), temperature source Oral, resp. rate 20, height 5\' 1"  (1.549 m), weight 95.346 kg (210 lb 3.2 oz), SpO2 98 %.  PHYSICAL EXAMINATION:   GENERAL:  82 y.o.-year-old patient lying in the bed with no acute distress. Moaning, uncomfortable EYES: Pupils equal, round, reactive to light and accommodation. No scleral icterus. Extraocular muscles intact.  HEENT: Head atraumatic, normocephalic. Oropharynx and nasopharynx clear.  NECK:  Supple, no jugular venous distention. No thyroid enlargement, no tenderness.  LUNGS: Some diminished breath sounds bilaterally, poor effort, no wheezing, rales,rhonchi or crepitation. No use of accessory muscles of respiration.  CARDIOVASCULAR: S1, S2 normal. No murmurs, rubs, or gallops.  ABDOMEN: Soft, nontender, nondistended. Bowel sounds present. No  organomegaly or mass.  EXTREMITIES: 3+ lower extremity and pedal edema, no cyanosis, or clubbing. Dressing was placed in the right lower extremity wound, no drainage, purulence was noted. Whitish ointment was placed on the skin NEUROLOGIC: Cranial nerves II through XII are intact. Muscle strength 5/5 in all extremities. Sensation intact. Gait not checked.  PSYCHIATRIC: The patient is very somnolent and not able to assess orientation SKIN: No obvious rash, lesion, or ulcer. Wounds in right lower extremity, dressed, white ointment on the skin bilaterally in lower extremities  ORDERS/RESULTS REVIEWED:   CBC  Recent Labs Lab 10/27/15 1630 10/28/15 0643  WBC 6.7 4.6  HGB 10.2* 10.6*  HCT 31.0* 31.7*  PLT 226 209  MCV 102.6* 102.5*  MCH 33.9 34.4*  MCHC 33.0 33.6  RDW 14.0 14.1   ------------------------------------------------------------------------------------------------------------------  Chemistries   Recent Labs Lab 10/27/15 1630 10/28/15 0643  NA 141 139  K 4.3 4.9  CL 110 110  CO2 25 26  GLUCOSE 84 60*  BUN 51* 40*  CREATININE 1.57* 1.48*  CALCIUM 8.6* 8.0*  AST 42*  --   ALT 27  --   ALKPHOS 143*  --   BILITOT 0.5  --    ------------------------------------------------------------------------------------------------------------------ estimated creatinine clearance is 26.6 mL/min (by C-G formula based on Cr of 1.48). ------------------------------------------------------------------------------------------------------------------ No results for input(s): TSH, T4TOTAL, T3FREE, THYROIDAB in the last 72 hours.  Invalid input(s): FREET3  Cardiac Enzymes No results for input(s): CKMB, TROPONINI, MYOGLOBIN in the last 168 hours.  Invalid input(s): CK ------------------------------------------------------------------------------------------------------------------ Invalid input(s):  POCBNP ---------------------------------------------------------------------------------------------------------------  RADIOLOGY: Dg Chest 2 View  10/27/2015  CLINICAL DATA:  Worsening right lower extremity wounds. Hypothermia. Confusion and hallucinations over the  last 3 weeks. Poor appetite and generalized weakness. EXAM: CHEST  2 VIEW COMPARISON:  04/17/2012 FINDINGS: Borderline heart size with normal pulmonary vascularity. No focal airspace disease or consolidation in the lungs. No blunting of costophrenic angles. No pneumothorax. Mediastinal contours appear intact. Degenerative changes in the spine and shoulders. Tortuous aorta. IMPRESSION: Borderline heart size.  No evidence of active pulmonary disease. Electronically Signed   By: Burman Nieves M.D.   On: 10/27/2015 21:03   Dg Tibia/fibula Left  10/27/2015  CLINICAL DATA:  Multiple bilateral nonhealing wounds of the lower legs. Cellulitis. EXAM: LEFT TIBIA AND FIBULA - 2 VIEW COMPARISON:  None. FINDINGS: There is no fracture or bone destruction. Slight tricompartmental osteoarthritis of the left knee. Diffuse subcutaneous edema with phleboliths in the soft tissues of the lower leg. IMPRESSION: Soft tissue edema. No acute osseous abnormality. Specifically, no evidence of osteomyelitis. Electronically Signed   By: Francene Boyers M.D.   On: 10/27/2015 16:57   Dg Tibia/fibula Right  10/27/2015  CLINICAL DATA:  Recurrent cellulitis of both legs. Multiple bilateral nonhealing wounds in the lower legs. Altered mental status for 3 weeks. EXAM: RIGHT TIBIA AND FIBULA - 2 VIEW COMPARISON:  Limited correlation made with right hip radiographs 01/27/2013. FINDINGS: The bones are demineralized. There is generalized edema throughout the right lower leg. No soft tissue emphysema or foreign body visualized. No evidence of acute fracture, dislocation or bone destruction. Chondrocalcinosis noted at the knee. IMPRESSION: No acute osseous findings or evidence of  osteomyelitis. Generalized soft tissue edema consistent with the given history of cellulitis. Electronically Signed   By: Carey Bullocks M.D.   On: 10/27/2015 16:58    EKG:  Orders placed or performed during the hospital encounter of 10/27/15  . ED EKG  . ED EKG    ASSESSMENT AND PLAN:  Active Problems:   UTI (lower urinary tract infection)  #1. Urinary tract infection due to gram-negative rods, continue patient on Rocephin for now. Following culture results. Blood cultures are negative so far #2. Acute and chronic renal failure, improved with therapy, follow closely. #3. Anemia, stable. #4. Altered mental status, etiology is unclear, getting ABGs to rule out CO2 retention, following closely. #5, right lower extremity wounds, questionable cellulitis, wound care specialist consultation will be requested. #6. Bilateral lower extremity swelling, get Doppler ultrasound to rule out DVT   Management plans discussed with the patient, family and they are in agreement.   DRUG ALLERGIES: No Known Allergies  CODE STATUS:     Code Status Orders        Start     Ordered   10/27/15 1951  Full code   Continuous     10/27/15 1951    Code Status History    Date Active Date Inactive Code Status Order ID Comments User Context   This patient has a current code status but no historical code status.      TOTAL TIME TAKING CARE OF THIS PATIENT: 40 minutes.    Katharina Caper M.D on 10/28/2015 at 1:28 PM  Between 7am to 6pm - Pager - [phone]  After 6pm go to [email or web address] - password [email or web address] [facility]  [facility]  Office  [phone]  CC: Primary care physician; No primary care provider on file.

## 2015-10-28 NOTE — Consult Note (Signed)
WOC wound follow up See previous note this AM: Patient has had wound cultures, limited ultrasound (due to pain and patient refusal).  Is still very uncomfortable.  Will not apply compression this AM.  Will reassess in the AM for compression.  Unable to tolerate due to pain at this time. Xeroform gauze as ordered to nonintact lesions.  WOC team will follow.  Maple Hudson RN BSN CWON Pager [phone]

## 2015-10-28 NOTE — Consult Note (Signed)
WOC wound consult note Reason for Consult: Bilateral chronic venous insufficiency with ulcerations.  Seen at wound care center.  Unnas boots are in place, but only wrapped from ankle to knee.  Edema noted to bilateral feet as well.   Wound type: Chronic venous insufficiency Pressure Ulcer POA: N/A Measurement: Left lateral and posterior calf 6 cm x 10 cm x 0.2 cm  Right lateral and posterior calf 4 cm x 10 cm x 0.2 cm  Wound GQQ:PYPPJ red Drainage (amount, consistency, odor) Moderate drainage.  Musty odor Periwound: Erythema and edema  Dressing procedure/placement/frequency:Cleanse bilateral lower legs with soap and water. Apply Xeroform gauze to nonintact lesions.  Cover with zinc layer and secure with self adherent (Coban) wrap.  Change twice weekly. Leaving open for MD to assess.  Will wrap after MD assesses.  WOC team will follow.  Maple Hudson RN BSN CWON Pager [phone]

## 2015-10-28 NOTE — Clinical Social Work Note (Signed)
Clinical Social Work Assessment  Patient Details  Name: Heather Zuniga MRN: [id number] Date of Birth: 10-20-1922  Date of referral:  10/28/15               Reason for consult:  Facility Placement                Permission sought to share information with:    Permission granted to share information::     Name::        Agency::     Relationship::     Contact Information:     Housing/Transportation Living arrangements for the past 2 months:  Assisted Living Facility Source of Information:  Adult Children Patient Interpreter Needed:  None Criminal Activity/Legal Involvement Pertinent to Current Situation/Hospitalization:  No - Comment as needed Significant Relationships:  Adult Children Lives with:  Facility Resident Do you feel safe going back to the place where you live?  Yes Need for family participation in patient care:  Yes (Comment)  Care giving concerns:  None; patient resides at [facility]   Social Worker assessment / plan:  Patient is unable to hold a conversation this morning.  Liborio Nixon with [facility] came to visit patient and requested an update. Update provided and Liborio Nixon stated that they will take her back at discharge and that we can communicate with Tammy at [facility] when time: [phone]. CSW contacted patient's daughter: Philis Fendt: [phone] this morning and she stated that she does wish for patient to return to [facility] when time and that at baseline, patient was able to ambulate with a walker and go to the dining hall. Patient's daughter wishes to have [facility] transport when time.  Employment status:  Retired Health and safety inspector:  Medicare PT Recommendations:  Not assessed at this time Information / Referral to community resources:     Patient/Family's Response to care:  Patient's daughter expressed appreciation for CSW call.  Patient/Family's Understanding of and Emotional Response to Diagnosis, Current Treatment, and Prognosis:  Patient's daughter  states she is pleased with the care that her mother is receiving at this time.   Emotional Assessment Appearance:  Appears stated age Attitude/Demeanor/Rapport:   (sleeping) Affect (typically observed):  Calm Orientation:  Oriented to Self Alcohol / Substance use:  Not Applicable Psych involvement (Current and /or in the community):  No (Comment)  Discharge Needs  Concerns to be addressed:  Care Coordination Readmission within the last 30 days:  No Current discharge risk:  None Barriers to Discharge:  No Barriers Identified   York Spaniel, LCSW 10/28/2015, 10:24 AM

## 2015-10-29 LAB — URINE CULTURE: Culture: >=100000

## 2015-10-29 NOTE — Care Management Important Message (Signed)
Important Message  Patient Details  Name: Heather Zuniga MRN: [id number] Date of Birth: Sep 01, 1922   Medicare Important Message Given:  Yes    Olegario Messier A Ayssa Bentivegna 10/29/2015, 2:05 PM

## 2015-10-29 NOTE — Evaluation (Signed)
Physical Therapy Evaluation Patient Details Name: Heather Zuniga MRN: [id number] DOB: 11-15-1922 Today's Date: 10/29/2015   History of Present Illness  Patient is a 82 y/o female that presents with acute encephalopathy (and possible underlying Alzheimer's) with confusion and hallucinations x 3 weeks and chronic venous wounds in RLE.   Clinical Impression  Patient admitted with chronic venous insufficiency ulcerations on bilateral LEs. She requires more assistance than normal to complete bed mobility due to deconditioning/trunkal weakness. She demonstrates no change from baseline in ambulation speed or balance, she reports mild pain but is able to tolerate well. She is able to ambulate her baseline distances with RW, though mild deficit in sit to stand transfer. Patient would benefit from PT evaluation at her ALF after discharge for any follow up recommendations regarding mobility status. Otherwise patient appears to be near her baseline mobility status.     Follow Up Recommendations Home health PT (At her ALF will need PT evaluation)    Equipment Recommendations       Recommendations for Other Services       Precautions / Restrictions Precautions Precautions: Fall Restrictions Weight Bearing Restrictions: No      Mobility  Bed Mobility Overal bed mobility: Needs Assistance Bed Mobility: Supine to Sit     Supine to sit: Min assist;Mod assist     General bed mobility comments: Patient able to bring her LEs off the edge of the bed slowly, requires assistance to bring her torso off the bed.   Transfers Overall transfer level: Needs assistance Equipment used: Rolling walker (2 wheeled) Transfers: Sit to/from Stand Sit to Stand: Min assist         General transfer comment: Patient demonstrates posterior lean throughout transfer and mild loss of balance posteriorly. He requires min A to maintain balance and come erect.   Ambulation/Gait Ambulation/Gait assistance: Modified  independent (Device/Increase time) Ambulation Distance (Feet): 150 Feet   Gait Pattern/deviations: WFL(Within Functional Limits);Trunk flexed   Gait velocity interpretation: at or above normal speed for age/gender General Gait Details: Patient demonstrates baseline gait speed and reciprocal stepping pattern. No loss of balance noted.   Stairs            Wheelchair Mobility    Modified Rankin (Stroke Patients Only)       Balance Overall balance assessment: Needs assistance Sitting-balance support: Bilateral upper extremity supported Sitting balance-Leahy Scale: Good     Standing balance support: Bilateral upper extremity supported Standing balance-Leahy Scale: Fair Standing balance comment: No loss of balance with RW during ambulation.                              Pertinent Vitals/Pain Pain Assessment: Faces Faces Pain Scale: Hurts little more Pain Location: R leg  Pain Descriptors / Indicators: Aching Pain Intervention(s): Limited activity within patient's tolerance;Monitored during session;Repositioned    Home Living Family/patient expects to be discharged to:: Assisted living               Home Equipment: Walker - 2 wheels      Prior Function Level of Independence: Independent with assistive device(s)         Comments: Patient has been ambulating to and from the dining hall at ALF.      Hand Dominance        Extremity/Trunk Assessment   Upper Extremity Assessment: Overall WFL for tasks assessed           Lower Extremity  Assessment: Overall WFL for tasks assessed         Communication   Communication: No difficulties  Cognition Arousal/Alertness: Awake/alert Behavior During Therapy: WFL for tasks assessed/performed Overall Cognitive Status: History of cognitive impairments - at baseline (Appears to be some underlying dementia per notes, she seems closer to baseline currently. )                      General  Comments General comments (skin integrity, edema, etc.): Dry flaking skin bilaterally, with venous insufficiency ulcers bilaterally.    Exercises        Assessment/Plan    PT Assessment Patient needs continued PT services  PT Diagnosis Difficulty walking   PT Problem List Decreased strength;Decreased activity tolerance;Decreased balance  PT Treatment Interventions Gait training;Balance training;Therapeutic exercise;Therapeutic activities   PT Goals (Current goals can be found in the Care Plan section) Acute Rehab PT Goals Patient Stated Goal: To return home  PT Goal Formulation: With patient Time For Goal Achievement: 11/12/15 Potential to Achieve Goals: Good    Frequency Min 2X/week   Barriers to discharge        Co-evaluation               End of Session Equipment Utilized During Treatment: Gait belt Activity Tolerance: Patient tolerated treatment well Patient left: in chair;with call bell/phone within reach;with chair alarm set Nurse Communication: Mobility status         Time: 1041-1105 PT Time Calculation (min) (ACUTE ONLY): 24 min   Charges:   PT Evaluation $PT Eval Moderate Complexity: 1 Procedure     PT G Codes:       Kerin Ransom, PT, DPT    10/29/2015, 12:56 PM

## 2015-10-29 NOTE — Consult Note (Signed)
WOC wound consult note Reason for Consult:  Unna boots.  When admitted, wraps were in place from ankle to knee.  Feet were not wrapped and patient indicated she does not wrap her feet.  Feet are edematous and tried to explain that the compression would be more effective if wrapped from below toes to below knees.  Patient was up to chair and had walked with PT.  Wound type:Chronic venous insufficiency with nonintact  Pressure Ulcer POA: N/A Measurement:See previous entry Wound AJO:INOMV red Drainage (amount, consistency, odor) Moderate serosanguinous  Musty odor Periwound:Edema, resolving erythema, nonintact lesions to lateral lower legs Dressing procedure/placement/frequency: Cleanse legs with soap and water.  Vaseline gauze to nonintact lesions.  Zinc layer from ankle to just below knee.  ABD pads over nonintact lesions.  Secure with Coban.  Change weekly.  WOC team will follow.   Maple Hudson RN BSN CWON Pager [phone]

## 2015-10-29 NOTE — Progress Notes (Signed)
[facility] - [place] at [facility]   PATIENT NAME: Heather Zuniga    MR#:  [id number]  DATE OF BIRTH:  1923/05/26  SUBJECTIVE:  CHIEF COMPLAINT:   Chief Complaint  Patient presents with  . Altered Mental Status  . Cellulitis   Patient has no concerns. Has had on and off confusion per nursing staff. Wound dressing to be done today. No family at bedside  Review of Systems  Unable to perform ROS: medical condition    VITAL SIGNS: Blood pressure 105/56, pulse 72, temperature 98.7 F (37.1 C), temperature source Oral, resp. rate 20, height 5\' 1"  (1.549 m), weight 94.53 kg (208 lb 6.4 oz), SpO2 97 %.  PHYSICAL EXAMINATION:   GENERAL:  82 y.o.-year-old patient lying in the bed with no acute distress.  EYES: Pupils equal, round, reactive to light and accommodation. No scleral icterus. Extraocular muscles intact.  HEENT: Head atraumatic, normocephalic. Oropharynx and nasopharynx clear.  NECK:  Supple, no jugular venous distention. No thyroid enlargement, no tenderness.  LUNGS: Some diminished breath sounds bilaterally, poor effort, no wheezing, rales,rhonchi or crepitation. No use of accessory muscles of respiration.  CARDIOVASCULAR: S1, S2 normal. No murmurs, rubs, or gallops.  ABDOMEN: Soft, nontender, nondistended. Bowel sounds present. No organomegaly or mass.  EXTREMITIES: 3+ lower extremity and pedal edema, no cyanosis, or clubbing. Dressing was placed in the right lower extremity wound, no drainage, purulence was noted. Whitish ointment was placed on the skin NEUROLOGIC: Cranial nerves II through XII are intact. Muscle strength 5/5 in all extremities. Sensation intact. Gait not checked.  PSYCHIATRIC: The patient is awake and alert. Not oriented to place or time SKIN: Superficial ulcers on bilateral extremities  ORDERS/RESULTS REVIEWED:   CBC  Recent Labs Lab 10/27/15 1630 10/28/15 0643  WBC 6.7 4.6  HGB 10.2* 10.6*  HCT 31.0* 31.7*  PLT 226 209   MCV 102.6* 102.5*  MCH 33.9 34.4*  MCHC 33.0 33.6  RDW 14.0 14.1   ------------------------------------------------------------------------------------------------------------------  Chemistries   Recent Labs Lab 10/27/15 1630 10/28/15 0643  NA 141 139  K 4.3 4.9  CL 110 110  CO2 25 26  GLUCOSE 84 60*  BUN 51* 40*  CREATININE 1.57* 1.48*  CALCIUM 8.6* 8.0*  AST 42*  --   ALT 27  --   ALKPHOS 143*  --   BILITOT 0.5  --    ------------------------------------------------------------------------------------------------------------------ estimated creatinine clearance is 26.5 mL/min (by C-G formula based on Cr of 1.48). ------------------------------------------------------------------------------------------------------------------ No results for input(s): TSH, T4TOTAL, T3FREE, THYROIDAB in the last 72 hours.  Invalid input(s): FREET3  Cardiac Enzymes No results for input(s): CKMB, TROPONINI, MYOGLOBIN in the last 168 hours.  Invalid input(s): CK ------------------------------------------------------------------------------------------------------------------ Invalid input(s): POCBNP ---------------------------------------------------------------------------------------------------------------  RADIOLOGY: Dg Chest 2 View  10/27/2015  CLINICAL DATA:  Worsening right lower extremity wounds. Hypothermia. Confusion and hallucinations over the last 3 weeks. Poor appetite and generalized weakness. EXAM: CHEST  2 VIEW COMPARISON:  04/17/2012 FINDINGS: Borderline heart size with normal pulmonary vascularity. No focal airspace disease or consolidation in the lungs. No blunting of costophrenic angles. No pneumothorax. Mediastinal contours appear intact. Degenerative changes in the spine and shoulders. Tortuous aorta. IMPRESSION: Borderline heart size.  No evidence of active pulmonary disease. Electronically Signed   By: 06/17/2012 M.D.   On: 10/27/2015 21:03   Dg Tibia/fibula  Left  10/27/2015  CLINICAL DATA:  Multiple bilateral nonhealing wounds of the lower legs. Cellulitis. EXAM: LEFT TIBIA AND FIBULA - 2 VIEW COMPARISON:  None.  FINDINGS: There is no fracture or bone destruction. Slight tricompartmental osteoarthritis of the left knee. Diffuse subcutaneous edema with phleboliths in the soft tissues of the lower leg. IMPRESSION: Soft tissue edema. No acute osseous abnormality. Specifically, no evidence of osteomyelitis. Electronically Signed   By: Francene Boyers M.D.   On: 10/27/2015 16:57   Dg Tibia/fibula Right  10/27/2015  CLINICAL DATA:  Recurrent cellulitis of both legs. Multiple bilateral nonhealing wounds in the lower legs. Altered mental status for 3 weeks. EXAM: RIGHT TIBIA AND FIBULA - 2 VIEW COMPARISON:  Limited correlation made with right hip radiographs 01/27/2013. FINDINGS: The bones are demineralized. There is generalized edema throughout the right lower leg. No soft tissue emphysema or foreign body visualized. No evidence of acute fracture, dislocation or bone destruction. Chondrocalcinosis noted at the knee. IMPRESSION: No acute osseous findings or evidence of osteomyelitis. Generalized soft tissue edema consistent with the given history of cellulitis. Electronically Signed   By: Carey Bullocks M.D.   On: 10/27/2015 16:58   US Venous Img Lower Bilateral  10/28/2015  CLINICAL DATA:  Swelling x1 year.  Color change /ulceration. EXAM: BILATERAL LOWER EXTREMITY VENOUS DOPPLER ULTRASOUND TECHNIQUE: Gray-scale sonography with compression, as well as color and duplex ultrasound, were performed to evaluate the deep venous system from the level of the common femoral vein through the popliteal vein. COMPARISON:  None FINDINGS: On the left, normal compressibility of common femoral, deep femoral, and superficial femoral veins. Patient complained of pain and terminated examination at this point, per technologist. Left popliteal and tibial venous systems incompletely  assessed. No right lower extremity imaging was obtained. IMPRESSION: 1. Limited exam, no evidence of LEFT lower extremity deep vein thrombosis above the popliteal level. Electronically Signed   By: Corlis Leak M.D.   On: 10/28/2015 15:40    EKG:  Orders placed or performed during the hospital encounter of 10/27/15  . ED EKG  . ED EKG    ASSESSMENT AND PLAN:  Active Problems:   UTI (lower urinary tract infection)   # gram-negative rod UTI. Final ID and sensitivities pending. Continue IV ceftriaxone. Blood cultures no growth to date.  #2. Acute and chronic renal failure, improved with therapy, follow closely.  #3. Anemia, stable.  #4. Acute encephalopathy due to UTI. She likely has underlying early Alzheimer's disease.  #5, right lower extremity wounds  wound care consultation  DRUG ALLERGIES: No Known Allergies  CODE STATUS:     Code Status Orders        Start     Ordered   10/27/15 1951  Full code   Continuous     10/27/15 1951    Code Status History    Date Active Date Inactive Code Status Order ID Comments User Context   This patient has a current code status but no historical code status.      TOTAL TIME TAKING CARE OF THIS PATIENT: 40 minutes.   Possible discharge in 1-2 days. We'll have physical therapy evaluate. Discharge back to assisted living versus skilled nursing facility   Milagros Loll R M.D on 10/29/2015 at 10:53 AM  Between 7am to 6pm - Pager - [phone]  After 6pm go to [email or web address] - password [email or web address] [email or web address]  [place] [facility]  Office  [phone]  CC: Primary care physician; No primary care provider on file.

## 2015-10-30 MED ORDER — VANCOMYCIN HCL 10 G IV SOLR
1500.0000 mg | Freq: Once | INTRAVENOUS | Status: AC
  Administered 2015-10-30: 1500 mg via INTRAVENOUS
  Filled 2015-10-30: qty 1500

## 2015-10-30 MED ORDER — CIPROFLOXACIN IN D5W 400 MG/200ML IV SOLN
400.0000 mg | Freq: Once | INTRAVENOUS | Status: AC
  Administered 2015-10-30: 400 mg via INTRAVENOUS
  Filled 2015-10-30: qty 200

## 2015-10-30 MED ORDER — VANCOMYCIN HCL IN DEXTROSE 1-5 GM/200ML-% IV SOLN
1000.0000 mg | INTRAVENOUS | Status: DC
  Administered 2015-10-31: 1000 mg via INTRAVENOUS
  Filled 2015-10-30: qty 200

## 2015-10-30 MED ORDER — CIPROFLOXACIN IN D5W 400 MG/200ML IV SOLN
400.0000 mg | INTRAVENOUS | Status: DC
  Administered 2015-10-31: 400 mg via INTRAVENOUS
  Filled 2015-10-30: qty 200

## 2015-10-30 NOTE — Progress Notes (Signed)
[facility] - [place] at [facility]   PATIENT NAME: Heather Zuniga    MR#:  [id number]  DATE OF BIRTH:  [birth year]  SUBJECTIVE:  CHIEF COMPLAINT:   Chief Complaint  Patient presents with  . Altered Mental Status  . Cellulitis   Patient has no concerns. Has had on and off confusion per nursing staff.  When of boots placed yesterday Afebrile  Review of Systems  Unable to perform ROS: medical condition    VITAL SIGNS: Blood pressure 99/39, pulse 68, temperature 99.2 F (37.3 C), temperature source Oral, resp. rate 18, height 5\' 1"  (1.549 m), weight 95.119 kg (209 lb 11.2 oz), SpO2 98 %.  PHYSICAL EXAMINATION:   GENERAL:  82 y.o.-year-old patient lying in the bed with no acute distress.  EYES: Pupils equal, round, reactive to light and accommodation. No scleral icterus. Extraocular muscles intact.  HEENT: Head atraumatic, normocephalic. Oropharynx and nasopharynx clear.  NECK:  Supple, no jugular venous distention. No thyroid enlargement, no tenderness.  LUNGS: Some diminished breath sounds bilaterally, poor effort, no wheezing, rales,rhonchi or crepitation. No use of accessory muscles of respiration.  CARDIOVASCULAR: S1, S2 normal. No murmurs, rubs, or gallops.  ABDOMEN: Soft, nontender, nondistended. Bowel sounds present. No organomegaly or mass.  EXTREMITIES: 3+ lower extremity and pedal edema, no cyanosis, or clubbing. Dressing was placed in the right lower extremity wound, no drainage, purulence was noted. Whitish ointment was placed on the skin NEUROLOGIC: Cranial nerves II through XII are intact. Muscle strength 5/5 in all extremities. Sensation intact. Gait not checked.  PSYCHIATRIC: The patient is awake and alert. Not oriented to place or time SKIN: Lower extremity Unna boot  ORDERS/RESULTS REVIEWED:   CBC  Recent Labs Lab 10/27/15 1630 10/28/15 0643  WBC 6.7 4.6  HGB 10.2* 10.6*  HCT 31.0* 31.7*  PLT 226 209  MCV 102.6* 102.5*  MCH  33.9 34.4*  MCHC 33.0 33.6  RDW 14.0 14.1   ------------------------------------------------------------------------------------------------------------------  Chemistries   Recent Labs Lab 10/27/15 1630 10/28/15 0643  NA 141 139  K 4.3 4.9  CL 110 110  CO2 25 26  GLUCOSE 84 60*  BUN 51* 40*  CREATININE 1.57* 1.48*  CALCIUM 8.6* 8.0*  AST 42*  --   ALT 27  --   ALKPHOS 143*  --   BILITOT 0.5  --    ------------------------------------------------------------------------------------------------------------------ estimated creatinine clearance is 26.6 mL/min (by C-G formula based on Cr of 1.48). ------------------------------------------------------------------------------------------------------------------ No results for input(s): TSH, T4TOTAL, T3FREE, THYROIDAB in the last 72 hours.  Invalid input(s): FREET3  Cardiac Enzymes No results for input(s): CKMB, TROPONINI, MYOGLOBIN in the last 168 hours.  Invalid input(s): CK ------------------------------------------------------------------------------------------------------------------ Invalid input(s): POCBNP ---------------------------------------------------------------------------------------------------------------  RADIOLOGY: 10/30/15 Venous Img Lower Bilateral  10/28/2015  CLINICAL DATA:  Swelling x1 year.  Color change /ulceration. EXAM: BILATERAL LOWER EXTREMITY VENOUS DOPPLER ULTRASOUND TECHNIQUE: Gray-scale sonography with compression, as well as color and duplex ultrasound, were performed to evaluate the deep venous system from the level of the common femoral vein through the popliteal vein. COMPARISON:  None FINDINGS: On the left, normal compressibility of common femoral, deep femoral, and superficial femoral veins. Patient complained of pain and terminated examination at this point, per technologist. Left popliteal and tibial venous systems incompletely assessed. No right lower extremity imaging was obtained.  IMPRESSION: 1. Limited exam, no evidence of LEFT lower extremity deep vein thrombosis above the popliteal level. Electronically Signed   By: 10/30/2015 M.D.   On: 10/28/2015 15:40  EKG:  Orders placed or performed during the hospital encounter of 10/27/15  . ED EKG  . ED EKG    ASSESSMENT AND PLAN:  Active Problems:   UTI (lower urinary tract infection)   # Escherichia coli UTI Change IV ceftriaxone to ciprofloxacin Blood cultures no growth to date.  # right lower extremity wounds with cellulitis Wound cultures have Staphylococcus and Pseudomonas. Prophylaxis and for vancomycin while we wait for final sensitivities on staff.   # Acute and chronic renal failure, improved with therapy, follow closely.  # Anemia, stable.  # Acute encephalopathy due to UTI. She likely has underlying early Alzheimer's disease.   DRUG ALLERGIES: No Known Allergies  CODE STATUS:     Code Status Orders        Start     Ordered   10/27/15 1951  Full code   Continuous     10/27/15 1951    Code Status History    Date Active Date Inactive Code Status Order ID Comments User Context   This patient has a current code status but no historical code status.      TOTAL TIME TAKING CARE OF THIS PATIENT: 30 minutes.   Discharge tomorrow once culture is finalized from the wound.   Milagros Loll R M.D on 10/30/2015 at 1:31 PM  Between 7am to 6pm - Pager - [phone]  After 6pm go to [email or web address] - password [email or web address] [facility]  [place] [facility]  Office  [phone]  CC: Primary care physician; No primary care provider on file.

## 2015-10-30 NOTE — Consult Note (Addendum)
Pharmacy Antibiotic Note  Heather Zuniga is a 82 y.o. female admitted on 10/27/2015 with wound infection.  Pharmacy has been consulted for vancomycin and cipro dosing.  Plan: Vancomycin 1500mg  once. Then 24 hours after start Vancomycin 1000 IV every 36 hours.  Goal trough 15-20 mcg/mL.  Will draw trough at steady state 3/27 @ 1200 Cipro IV 400mg  q 24 hours due to renal function  Height: 5\' 1"  (154.9 cm) Weight: 209 lb 11.2 oz (95.119 kg) IBW/kg (Calculated) : 47.8  Temp (24hrs), Avg:98.4 F (36.9 C), Min:98.1 F (36.7 C), Max:98.8 F (37.1 C)   Recent Labs Lab 10/27/15 1630 10/28/15 0643  WBC 6.7 4.6  CREATININE 1.57* 1.48*    Estimated Creatinine Clearance: 26.6 mL/min (by C-G formula based on Cr of 1.48).    No Known Allergies  Antimicrobials this admission: vancomycin 3/20 >> 3/20, 3/23>> ceftriaxone 3/20 >> 3/23 Ciprofloxacin 3/23>>  Dose adjustments this admission:   Microbiology results: 3/21 wound : HEAVY GROWTH PSEUDOMONAS AERUGINOSA  HEAVY GROWTH STAPHYLOCOCCUS AUREUS  3/20 MRSA PCR: neg 3/20: urine: ecoli 3/20: blood-negtd  Thank you for allowing pharmacy to be a part of this patient's care.  4/20 10/30/2015 11:23 AM

## 2015-10-31 LAB — WOUND CULTURE

## 2015-10-31 MED ORDER — CIPROFLOXACIN HCL 500 MG PO TABS: 500.0000 mg | ORAL_TABLET | Freq: Two times a day (BID) | ORAL | Status: DC

## 2015-10-31 MED ORDER — AMOXICILLIN-POT CLAVULANATE 875-125 MG PO TABS: 1.0000 | ORAL_TABLET | Freq: Two times a day (BID) | ORAL | Status: DC

## 2015-10-31 NOTE — Discharge Summary (Signed)
[facility] [place] at [facility]   PATIENT NAME: Heather Zuniga    MR#:  [id number]  DATE OF BIRTH:  [birth year]  DATE OF ADMISSION:  10/27/2015 ADMITTING PHYSICIAN: Milagros Loll, MD  DATE OF DISCHARGE: 10/31/2015  PRIMARY CARE PHYSICIAN: No primary care provider on file.   ADMISSION DIAGNOSIS:  SOB (shortness of breath) [R06.02] Somnolence, daytime [G47.10] Hypothermia, initial encounter [T68.XXXA] Multiple open wounds of lower leg, unspecified laterality, initial encounter [S81.809A] Altered mental status, unspecified altered mental status type [R41.82]  DISCHARGE DIAGNOSIS:  Active Problems:   UTI (lower urinary tract infection)   SECONDARY DIAGNOSIS:   Past Medical History  Diagnosis Date  . GERD (gastroesophageal reflux disease)   . Anemia   . Arthritis   . Pyuria   . Edema   . Depression   . CKD (chronic kidney disease) stage 3, GFR 30-59 ml/min      ADMITTING HISTORY  Heather Zuniga is a 82 y.o. female with a known history of CKD stage III, obesity a resident of local assisted living facility who walks with a walker at baseline presents to the emergency room sent in from wound care center due to worsening right lower extremity wounds. Patient has had chronic lower extremity wounds. Today in the emergency room she has been found to be hypothermic at 94. Afebrile with normal white count. Patient has been confused with hallucinations over the last 3 weeks as per daughter at bedside. Poor appetite and worsening generalized weakness.  HOSPITAL COURSE:   # Escherichia coli UTI Changed IV ceftriaxone to ciprofloxacin Blood cultures no growth to date.  # right lower extremity wounds with cellulitis Wound cultures have MSSA, Enterococcus and Pseudomonas. Augmentin 1 week Ciprofloxacin 1 week  # Acute and chronic renal failure, improved with therapy, follow closely.  # Anemia, stable.  # Acute encephalopathy due to UTI. 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  Patient is back to baseline. Good appetite. Has some memory lapse but is oriented.  Stable for d/c to ALF with [facility]  Discussed discharge plan with daughter over the phone. All questions answered.  CONSULTS OBTAINED:     DRUG ALLERGIES:  No Known Allergies  DISCHARGE MEDICATIONS:   Current Discharge Medication List    START taking these medications   Details  amoxicillin-clavulanate (AUGMENTIN) 875-125 MG tablet Take 1 tablet by mouth 2 (two) times daily. Qty: 14 tablet, Refills: 0    ciprofloxacin (CIPRO) 500 MG tablet Take 1 tablet (500 mg total) by mouth 2 (two) times daily. Qty: 14 tablet, Refills: 0      CONTINUE these medications which have NOT CHANGED   Details  acetaminophen (TYLENOL) 650 MG CR tablet Take 1,300 mg by mouth 2 (two) times daily.    albuterol (PROVENTIL HFA;VENTOLIN HFA) 108 (90 Base) MCG/ACT inhaler Inhale 2 puffs into the lungs every 4 (four) hours as needed for wheezing or shortness of breath.    ferrous sulfate 325 (65 FE) MG tablet Take 325 mg by mouth daily.    latanoprost (XALATAN) 0.005 % ophthalmic solution Place 1 drop into both eyes at bedtime.     levothyroxine (SYNTHROID, LEVOTHROID) 25 MCG tablet Take 25 mcg by mouth daily before breakfast.    Multiple Vitamin (MULTIVITAMIN WITH MINERALS) TABS tablet Take 1 tablet by mouth daily.    omeprazole (PRILOSEC) 20 MG capsule Take 20 mg by mouth daily.     Polyethyl Glycol-Propyl Glycol (SYSTANE ULTRA) 0.4-0.3 % SOLN Place 1 drop into both  eyes 2 (two) times daily.    potassium chloride (K-DUR,KLOR-CON) 10 MEQ tablet Take 10 mEq by mouth daily.     spironolactone (ALDACTONE) 25 MG tablet Take 25 mg by mouth daily.     timolol (TIMOPTIC) 0.5 % ophthalmic solution Place 1 drop into the left eye daily.    torsemide (DEMADEX) 20 MG tablet Take 20 mg by mouth daily.         Today   VITAL SIGNS:  Blood pressure 102/52, pulse 76, temperature 98.3 F  (36.8 C), temperature source Oral, resp. rate 19, height 5\' 1"  (1.549 m), weight 94.348 kg (208 lb), SpO2 97 %.  I/O:   Intake/Output Summary (Last 24 hours) at 10/31/15 1429 Last data filed at 10/31/15 1200  Gross per 24 hour  Intake 1180.7 ml  Output    300 ml  Net  880.7 ml    PHYSICAL EXAMINATION:  Physical Exam  GENERAL:  82 y.o.-year-old patient lying in the bed with no acute distress. Obese LUNGS: Normal breath sounds bilaterally, no wheezing, rales,rhonchi or crepitation. No use of accessory muscles of respiration.  CARDIOVASCULAR: S1, S2 normal. No murmurs, rubs, or gallops.  ABDOMEN: Soft, non-tender, non-distended. Bowel sounds present. No organomegaly or mass.  NEUROLOGIC: Moves all 4 extremities. PSYCHIATRIC: The patient is alert and awake SKIN: No obvious rash, lesion, or ulcer.  No bruits and bilateral lower extremity's  DATA REVIEW:   CBC  Recent Labs Lab 10/28/15 0643  WBC 4.6  HGB 10.6*  HCT 31.7*  PLT 209    Chemistries   Recent Labs Lab 10/27/15 1630 10/28/15 0643  NA 141 139  K 4.3 4.9  CL 110 110  CO2 25 26  GLUCOSE 84 60*  BUN 51* 40*  CREATININE 1.57* 1.48*  CALCIUM 8.6* 8.0*  AST 42*  --   ALT 27  --   ALKPHOS 143*  --   BILITOT 0.5  --     Cardiac Enzymes No results for input(s): TROPONINI in the last 168 hours.  Microbiology Results  Results for orders placed or performed during the hospital encounter of 10/27/15  Blood culture (routine x 2)     Status: None (Preliminary result)   Collection Time: 10/27/15  4:30 PM  Result Value Ref Range Status   Specimen Description BLOOD RIGHT ASSIST CONTROL  Final   Special Requests BOTTLES DRAWN AEROBIC AND ANAEROBIC  5CC  Final   Culture NO GROWTH 3 DAYS  Final   Report Status PENDING  Incomplete  Urine culture     Status: None   Collection Time: 10/27/15  4:30 PM  Result Value Ref Range Status   Specimen Description URINE, RANDOM  Final   Special Requests NONE  Final    Culture >=100,000 COLONIES/mL ESCHERICHIA COLI  Final   Report Status 10/29/2015 FINAL  Final   Organism ID, Bacteria ESCHERICHIA COLI  Final      Susceptibility   Escherichia coli - MIC*    AMPICILLIN >=32 RESISTANT Resistant     CEFAZOLIN <=4 SENSITIVE Sensitive     CEFTRIAXONE <=1 SENSITIVE Sensitive     CIPROFLOXACIN 1 SENSITIVE Sensitive     GENTAMICIN <=1 SENSITIVE Sensitive     IMIPENEM <=0.25 SENSITIVE Sensitive     NITROFURANTOIN <=16 SENSITIVE Sensitive     TRIMETH/SULFA >=320 RESISTANT Resistant     AMPICILLIN/SULBACTAM 16 INTERMEDIATE Intermediate     PIP/TAZO <=4 SENSITIVE Sensitive     Extended ESBL NEGATIVE Sensitive     * >=100,000  COLONIES/mL ESCHERICHIA COLI  Blood culture (routine x 2)     Status: None (Preliminary result)   Collection Time: 10/27/15  4:31 PM  Result Value Ref Range Status   Specimen Description BLOOD RIGHT FATTY CASTS  Final   Special Requests BOTTLES DRAWN AEROBIC AND ANAEROBIC  5CC  Final   Culture NO GROWTH 3 DAYS  Final   Report Status PENDING  Incomplete  MRSA PCR Screening     Status: None   Collection Time: 10/27/15 10:55 PM  Result Value Ref Range Status   MRSA by PCR NEGATIVE NEGATIVE Final    Comment:        The GeneXpert MRSA Assay (FDA approved for NASAL specimens only), is one component of a comprehensive MRSA colonization surveillance program. It is not intended to diagnose MRSA infection nor to guide or monitor treatment for MRSA infections.   Wound culture     Status: None   Collection Time: 10/28/15  3:44 PM  Result Value Ref Range Status   Specimen Description ARM  Final   Special Requests NONE  Final   Gram Stain MODERATE WBC SEEN MANY GRAM NEGATIVE RODS   Final   Culture   Final    HEAVY GROWTH PSEUDOMONAS AERUGINOSA HEAVY GROWTH ENTEROCOCCUS FAECALIS HEAVY GROWTH STAPHYLOCOCCUS AUREUS    Report Status 10/31/2015 FINAL  Final   Organism ID, Bacteria PSEUDOMONAS AERUGINOSA  Final   Organism ID, Bacteria  ENTEROCOCCUS FAECALIS  Final   Organism ID, Bacteria STAPHYLOCOCCUS AUREUS  Final      Susceptibility   Staphylococcus aureus - MIC*    CIPROFLOXACIN <=0.5 SENSITIVE Sensitive     ERYTHROMYCIN <=0.25 SENSITIVE Sensitive     GENTAMICIN <=0.5 SENSITIVE Sensitive     OXACILLIN <=0.25 SENSITIVE Sensitive     TETRACYCLINE <=1 SENSITIVE Sensitive     VANCOMYCIN 1 SENSITIVE Sensitive     TRIMETH/SULFA <=10 SENSITIVE Sensitive     CLINDAMYCIN <=0.25 SENSITIVE Sensitive     RIFAMPIN <=0.5 SENSITIVE Sensitive     Inducible Clindamycin NEGATIVE Sensitive     * HEAVY GROWTH STAPHYLOCOCCUS AUREUS   Pseudomonas aeruginosa - MIC*    CEFTAZIDIME <=1 SENSITIVE Sensitive     CIPROFLOXACIN <=0.25 SENSITIVE Sensitive     GENTAMICIN <=1 SENSITIVE Sensitive     IMIPENEM 1 SENSITIVE Sensitive     CEFEPIME <=1 SENSITIVE Sensitive     * HEAVY GROWTH PSEUDOMONAS AERUGINOSA   Enterococcus faecalis - MIC*    AMPICILLIN <=2 SENSITIVE Sensitive     VANCOMYCIN 1 SENSITIVE Sensitive     GENTAMICIN SYNERGY SENSITIVE Sensitive     LINEZOLID 2 SENSITIVE Sensitive     * HEAVY GROWTH ENTEROCOCCUS FAECALIS    RADIOLOGY:  No results found.  Follow up with PCP in 1 week.  Management plans discussed with the patient, family and they are in agreement.  CODE STATUS:     Code Status Orders        Start     Ordered   10/27/15 1951  Full code   Continuous     10/27/15 1951    Code Status History    Date Active Date Inactive Code Status Order ID Comments User Context   This patient has a current code status but no historical code status.      TOTAL TIME TAKING CARE OF THIS PATIENT ON DAY OF DISCHARGE: more than 30 minutes.   Milagros Loll R M.D on 10/31/2015 at 2:29 PM  Between 7am to 6pm - Pager -  [phone]  After 6pm go to [email or web address] - password [email or web address] [facility]  [facility]  Office  [phone]  CC: Primary care physician; No primary care provider on file.  Note: This dictation  was prepared with Dragon dictation along with smaller phrase technology. Any transcriptional errors that result from this process are unintentional.

## 2015-10-31 NOTE — Discharge Instructions (Signed)
°  DIET:  Cardiac diet  DISCHARGE CONDITION:  Stable  ACTIVITY:  Activity as tolerated  OXYGEN:  Home Oxygen: No.   Oxygen Delivery: room air  DISCHARGE LOCATION:  Assisted living  If you experience worsening of your admission symptoms, develop shortness of breath, life threatening emergency, suicidal or homicidal thoughts you must seek medical attention immediately by calling 911 or calling your MD immediately  if symptoms less severe.  You Must read complete instructions/literature along with all the possible adverse reactions/side effects for all the Medicines you take and that have been prescribed to you. Take any new Medicines after you have completely understood and accpet all the possible adverse reactions/side effects.   Please note  You were cared for by a hospitalist during your hospital stay. If you have any questions about your discharge medications or the care you received while you were in the hospital after you are discharged, you can call the unit and asked to speak with the hospitalist on call if the hospitalist that took care of you is not available. Once you are discharged, your primary care physician will handle any further medical issues. Please note that NO REFILLS for any discharge medications will be authorized once you are discharged, as it is imperative that you return to your primary care physician (or establish a relationship with a primary care physician if you do not have one) for your aftercare needs so that they can reassess your need for medications and monitor your lab values.  Cleanse bilateral lower legs with soap and water. Apply Xeroform gauze to nonintact lesions. Cover with zinc layer and secure with self adherent (Coban) wrap. Change twice weekly.

## 2015-10-31 NOTE — Care Management (Signed)
Patient to be discharged back to [facility] today.  Patient was already open with [facility].  Home health orders have been placed for RN and PT.  I have notified Abby with [facility] of discharge plan.  RNCM signing off

## 2015-10-31 NOTE — Progress Notes (Signed)
Patient discharged back to [facility] who will transport to facility.  IV removed intact.  Discharge packet given.  Chryl Heck RN.

## 2015-10-31 NOTE — Care Management Important Message (Signed)
Important Message  Patient Details  Name: Heather Zuniga MRN: [id number] Date of Birth: 1923/05/07   Medicare Important Message Given:  Yes    Olegario Messier A Candise Crabtree 10/31/2015, 1:00 PM

## 2015-10-31 NOTE — NC FL2 (Signed)
[place] MEDICAID FL2 LEVEL OF CARE SCREENING TOOL     IDENTIFICATION  Patient Name: Heather Zuniga Birthdate: 07/02/1923 Sex: female Admission Date (Current Location): 10/27/2015  [place] and [place] Number:  Chiropodist and Address:  [facility], [address] [place], [place] [address]      Provider Number: [id number]  Attending Physician Name and Address:  Milagros Loll, MD  Relative Name and Phone Number:       Current Level of Care: Hospital Recommended Level of Care: Assisted Living Facility Prior Approval Number:    Date Approved/Denied:   PASRR Number:    Discharge Plan:  (ALF)    Current Diagnoses: Patient Active Problem List   Diagnosis Date Noted  . UTI (lower urinary tract infection) 10/27/2015  . GERD (gastroesophageal reflux disease) 06/09/2015  . Obesity 06/09/2015  . Arthritis 06/09/2015  . Edema 06/09/2015  . Stasis leg ulcer (HCC) 06/09/2015  . Absolute anemia 03/23/2014  . Chronic kidney disease (CKD), stage III (moderate) 03/23/2014  . Arthritis, degenerative 03/23/2014    Orientation RESPIRATION BLADDER Height & Weight     Self  Normal Incontinent Weight: 208 lb (94.348 kg) Height:  5\' 1"  (154.9 cm)  BEHAVIORAL SYMPTOMS/MOOD NEUROLOGICAL BOWEL NUTRITION STATUS   (none)   Incontinent Diet (regular)  AMBULATORY STATUS COMMUNICATION OF NEEDS Skin   Limited Assist Verbally Normal                       Personal Care Assistance Level of Assistance  Bathing, Dressing Bathing Assistance: Limited assistance   Dressing Assistance: Limited assistance     Functional Limitations Info             SPECIAL CARE FACTORS FREQUENCY                       Contractures Contractures Info: Not present    Additional Factors Info                  DISCHARGE MEDICATIONS:   Current Discharge Medication List    START taking these medications   Details  amoxicillin-clavulanate  (AUGMENTIN) 875-125 MG tablet Take 1 tablet by mouth 2 (two) times daily. Qty: 14 tablet, Refills: 0    ciprofloxacin (CIPRO) 500 MG tablet Take 1 tablet (500 mg total) by mouth 2 (two) times daily. Qty: 14 tablet, Refills: 0      CONTINUE these medications which have NOT CHANGED   Details  acetaminophen (TYLENOL) 650 MG CR tablet Take 1,300 mg by mouth 2 (two) times daily.    albuterol (PROVENTIL HFA;VENTOLIN HFA) 108 (90 Base) MCG/ACT inhaler Inhale 2 puffs into the lungs every 4 (four) hours as needed for wheezing or shortness of breath.    ferrous sulfate 325 (65 FE) MG tablet Take 325 mg by mouth daily.    latanoprost (XALATAN) 0.005 % ophthalmic solution Place 1 drop into both eyes at bedtime.     levothyroxine (SYNTHROID, LEVOTHROID) 25 MCG tablet Take 25 mcg by mouth daily before breakfast.    Multiple Vitamin (MULTIVITAMIN WITH MINERALS) TABS tablet Take 1 tablet by mouth daily.    omeprazole (PRILOSEC) 20 MG capsule Take 20 mg by mouth daily.     Polyethyl Glycol-Propyl Glycol (SYSTANE ULTRA) 0.4-0.3 % SOLN Place 1 drop into both eyes 2 (two) times daily.    potassium chloride (K-DUR,KLOR-CON) 10 MEQ tablet Take 10 mEq by mouth daily.     spironolactone (ALDACTONE)  25 MG tablet Take 25 mg by mouth daily.     timolol (TIMOPTIC) 0.5 % ophthalmic solution Place 1 drop into the left eye daily.    torsemide (DEMADEX) 20 MG tablet Take 20 mg by mouth daily.             Discharge Medications: Please see discharge summary for a list of discharge medications.  Relevant Imaging Results:  Relevant Lab Results:   Additional Information    York Spaniel, LCSW

## 2015-11-01 LAB — CULTURE, BLOOD (ROUTINE X 2)
CULTURE: NO GROWTH
Culture: NO GROWTH

## 2015-11-03 ENCOUNTER — Encounter: Payer: Medicare Other | Admitting: Surgery

## 2015-11-03 DIAGNOSIS — H5441 Blindness, right eye, normal vision left eye: Secondary | ICD-10-CM | POA: Diagnosis not present

## 2015-11-03 DIAGNOSIS — L97221 Non-pressure chronic ulcer of left calf limited to breakdown of skin: Secondary | ICD-10-CM | POA: Diagnosis not present

## 2015-11-03 DIAGNOSIS — Z6839 Body mass index (BMI) 39.0-39.9, adult: Secondary | ICD-10-CM | POA: Diagnosis not present

## 2015-11-03 DIAGNOSIS — D649 Anemia, unspecified: Secondary | ICD-10-CM | POA: Diagnosis not present

## 2015-11-03 DIAGNOSIS — L97211 Non-pressure chronic ulcer of right calf limited to breakdown of skin: Secondary | ICD-10-CM | POA: Diagnosis not present

## 2015-11-03 DIAGNOSIS — I509 Heart failure, unspecified: Secondary | ICD-10-CM | POA: Diagnosis not present

## 2015-11-03 DIAGNOSIS — Z87440 Personal history of urinary (tract) infections: Secondary | ICD-10-CM | POA: Diagnosis not present

## 2015-11-03 DIAGNOSIS — I890 Lymphedema, not elsewhere classified: Secondary | ICD-10-CM | POA: Diagnosis present

## 2015-11-03 DIAGNOSIS — N183 Chronic kidney disease, stage 3 (moderate): Secondary | ICD-10-CM | POA: Diagnosis not present

## 2015-11-03 DIAGNOSIS — M1990 Unspecified osteoarthritis, unspecified site: Secondary | ICD-10-CM | POA: Diagnosis not present

## 2015-11-03 DIAGNOSIS — K219 Gastro-esophageal reflux disease without esophagitis: Secondary | ICD-10-CM | POA: Diagnosis not present

## 2015-11-03 DIAGNOSIS — F329 Major depressive disorder, single episode, unspecified: Secondary | ICD-10-CM | POA: Diagnosis not present

## 2015-11-04 NOTE — Progress Notes (Signed)
Heather, Zuniga ([id number]) Visit Report for 11/03/2015 Arrival Information Details Patient Name: Heather Zuniga, Heather Zuniga Date of Service: 11/03/2015 10:30 AM Medical Record Number: [id number] Patient Account Number: [id number] Date of Birth/Sex: May 13, 1923 (82 y.o. Female) Treating RN: Phillis Haggis Primary Care Physician: Bjorn Pippin Other Clinician: Referring Physician: Bjorn Pippin Treating Physician/Extender: Rudene Re in Treatment: 3 Visit Information History Since Last Visit All ordered tests and consults were completed: No Patient Arrived: Wheel Chair Added or deleted any medications: No Arrival Time: 10:38 Any new allergies or adverse reactions: No Accompanied By: caregiver Had a fall or experienced change in No Transfer Assistance: EasyPivot activities of daily living that may affect Patient Lift risk of falls: Patient Requires Transmission- No Signs or symptoms of abuse/neglect since last No Based Precautions: visito Patient Has Alerts: No Hospitalized since last visit: No Pain Present Now: No Electronic Signature(s) Signed: 11/03/2015 5:48:30 PM By: Alejandro Mulling Entered By: Alejandro Mulling on 11/03/2015 10:38:53 Heather Zuniga ([id number]) -------------------------------------------------------------------------------- Encounter Discharge Information Details Patient Name: Heather Zuniga Date of Service: 11/03/2015 10:30 AM Medical Record Number: [id number] Patient Account Number: [id number] Date of Birth/Sex: 1923-03-02 (82 y.o. Female) Treating RN: Phillis Haggis Primary Care Physician: Bjorn Pippin Other Clinician: Referring Physician: Bjorn Pippin Treating Physician/Extender: Rudene Re in Treatment: 3 Encounter Discharge Information Items Discharge Pain Level: 0 Discharge Condition: Stable Ambulatory Status: Wheelchair Discharge Destination: Nursing Home Transportation: Other Accompanied By: caregiver Schedule Follow-up Appointment: Yes Medication  Reconciliation completed and provided to Patient/Care Yes Laylamarie Meuser: Provided on Clinical Summary of Care: 11/03/2015 Form Type Recipient Paper Patient HB Electronic Signature(s) Signed: 11/03/2015 11:27:09 AM By: Gwenlyn Perking Entered By: Gwenlyn Perking on 11/03/2015 11:27:08 Heather Zuniga ([id number]) -------------------------------------------------------------------------------- Lower Extremity Assessment Details Patient Name: Heather Zuniga Date of Service: 11/03/2015 10:30 AM Medical Record Number: [id number] Patient Account Number: [id number] Date of Birth/Sex: [birth year] (82 y.o. Female) Treating RN: Ashok Cordia, Debi Primary Care Physician: Bjorn Pippin Other Clinician: Referring Physician: Bjorn Pippin Treating Physician/Extender: Rudene Re in Treatment: 3 Edema Assessment Assessed: [Left: No] [Right: No] E[Left: dema] [Right: :] Calf Left: Right: Point of Measurement: cm From Medial Instep 44.5 cm 45 cm Ankle Left: Right: Point of Measurement: cm From Medial Instep 28.5 cm 30 cm Vascular Assessment Pulses: Posterior Tibial Dorsalis Pedis Palpable: [Left:Yes] [Right:Yes] Extremity colors, hair growth, and conditions: Extremity Color: [Left:Hyperpigmented] [Right:Hyperpigmented] Temperature of Extremity: [Left:Warm] [Right:Warm] Capillary Refill: [Left:< 3 seconds] [Right:< 3 seconds] Toe Nail Assessment Left: Right: Thick: Yes Yes Discolored: No No Deformed: No Improper Length and Hygiene: Yes Yes Electronic Signature(s) Signed: 11/03/2015 5:48:30 PM By: Alejandro Mulling Entered By: Alejandro Mulling on 11/03/2015 10:58:26 Heather Zuniga ([id number]) -------------------------------------------------------------------------------- Multi Wound Chart Details Patient Name: Heather Zuniga Date of Service: 11/03/2015 10:30 AM Medical Record Number: [id number] Patient Account Number: [id number] Date of Birth/Sex: [birth year] (82 y.o. Female) Treating RN: Phillis Haggis Primary Care Physician: Bjorn Pippin Other Clinician: Referring Physician: Bjorn Pippin Treating Physician/Extender: Rudene Re in Treatment: 3 Vital Signs Height(in): 61 Pulse(bpm): 64 Weight(lbs): 210 Blood Pressure 148/62 (mmHg): Body Mass Index(BMI): 40 Temperature(F): 97.6 Respiratory Rate 18 (breaths/min): Photos: [4:No Photos] [5:No Photos] [N/A:N/A] Wound Location: [4:Right Lower Leg - Circumfernential] [5:Left Lower Leg - Circumfernential] [N/A:N/A] Wounding Event: [4:Gradually Appeared] [5:Gradually Appeared] [N/A:N/A] Primary Etiology: [4:Lymphedema] [5:Lymphedema] [N/A:N/A] Secondary Etiology: [4:Venous Leg Ulcer] [5:N/A] [N/A:N/A] Comorbid History: [4:Cataracts, Glaucoma, Anemia, Congestive Heart Anemia, Congestive Heart Failure, Osteoarthritis Failure, Osteoarthritis] [5:Cataracts, Glaucoma,] [N/A:N/A] Date Acquired: [4:09/29/2015] [5:10/19/2015] [N/A:N/A] Weeks of Treatment: [4:3] [5:2] [N/A:N/A] Wound Status: [4:Open] [5:Open] [N/A:N/A] Measurements L x  W x D 15x14x0.1 [5:11x14x0.1] [N/A:N/A] (cm) Area (cm) : [2:725.366] [5:120.951] [N/A:N/A] Volume (cm) : [4:16.493] [5:12.095] [N/A:N/A] % Reduction in Area: [4:71.30%] [5:50.00%] [N/A:N/A] % Reduction in Volume: 71.30% [5:50.00%] [N/A:N/A] Classification: [4:Partial Thickness] [5:Partial Thickness] [N/A:N/A] Exudate Amount: [4:Large] [5:Large] [N/A:N/A] Exudate Type: [4:Serosanguineous] [5:Serosanguineous] [N/A:N/A] Exudate Color: [4:red, brown] [5:red, brown] [N/A:N/A] Wound Margin: [4:Flat and Intact] [5:Flat and Intact] [N/A:N/A] Granulation Amount: [4:Medium (34-66%)] [5:Medium (34-66%)] [N/A:N/A] Granulation Quality: [4:Red, Pink] [5:Red, Pink] [N/A:N/A] Necrotic Amount: [4:Medium (34-66%)] [5:Medium (34-66%)] [N/A:N/A] Exposed Structures: [4:Fascia: No Fat: No Tendon: No Muscle: No] [5:Fascia: No Fat: No Tendon: No Muscle: No] [N/A:N/A] Joint: No Joint: No Bone: No Bone: No Limited to  Skin Limited to Skin Breakdown Breakdown Epithelialization: None None N/A Periwound Skin Texture: Edema: Yes Edema: No N/A Excoriation: No Induration: No Callus: No Crepitus: No Fluctuance: No Friable: No Rash: No Scarring: No Periwound Skin Maceration: Yes Maceration: Yes N/A Moisture: Moist: Yes Moist: Yes Dry/Scaly: No Periwound Skin Color: No Abnormalities Noted Atrophie Blanche: No N/A Cyanosis: No Ecchymosis: No Erythema: No Hemosiderin Staining: No Mottled: No Pallor: No Rubor: No Temperature: No Abnormality No Abnormality N/A Tenderness on Yes Yes N/A Palpation: Wound Preparation: Ulcer Cleansing: Other: Ulcer Cleansing: Other: N/A soap and water soap and water Topical Anesthetic Topical Anesthetic Applied: Other: lidocaine Applied: Other: lidocaine 4% 4% Treatment Notes Electronic Signature(s) Signed: 11/03/2015 5:48:30 PM By: Alejandro Mulling Entered By: Alejandro Mulling on 11/03/2015 11:04:12 DEETYA, KAUTZER ([id number]) -------------------------------------------------------------------------------- Multi-Disciplinary Care Plan Details Patient Name: Heather Zuniga Date of Service: 11/03/2015 10:30 AM Medical Record Number: [id number] Patient Account Number: [id number] Date of Birth/Sex: 11-12-1922 (82 y.o. Female) Treating RN: Phillis Haggis Primary Care Physician: Bjorn Pippin Other Clinician: Referring Physician: Bjorn Pippin Treating Physician/Extender: Rudene Re in Treatment: 3 Active Inactive Abuse / Safety / Falls / Self Care Management Nursing Diagnoses: Potential for falls Goals: Patient will remain injury free Date Initiated: 10/13/2015 Goal Status: Active Interventions: Assess fall risk on admission and as needed Notes: Orientation to the Wound Care Program Nursing Diagnoses: Knowledge deficit related to the wound healing center program Goals: Patient/caregiver will verbalize understanding of the Wound Healing Center Program Date  Initiated: 10/13/2015 Goal Status: Active Interventions: Provide education on orientation to the wound center Notes: Pain, Acute or Chronic Nursing Diagnoses: Pain, acute or chronic: actual or potential Potential alteration in comfort, pain GoalsBREELEY, PEPPLE ([id number]) Patient will verbalize adequate pain control and receive pain control interventions during procedures as needed Date Initiated: 10/13/2015 Goal Status: Active Interventions: Assess comfort goal upon admission Complete pain assessment as per visit requirements Notes: Wound/Skin Impairment Nursing Diagnoses: Impaired tissue integrity Goals: Ulcer/skin breakdown will have a volume reduction of 30% by week 4 Date Initiated: 10/13/2015 Goal Status: Active Ulcer/skin breakdown will have a volume reduction of 50% by week 8 Date Initiated: 10/13/2015 Goal Status: Active Ulcer/skin breakdown will have a volume reduction of 80% by week 12 Date Initiated: 10/13/2015 Goal Status: Active Interventions: Assess patient/caregiver ability to obtain necessary supplies Assess ulceration(s) every visit Notes: Electronic Signature(s) Signed: 11/03/2015 5:48:30 PM By: Alejandro Mulling Entered By: Alejandro Mulling on 11/03/2015 11:03:47 Heather Zuniga ([id number]) -------------------------------------------------------------------------------- Pain Assessment Details Patient Name: Heather Zuniga Date of Service: 11/03/2015 10:30 AM Medical Record Number: [id number] Patient Account Number: [id number] Date of Birth/Sex: [birth year] (82 y.o. Female) Treating RN: Phillis Haggis Primary Care Physician: Bjorn Pippin Other Clinician: Referring Physician: Bjorn Pippin Treating Physician/Extender: Rudene Re in Treatment: 3 Active Problems Location of Pain Severity and Description of Pain Patient Has  Paino No Site Locations Pain Management and Medication Current Pain Management: Electronic Signature(s) Signed: 11/03/2015 5:48:30 PM  By: Alejandro Mulling Entered By: Alejandro Mulling on 11/03/2015 10:39:08 Heather Zuniga ([id number]) -------------------------------------------------------------------------------- Patient/Caregiver Education Details Patient Name: Heather Zuniga Date of Service: 11/03/2015 10:30 AM Medical Record Number: [id number] Patient Account Number: [id number] Date of Birth/Gender: 1923/01/16 (82 y.o. Female) Treating RN: Phillis Haggis Primary Care Physician: Bjorn Pippin Other Clinician: Referring Physician: Bjorn Pippin Treating Physician/Extender: Rudene Re in Treatment: 3 Education Assessment Education Provided To: Patient and Caregiver Education Topics Provided Wound/Skin Impairment: Handouts: Other: keep wraps dry, change dressing as ordered Methods: Demonstration, Explain/Verbal Responses: State content correctly Electronic Signature(s) Signed: 11/03/2015 5:48:30 PM By: Alejandro Mulling Entered By: Alejandro Mulling on 11/03/2015 11:27:10 Heather Zuniga ([id number]) -------------------------------------------------------------------------------- Wound Assessment Details Patient Name: Heather Zuniga Date of Service: 11/03/2015 10:30 AM Medical Record Number: [id number] Patient Account Number: [id number] Date of Birth/Sex: 1922/10/06 (82 y.o. Female) Treating RN: Ashok Cordia, Debi Primary Care Physician: Bjorn Pippin Other Clinician: Referring Physician: Bjorn Pippin Treating Physician/Extender: Rudene Re in Treatment: 3 Wound Status Wound Number: 4 Primary Lymphedema Etiology: Wound Location: Right Lower Leg - Circumfernential Secondary Venous Leg Ulcer Etiology: Wounding Event: Gradually Appeared Wound Open Date Acquired: 09/29/2015 Status: Weeks Of Treatment: 3 Comorbid Cataracts, Glaucoma, Anemia, Clustered Wound: No History: Congestive Heart Failure, Osteoarthritis Photos Photo Uploaded By: Alejandro Mulling on 11/03/2015 17:16:36 Wound Measurements Length: (cm)  15 Width: (cm) 14 Depth: (cm) 0.1 Area: (cm) 164.934 Volume: (cm) 16.493 % Reduction in Area: 71.3% % Reduction in Volume: 71.3% Epithelialization: None Tunneling: No Undermining: No Wound Description Classification: Partial Thickness Wound Margin: Flat and Intact Exudate Amount: Large Exudate Type: Serosanguineous Exudate Color: red, brown Foul Odor After Cleansing: No Wound Bed Granulation Amount: Medium (34-66%) Exposed Structure Granulation Quality: Red, Pink Fascia Exposed: No Gehl, Shamiya ([id number]) Necrotic Amount: Medium (34-66%) Fat Layer Exposed: No Necrotic Quality: Adherent Slough Tendon Exposed: No Muscle Exposed: No Joint Exposed: No Bone Exposed: No Limited to Skin Breakdown Periwound Skin Texture Texture Color No Abnormalities Noted: No No Abnormalities Noted: No Localized Edema: Yes Temperature / Pain Moisture Temperature: No Abnormality No Abnormalities Noted: No Tenderness on Palpation: Yes Maceration: Yes Moist: Yes Wound Preparation Ulcer Cleansing: Other: soap and water, Topical Anesthetic Applied: Other: lidocaine 4%, Treatment Notes Wound #4 (Right, Circumferential Lower Leg) 1. Cleansed with: Cleanse wound with antibacterial soap and water 2. Anesthetic Topical Lidocaine 4% cream to wound bed prior to debridement 3. Peri-wound Care: Moisturizing lotion 4. Dressing Applied: Aquacel Ag 5. Secondary Dressing Applied ABD Pad 7. Secured with Tape 4 Layer Compression System - Bilateral Notes xtrasorb Electronic Signature(s) Signed: 11/03/2015 5:48:30 PM By: Alejandro Mulling Entered By: Alejandro Mulling on 11/03/2015 11:01:43 Heather Zuniga ([id number]) -------------------------------------------------------------------------------- Wound Assessment Details Patient Name: Heather Zuniga Date of Service: 11/03/2015 10:30 AM Medical Record Number: [id number] Patient Account Number: [id number] Date of Birth/Sex: 1922-12-18 (82 y.o.  Female) Treating RN: Ashok Cordia, Debi Primary Care Physician: Bjorn Pippin Other Clinician: Referring Physician: Bjorn Pippin Treating Physician/Extender: Rudene Re in Treatment: 3 Wound Status Wound Number: 5 Primary Lymphedema Etiology: Wound Location: Left Lower Leg - Circumfernential Wound Open Status: Wounding Event: Gradually Appeared Comorbid Cataracts, Glaucoma, Anemia, Date Acquired: 10/19/2015 History: Congestive Heart Failure, Weeks Of Treatment: 2 Osteoarthritis Clustered Wound: No Photos Photo Uploaded By: Alejandro Mulling on 11/03/2015 17:16:48 Wound Measurements Length: (cm) 11 Width: (cm) 14 Depth: (cm) 0.1 Area: (cm) 120.951 Volume: (cm) 12.095 % Reduction in Area: 50% % Reduction in Volume: 50% Epithelialization:  None Tunneling: No Undermining: No Wound Description Classification: Partial Thickness Wound Margin: Flat and Intact Exudate Amount: Large Exudate Type: Serosanguineous Exudate Color: red, brown Foul Odor After Cleansing: No Wound Bed Granulation Amount: Medium (34-66%) Exposed Structure Granulation Quality: Red, Pink Fascia Exposed: No Necrotic Amount: Medium (34-66%) Fat Layer Exposed: No Ibach, Marla ([id number]) Necrotic Quality: Adherent Slough Tendon Exposed: No Muscle Exposed: No Joint Exposed: No Bone Exposed: No Limited to Skin Breakdown Periwound Skin Texture Texture Color No Abnormalities Noted: No No Abnormalities Noted: No Callus: No Atrophie Blanche: No Crepitus: No Cyanosis: No Excoriation: No Ecchymosis: No Fluctuance: No Erythema: No Friable: No Hemosiderin Staining: No Induration: No Mottled: No Localized Edema: No Pallor: No Rash: No Rubor: No Scarring: No Temperature / Pain Moisture Temperature: No Abnormality No Abnormalities Noted: No Tenderness on Palpation: Yes Dry / Scaly: No Maceration: Yes Moist: Yes Wound Preparation Ulcer Cleansing: Other: soap and water, Topical  Anesthetic Applied: Other: lidocaine 4%, Treatment Notes Wound #5 (Left, Circumferential Lower Leg) 1. Cleansed with: Cleanse wound with antibacterial soap and water 2. Anesthetic Topical Lidocaine 4% cream to wound bed prior to debridement 3. Peri-wound Care: Moisturizing lotion 4. Dressing Applied: Aquacel Ag 5. Secondary Dressing Applied ABD Pad 7. Secured with Tape 4 Layer Compression System - Bilateral Notes CARENA, STREAM ([id number]) Electronic Signature(s) Signed: 11/03/2015 5:48:30 PM By: Alejandro Mulling Entered By: Alejandro Mulling on 11/03/2015 11:02:20 INDIYAH, PAONE ([id number]) -------------------------------------------------------------------------------- Vitals Details Patient Name: Heather Zuniga Date of Service: 11/03/2015 10:30 AM Medical Record Number: [id number] Patient Account Number: [id number] Date of Birth/Sex: [birth year] (82 y.o. Female) Treating RN: Ashok Cordia, Debi Primary Care Physician: Bjorn Pippin Other Clinician: Referring Physician: Bjorn Pippin Treating Physician/Extender: Rudene Re in Treatment: 3 Vital Signs Time Taken: 10:39 Temperature (F): 97.6 Height (in): 61 Pulse (bpm): 64 Weight (lbs): 210 Respiratory Rate (breaths/min): 18 Body Mass Index (BMI): 39.7 Blood Pressure (mmHg): 148/62 Reference Range: 80 - 120 mg / dl Electronic Signature(s) Signed: 11/03/2015 5:48:30 PM By: Alejandro Mulling Entered By: Alejandro Mulling on 11/03/2015 10:42:38

## 2015-11-04 NOTE — Progress Notes (Signed)
RUBYANN, LINGLE ([id number]) Visit Report for 11/03/2015 Chief Complaint Document Details Patient Name: Heather Zuniga, Heather Zuniga 11/03/2015 10:30 Date of Service: AM Medical Record [id number] Number: Patient Account Number: [id number] [birth year] (82 y.o. Treating RN: Phillis Haggis Date of Birth/Sex: Female) Other Clinician: Primary Care Physician: Bjorn Pippin Treating Zaelynn Fuchs Referring Physician: Bjorn Pippin Physician/Extender: Tania Ade in Treatment: 3 Information Obtained from: Patient Chief Complaint This 82 year old patient has had bilateral lower extremity swelling for over 3 years and now has had an open ulceration on the posterior part of the left leg for several months and a recurrent ulceration on the right lower extremity which she's had for about a week Electronic Signature(s) Signed: 11/03/2015 11:14:04 AM By: Evlyn Kanner MD, FACS Entered By: Evlyn Kanner on 11/03/2015 11:14:04 [name] ([id number]) -------------------------------------------------------------------------------- HPI Details Patient Name: Heather Zuniga, Heather Zuniga 11/03/2015 10:30 Date of Service: AM Medical Record [id number] Number: Patient Account Number: [id number] Nov 15, 1922 (82 y.o. Treating RN: Phillis Haggis Date of Birth/Sex: Female) Other Clinician: Primary Care Physician: Laroy Apple Amy Treating Evlyn Kanner Referring Physician: Bjorn Pippin Physician/Extender: Tania Ade in Treatment: 3 History of Present Illness Location: ulcerated area on the left and right posterior leg. She also has bilateral lower extremity swelling Quality: Patient reports experiencing a dull pain to affected area(s). Severity: Patient states wound are getting worse. Duration: Patient has had the wound for > 3 months prior to seeking treatment at the wound center Timing: Pain in wound is Intermittent (comes and goes Context: The wound appeared gradually over time Modifying Factors: Other treatment(s) tried include: she has had doxycycline  and a compression ace wrap applied Associated Signs and Symptoms: Patient reports having increase swelling. HPI Description: She has been seen by others in November of last year and was last seen on December 5 when she was lost to follow-up. I understand she did have a vascular workup done but we do not have these reports. She was prescribed lymphedema pumps which have not been in use as they do not have the personnel to put these on at the nursing home. 82 year old patient sent by her PCP Venora Maples for a stasis ulcer on her left lower leg which she's had for many months. she was also noticed to have recently, leg swelling and oozing. A past medical history significant for GERD, chronic kidney disease stage III,anemia, arthritis, pyuria, edema and depression. She has never been a smoker. 10/20/2015 -- was seen by Dr. Gilda Crease on 07/28/2015. ABIs were difficult to obtain but toe tracings were normal and TBI was normal. Right ABI was 0.99 and left TBI was 0.93. Duplex ultrasound of the lower extremity showed normal deep venous system superficial reflux was not present. No surgery or intervention was recommended and was asked to continue to wear compression stockings 20-30 mm variety and a lymph pump was to be advised. 11/03/2015 -- she was admitted recently between March 20 and March 24 of 2017 with altered mental status and shortness of breath and was ultimately found to have a UTI. this was treated with IV ceftriaxone and then converted to ciprofloxacin. She was also given Augmentin for a supposedly right-sided cellulitis. During her inpatient stay she was seen by the wound care nurse and appropriate dressings were done. Electronic Signature(s) Signed: 11/03/2015 11:18:27 AM By: Evlyn Kanner MD, FACS Entered By: Evlyn Kanner on 11/03/2015 11:18:26 Heather Zuniga, Heather Zuniga ([id number]) -------------------------------------------------------------------------------- Physical Exam Details Patient  Name: Heather Zuniga, Heather Zuniga 11/03/2015 10:30 Date of Service: AM Medical Record [id number] Number: Patient Account Number: [id number] [birth year] (82 y.o. Treating RN: Ashok Cordia, Debi  Date of Birth/Sex: Female) Other Clinician: Primary Care Physician: Laroy Apple, Amy Treating Evlyn Kanner Referring Physician: Bjorn Pippin Physician/Extender: Weeks in Treatment: 3 Constitutional . Pulse regular. Respirations normal and unlabored. Afebrile. . Eyes Nonicteric. Reactive to light. Ears, Nose, Mouth, and Throat Lips, teeth, and gums WNL.Marland Kitchen Moist mucosa without lesions. Neck supple and nontender. No palpable supraclavicular or cervical adenopathy. Normal sized without goiter. Respiratory WNL. No retractions.. Cardiovascular Pedal Pulses WNL. No clubbing, cyanosis or edema. Lymphatic No adneopathy. No adenopathy. No adenopathy. Musculoskeletal Adexa without tenderness or enlargement.. Digits and nails w/o clubbing, cyanosis, infection, petechiae, ischemia, or inflammatory conditions.. Integumentary (Hair, Skin) No suspicious lesions. No crepitus or fluctuance. No peri-wound warmth or erythema. No masses.Marland Kitchen Psychiatric Judgement and insight Intact.. No evidence of depression, anxiety, or agitation.. Notes the wounds are looking much better due to the fact that she is on oral antibiotics and has received elevation and bedrest during her recent admission. Electronic Signature(s) Signed: 11/03/2015 11:19:16 AM By: Evlyn Kanner MD, FACS Entered By: Evlyn Kanner on 11/03/2015 11:19:15 Heather Zuniga, Heather Zuniga ([id number]) -------------------------------------------------------------------------------- Physician Orders Details Patient Name: Heather Zuniga, Heather Zuniga 11/03/2015 10:30 Date of Service: AM Medical Record [id number] Number: Patient Account Number: [id number] [birth year] (82 y.o. Treating RN: Phillis Haggis Date of Birth/Sex: Female) Other Clinician: Primary Care Physician: Laroy Apple Amy Treating Evlyn Kanner Referring Physician: Bjorn Pippin Physician/Extender: Tania Ade in Treatment: 3 Verbal / Phone Orders: Yes ClinicianAshok Cordia, Debi Read Back and Verified: Yes Diagnosis Coding Wound Cleansing Wound #4 Right,Circumferential Lower Leg o Clean wound with Normal Saline. o Cleanse wound with mild soap and water Anesthetic Wound #4 Right,Circumferential Lower Leg o Topical Lidocaine 4% cream applied to wound bed prior to debridement Skin Barriers/Peri-Wound Care Wound #4 Right,Circumferential Lower Leg o Barrier cream o Moisturizing lotion - to heels and feet (not between toes) Primary Wound Dressing Wound #4 Right,Circumferential Lower Leg o Aquacel Ag Wound #5 Left,Circumferential Lower Leg o Aquacel Ag Secondary Dressing Wound #4 Right,Circumferential Lower Leg o ABD pad o XtraSorb Wound #5 Left,Circumferential Lower Leg o ABD pad o XtraSorb Dressing Change Frequency Wound #4 Right,Circumferential Lower Leg o Change Dressing Monday, Wednesday, Friday - when you change the dressing please wash pts leg with warm soapy water and dry well Heather Zuniga, Heather Zuniga ([id number]) Wound #5 Left,Circumferential Lower Leg o Change Dressing Monday, Wednesday, Friday - when you change the dressing please wash pts leg with warm soapy water and dry well Follow-up Appointments Wound #4 Right,Circumferential Lower Leg o Return Appointment in 1 week. Wound #5 Left,Circumferential Lower Leg o Return Appointment in 1 week. Edema Control Wound #4 Right,Circumferential Lower Leg o 4 Layer Compression System - Bilateral - anchor with unna o Elevate legs to the level of the heart and pump ankles as often as possible Wound #5 Left,Circumferential Lower Leg o 4 Layer Compression System - Bilateral - anchor with unna o Elevate legs to the level of the heart and pump ankles as often as possible Additional Orders / Instructions Wound #4 Right,Circumferential Lower  Leg o Increase protein intake. o Activity as tolerated o Other: - ok to exercise Wound #5 Left,Circumferential Lower Leg o Increase protein intake. o Activity as tolerated o Other: - ok to exercise Home Health Wound #4 Right,Circumferential Lower Leg o Continue Home Health Visits - [facility] *****Please go wrap legs and do dressing changes on Wednesdays and Fridays and pt will be seen in clinic on Mondays***** o Home Health Nurse may visit PRN to address patientos wound care needs. o FACE TO FACE ENCOUNTER: MEDICARE  and MEDICAID PATIENTS: I certify that this patient is under my care and that I had a face-to-face encounter that meets the physician face-to-face encounter requirements with this patient on this date. The encounter with the patient was in whole or in part for the following MEDICAL CONDITION: (primary reason for Home Healthcare) MEDICAL NECESSITY: I certify, that based on my findings, NURSING services are a medically necessary home health service. HOME BOUND STATUS: I certify that my clinical findings support that this patient is homebound (i.e., Due to illness or injury, pt requires aid of supportive devices such as crutches, cane, wheelchairs, walkers, the use of special transportation or the assistance of another person to leave their place of residence. There is a normal inability to leave the home and doing so requires considerable and taxing effort. Heather Zuniga, Heather Zuniga ([id number]) absences are for medical reasons / religious services and are infrequent or of short duration when for other reasons). o If current dressing causes regression in wound condition, may D/C ordered dressing product/s and apply Normal Saline Moist Dressing daily until next Wound Healing Center / Other MD appointment. Notify Wound Healing Center of regression in wound condition at [phone]. o Please direct any NON-WOUND related issues/requests for orders to patient's  Primary Care Physician Electronic Signature(s) Signed: 11/03/2015 4:52:55 PM By: Evlyn Kanner MD, FACS Signed: 11/03/2015 5:48:30 PM By: Alejandro Mulling Entered By: Alejandro Mulling on 11/03/2015 11:07:49 Heather Zuniga, Heather Zuniga ([id number]) -------------------------------------------------------------------------------- Problem List Details Patient Name: CYNTORIA, HETZEL 11/03/2015 10:30 Date of Service: AM Medical Record [id number] Number: Patient Account Number: [id number] April 09, 1923 (82 y.o. 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.211 Non-pressure chronic ulcer of right calf limited to 10/13/2015 Yes breakdown of skin E66.01 Morbid (severe) obesity due to excess calories 10/13/2015 Yes N18.3 Chronic kidney disease, stage 3 (moderate) 10/13/2015 Yes Inactive Problems Resolved Problems Electronic Signature(s) Signed: 11/03/2015 11:13:56 AM By: Evlyn Kanner MD, FACS Entered By: Evlyn Kanner on 11/03/2015 11:13:55 [name] ([id number]) -------------------------------------------------------------------------------- Progress Note Details Patient Name: CAMBRA, ADWELL 11/03/2015 10:30 Date of Service: AM Medical Record [id number] Number: Patient Account Number: [id number] 02/28/1923 (82 y.o. Treating RN: Phillis Haggis Date of Birth/Sex: Female) Other Clinician: Primary Care Physician: Bjorn Pippin Treating Delfin Squillace Referring Physician: Bjorn Pippin Physician/Extender: Tania Ade in Treatment: 3 Subjective Chief Complaint Information obtained from Patient This 82 year old patient has had bilateral lower  extremity swelling for over 3 years and now has had an open ulceration on the posterior part of the left leg for several months and a recurrent ulceration on the right lower extremity which she's had for about a week History of Present Illness (HPI) The following HPI elements were documented for the patient's wound: Location: ulcerated area on the left and right posterior leg. She also has bilateral lower extremity swelling Quality: Patient reports experiencing a dull pain to affected area(s). Severity: Patient states wound are getting worse. Duration: Patient has had the wound for > 3 months prior to seeking treatment at the wound center Timing: Pain in wound is Intermittent (comes and goes Context: The wound appeared gradually over time Modifying Factors: Other treatment(s) tried include: she has had doxycycline and a compression ace wrap applied Associated Signs and Symptoms: Patient reports having increase swelling.  She has been seen by others in November of last year and was last seen on December 5 when she was lost to follow-up. I understand she did have a vascular workup done but we do not have these reports. She was prescribed lymphedema pumps which have not been in use as they do not have the personnel to put these on at the nursing home. 82 year old patient sent by her PCP Venora Maples for a stasis ulcer on her left lower leg which she's had for many months. she was also noticed to have recently, leg swelling and oozing. A past medical history significant for GERD, chronic kidney disease stage III,anemia, arthritis, pyuria, edema and depression. She has never been a smoker. 10/20/2015 -- was seen by Dr. Gilda Crease on 07/28/2015. ABIs were difficult to obtain but toe tracings were normal and TBI was normal. Right ABI was 0.99 and left TBI was 0.93. Duplex ultrasound of the lower extremity showed normal deep venous system superficial reflux was not present. No surgery or intervention  was recommended and was asked to continue to wear compression stockings 20-30 mm variety and a lymph pump was to be advised. 11/03/2015 -- she was admitted recently between March 20 and March 24 of 2017 with altered mental status and shortness of breath and was ultimately found to have a UTI. this was treated with IV ceftriaxone and then converted to ciprofloxacin. She was also given Augmentin for a supposedly right-sided cellulitis. Heather Zuniga, Heather Zuniga ([id number]) During her inpatient stay she was seen by the wound care nurse and appropriate dressings were done. Objective Constitutional Pulse regular. Respirations normal and unlabored. Afebrile. Vitals Time Taken: 10:39 AM, Height: 61 in, Weight: 210 lbs, BMI: 39.7, Temperature: 97.6 F, Pulse: 64 bpm, Respiratory Rate: 18 breaths/min, Blood Pressure: 148/62 mmHg. Eyes Nonicteric. Reactive to light. Ears, Nose, Mouth, and Throat Lips, teeth, and gums WNL.Marland Kitchen Moist mucosa without lesions. Neck supple and nontender. No palpable supraclavicular or cervical adenopathy. Normal sized without goiter. Respiratory WNL. No retractions.. Cardiovascular Pedal Pulses WNL. No clubbing, cyanosis or edema. Lymphatic No adneopathy. No adenopathy. No adenopathy. Musculoskeletal Adexa without tenderness or enlargement.. Digits and nails w/o clubbing, cyanosis, infection, petechiae, ischemia, or inflammatory conditions.Marland Kitchen Psychiatric Judgement and insight Intact.. No evidence of depression, anxiety, or agitation.. General Notes: the wounds are looking much better due to the fact that she is on oral antibiotics and has received elevation and bedrest during her recent admission. Integumentary (Hair, Skin) No suspicious lesions. No crepitus or fluctuance. No peri-wound warmth or erythema. No masses.. Wound #4 status is Open. Original cause of wound was Gradually Appeared. The wound is located on the Right,Circumferential Lower Leg. The wound measures 15cm length  x 14cm width x 0.1cm depth; Nixon, Aubriana ([id number]) 164.934cm^2 area and 16.493cm^3 volume. The wound is limited to skin breakdown. There is no tunneling or undermining noted. There is a large amount of serosanguineous drainage noted. The wound margin is flat and intact. There is medium (34-66%) red, pink granulation within the wound bed. There is a medium (34-66%) amount of necrotic tissue within the wound bed including Adherent Slough. The periwound skin appearance exhibited: Localized Edema, Maceration, Moist. Periwound temperature was noted as No Abnormality. The periwound has tenderness on palpation. Wound #5 status is Open. Original cause of wound was Gradually Appeared. The wound is located on the Left,Circumferential Lower Leg. The wound measures 11cm length x 14cm width x 0.1cm depth; 120.951cm^2 area and 12.095cm^3 volume. The wound is limited to skin breakdown. There is no  tunneling or undermining noted. There is a large amount of serosanguineous drainage noted. The wound margin is flat and intact. There is medium (34-66%) red, pink granulation within the wound bed. There is a medium (34-66%) amount of necrotic tissue within the wound bed including Adherent Slough. The periwound skin appearance exhibited: Maceration, Moist. The periwound skin appearance did not exhibit: Callus, Crepitus, Excoriation, Fluctuance, Friable, Induration, Localized Edema, Rash, Scarring, Dry/Scaly, Atrophie Blanche, Cyanosis, Ecchymosis, Hemosiderin Staining, Mottled, Pallor, Rubor, Erythema. Periwound temperature was noted as No Abnormality. The periwound has tenderness on palpation. 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 she will be seen here back again next week Plan Wound Cleansing: Wound #4 Right,Circumferential Lower Leg: Clean wound with Normal Saline. Cleanse wound with mild soap and water Anesthetic: Heather Zuniga, Heather Zuniga ([id number]) Wound #4 Right,Circumferential Lower Leg: Topical Lidocaine 4% cream applied to wound bed prior to debridement Skin Barriers/Peri-Wound Care: Wound #4 Right,Circumferential Lower Leg: Barrier cream Moisturizing lotion - to heels and feet (not between toes) Primary Wound Dressing: Wound #4 Right,Circumferential Lower Leg: Aquacel Ag Wound #5 Left,Circumferential Lower Leg: Aquacel Ag Secondary Dressing: Wound #4 Right,Circumferential Lower Leg: ABD pad XtraSorb Wound #5 Left,Circumferential Lower Leg: ABD pad XtraSorb Dressing Change Frequency: Wound #4 Right,Circumferential Lower Leg: Change Dressing Monday, Wednesday, Friday - when you change the dressing please wash pts leg with warm soapy water and dry well Wound #5 Left,Circumferential Lower Leg: Change Dressing Monday, Wednesday, Friday - when you change the dressing please wash pts leg with warm soapy water and dry well Follow-up Appointments: Wound #4 Right,Circumferential Lower Leg: Return Appointment in 1 week. Wound #5 Left,Circumferential Lower Leg: Return Appointment in 1 week. Edema Control: Wound #4 Right,Circumferential Lower Leg: 4 Layer Compression System - Bilateral - anchor with unna Elevate legs to the level of the heart and pump ankles as often as possible Wound #5 Left,Circumferential Lower Leg: 4 Layer Compression System - Bilateral - anchor with unna Elevate legs to the level of the heart and pump ankles as often as possible Additional Orders / Instructions: Wound #4  Right,Circumferential Lower Leg: Increase protein intake. Activity as tolerated Other: - ok to exercise Wound #5 Left,Circumferential Lower Leg: Increase protein intake. Activity as tolerated Other: - ok to exercise Home Health: Wound #4 Right,Circumferential Lower Leg: Continue Home Health Visits - [facility] *****Please go wrap legs and do dressing changes on Wednesdays and Fridays and pt will be seen in clinic on Mondays***** Home Health Nurse may visit PRN to address patient s wound care needs. Heather Zuniga, Heather Zuniga ([id number]) FACE TO FACE ENCOUNTER: MEDICARE and MEDICAID PATIENTS: I certify that this patient is under my care and that I had a face-to-face encounter that meets the physician face-to-face encounter requirements with this patient on this date. The encounter with the patient was in whole or in part for the following MEDICAL CONDITION: (primary reason for Home Healthcare) MEDICAL NECESSITY: I certify, that based on my findings, NURSING services are a medically necessary home health service. HOME BOUND STATUS: I certify that my clinical findings support that this patient  is homebound (i.e., Due to illness or injury, pt requires aid of supportive devices such as crutches, cane, wheelchairs, walkers, the use of special transportation or the assistance of another person to leave their place of residence. There is a normal inability to leave the home and doing so requires considerable and taxing effort. Other absences are for medical reasons / religious services and are infrequent or of short duration when for other reasons). If current dressing causes regression in wound condition, may D/C ordered dressing product/s and apply Normal Saline Moist Dressing daily until next Wound Healing Center / Other MD appointment. Notify Wound Healing Center of regression in wound condition at [phone]. Please direct any NON-WOUND related issues/requests for orders to patient's Primary Care  Physician We will continue with oral antibiotics as prescribed by her hospitalist and will continue with her silver alginate and a 4-layer Profore wrap to be changed 3 times a week. she will be seen here back again next week Electronic Signature(s) Signed: 11/03/2015 11:20:45 AM By: Evlyn Kanner MD, FACS Entered By: Evlyn Kanner on 11/03/2015 11:20:45 [name] ([id number]) -------------------------------------------------------------------------------- SuperBill Details Patient Name: [name] Date of Service: 11/03/2015 Medical Record Number: [id number] Patient Account Number: [id number] Date of Birth/Sex: 03-25-1923 (82 y.o. 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 Physician Procedures CPT4 Code Description: [id number] - WC PHYS LEVEL 3 - EST PT ICD-10 Description Diagnosis I89.0 Lymphedema, not elsewhere classified L97.221 Non-pressure chronic ulcer of left calf limited to L97.211 Non-pressure chronic ulcer of right calf limited to  N18.3 Chronic kidney disease, stage 3 (moderate) Modifier: breakdown of sk breakdown of s Quantity: 1 in kin Electronic Signature(s) Signed: 11/03/2015 4:52:55 PM By: Evlyn Kanner MD, FACS Signed: 11/03/2015 5:48:30 PM By: Alejandro Mulling Previous Signature: 11/03/2015  11:21:05 AM Version By: Evlyn Kanner MD, FACS Entered By: Alejandro Mulling on 11/03/2015 12:01:19

## 2015-11-10 ENCOUNTER — Encounter: Payer: Medicare Other | Attending: Surgery | Admitting: Surgery

## 2015-11-10 DIAGNOSIS — L97211 Non-pressure chronic ulcer of right calf limited to breakdown of skin: Secondary | ICD-10-CM | POA: Insufficient documentation

## 2015-11-10 DIAGNOSIS — K219 Gastro-esophageal reflux disease without esophagitis: Secondary | ICD-10-CM | POA: Diagnosis not present

## 2015-11-10 DIAGNOSIS — Z6839 Body mass index (BMI) 39.0-39.9, adult: Secondary | ICD-10-CM | POA: Diagnosis not present

## 2015-11-10 DIAGNOSIS — F329 Major depressive disorder, single episode, unspecified: Secondary | ICD-10-CM | POA: Insufficient documentation

## 2015-11-10 DIAGNOSIS — L97221 Non-pressure chronic ulcer of left calf limited to breakdown of skin: Secondary | ICD-10-CM | POA: Diagnosis not present

## 2015-11-10 DIAGNOSIS — Z87440 Personal history of urinary (tract) infections: Secondary | ICD-10-CM | POA: Insufficient documentation

## 2015-11-10 DIAGNOSIS — H5441 Blindness, right eye, normal vision left eye: Secondary | ICD-10-CM | POA: Diagnosis not present

## 2015-11-10 DIAGNOSIS — I890 Lymphedema, not elsewhere classified: Secondary | ICD-10-CM | POA: Insufficient documentation

## 2015-11-10 DIAGNOSIS — I509 Heart failure, unspecified: Secondary | ICD-10-CM | POA: Insufficient documentation

## 2015-11-10 DIAGNOSIS — M1990 Unspecified osteoarthritis, unspecified site: Secondary | ICD-10-CM | POA: Insufficient documentation

## 2015-11-10 DIAGNOSIS — D649 Anemia, unspecified: Secondary | ICD-10-CM | POA: Diagnosis not present

## 2015-11-10 DIAGNOSIS — N183 Chronic kidney disease, stage 3 (moderate): Secondary | ICD-10-CM | POA: Diagnosis not present

## 2015-11-10 NOTE — Progress Notes (Addendum)
Heather Zuniga ([id number]) Visit Report for 11/10/2015 Chief Complaint Document Details Patient Name: Heather Zuniga Date of Service: 11/10/2015 8:00 AM Medical Record Number: [id number] Patient Account Number: [id number] Date of Birth/Sex: [birth year] (82 y.o. Female) Treating RN: Phillis Haggis Primary Care Physician: Bjorn Pippin Other Clinician: Referring Physician: Bjorn Pippin Treating Physician/Extender: Rudene Re in Treatment: 4 Information Obtained from: Patient Chief Complaint This 82 year old patient has had bilateral lower extremity swelling for over 3 years and now has had an open ulceration on the posterior part of the left leg for several months and a recurrent ulceration on the right lower extremity which she's had for about a week Electronic Signature(s) Signed: 11/10/2015 8:34:48 AM By: Evlyn Kanner MD, FACS Entered By: Evlyn Kanner on 11/10/2015 08:34:48 Heather Zuniga ([id number]) -------------------------------------------------------------------------------- HPI Details Patient Name: Heather Zuniga Date of Service: 11/10/2015 8:00 AM Medical Record Number: [id number] Patient Account Number: [id number] Date of Birth/Sex: 1922-09-28 (82 y.o. Female) Treating RN: Phillis Haggis Primary Care Physician: Bjorn Pippin Other Clinician: Referring Physician: Bjorn Pippin Treating Physician/Extender: Rudene Re in Treatment: 4 History of Present Illness Location: ulcerated area on the left and right posterior leg. She also has bilateral lower extremity swelling Quality: Patient reports experiencing a dull pain to affected area(s). Severity: Patient states wound are getting worse. Duration: Patient has had the wound for > 3 months prior to seeking treatment at the wound center Timing: Pain in wound is Intermittent (comes and goes Context: The wound appeared gradually over time Modifying Factors: Other treatment(s) tried include: she has had doxycycline and a  compression ace wrap applied Associated Signs and Symptoms: Patient reports having increase swelling. HPI Description: She has been seen by others in November of last year and was last seen on December 5 when she was lost to follow-up. I understand she did have a vascular workup done but we do not have these reports. She was prescribed lymphedema pumps which have not been in use as they do not have the personnel to put these on at the nursing home. 82 year old patient sent by her PCP Venora Maples for a stasis ulcer on her left lower leg which she's had for many months. she was also noticed to have recently, leg swelling and oozing. A past medical history significant for GERD, chronic kidney disease stage III,anemia, arthritis, pyuria, edema and depression. She has never been a smoker. 10/20/2015 -- was seen by Dr. Gilda Crease on 07/28/2015. ABIs were difficult to obtain but toe tracings were normal and TBI was normal. Right ABI was 0.99 and left TBI was 0.93. Duplex ultrasound of the lower extremity showed normal deep venous system superficial reflux was not present. No surgery or intervention was recommended and was asked to continue to wear compression stockings 20-30 mm variety and a lymph pump was to be advised. 11/03/2015 -- she was admitted recently between March 20 and March 24 of 2017 with altered mental status and shortness of breath and was ultimately found to have a UTI. this was treated with IV ceftriaxone and then converted to ciprofloxacin. She was also given Augmentin for a supposedly right-sided cellulitis. During her inpatient stay she was seen by the wound care nurse and appropriate dressings were done. Electronic Signature(s) Signed: 11/10/2015 8:34:52 AM By: Evlyn Kanner MD, FACS Entered By: Evlyn Kanner on 11/10/2015 08:34:52 Heather Zuniga, Heather Zuniga ([id number]) -------------------------------------------------------------------------------- Physical Exam Details Patient Name:  Heather Zuniga Date of Service: 11/10/2015 8:00 AM Medical Record Number: [id number] Patient Account Number: [id number] Date of Birth/Sex: 12-01-1922 (82 y.o. Female)  Treating RN: Phillis Haggis Primary Care Physician: Bjorn Pippin Other Clinician: Referring Physician: Bjorn Pippin Treating Physician/Extender: Rudene Re in Treatment: 4 Constitutional . Pulse regular. Respirations normal and unlabored. Afebrile. . Eyes Nonicteric. Reactive to light. Ears, Nose, Mouth, and Throat Lips, teeth, and gums WNL.Marland Kitchen Moist mucosa without lesions. Neck supple and nontender. No palpable supraclavicular or cervical adenopathy. Normal sized without goiter. Respiratory WNL. No retractions.. Cardiovascular Pedal Pulses WNL. No clubbing, cyanosis or edema. Chest Breasts symmetical and no nipple discharge.. Breast tissue WNL, no masses, lumps, or tenderness.. Lymphatic No adneopathy. No adenopathy. No adenopathy. Musculoskeletal Adexa without tenderness or enlargement.. Digits and nails w/o clubbing, cyanosis, infection, petechiae, ischemia, or inflammatory conditions.. Integumentary (Hair, Skin) No suspicious lesions. No crepitus or fluctuance. No peri-wound warmth or erythema. No masses.Marland Kitchen Psychiatric Judgement and insight Intact.. No evidence of depression, anxiety, or agitation.. Notes The edema has gone down completely and she has no open ulceration whatsoever. Wounds have all healed. Electronic Signature(s) Signed: 11/10/2015 8:39:37 AM By: Evlyn Kanner MD, FACS Entered By: Evlyn Kanner on 11/10/2015 08:39:36 Heather Zuniga, Heather Zuniga ([id number]) -------------------------------------------------------------------------------- Physician Orders Details Patient Name: Heather Zuniga Date of Service: 11/10/2015 8:00 AM Medical Record Number: [id number] Patient Account Number: [id number] Date of Birth/Sex: 04/27/1923 (82 y.o. 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 May discharge your services. Wounds are healed. Discharge From [facility] Services o Discharge from Wound Care Center - ******PUT ON COMPRESSION STOCKINGS AS SOON AS PT ARRIVES BACK TO HOME*****Wear compression stockings daily (as soon as she gets up) and remove at night as long before bed as pts legs are elevated (please make sure her legs are elevated while she is sleeping at all times. Pt legs have to be elevated when she is sitting in her chair resting or sleeping. Please call if you need anything. Electronic Signature(s) Signed: 11/10/2015 3:32:20 PM By: Evlyn Kanner MD, FACS Signed: 11/11/2015 4:28:40 PM By: Alejandro Mulling Entered By: Alejandro Mulling on 11/10/2015 08:45:51 Heather Zuniga, Heather Zuniga ([id number]) -------------------------------------------------------------------------------- Problem List Details Patient Name: Heather Zuniga Date of Service: 11/10/2015 8:00 AM Medical Record Number: [id number] Patient Account Number: [id number] Date of Birth/Sex: June 11, 1923 (82 y.o. 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 of left calf limited to 10/13/2015 Yes breakdown of skin L97.211 Non-pressure chronic ulcer of right calf limited to 10/13/2015 Yes breakdown of skin E66.01 Morbid (severe) obesity due to excess calories 10/13/2015 Yes N18.3 Chronic kidney disease, stage 3 (moderate) 10/13/2015 Yes Inactive Problems Resolved Problems Electronic Signature(s) Signed: 11/10/2015 8:34:40 AM By: Evlyn Kanner MD, FACS Entered By: Evlyn Kanner on 11/10/2015 08:34:40 Heather Zuniga ([id number]) -------------------------------------------------------------------------------- Progress Note Details Patient Name: Heather Zuniga Date of Service: 11/10/2015 8:00 AM Medical Record Number: [id number] Patient Account Number: [id number] Date of  Birth/Sex: 02/28/1923 (82 y.o. Female) Treating RN: Ashok Cordia, Debi Primary Care Physician: Bjorn Pippin Other Clinician: Referring Physician: Bjorn Pippin Treating Physician/Extender: Rudene Re in Treatment: 4 Subjective Chief Complaint Information obtained from Patient This 82 year old patient has had bilateral lower extremity swelling for over 3 years and now has had an open ulceration on the posterior part of the left leg for several months and a recurrent ulceration on the right lower extremity which she's had for about a week History of Present Illness (HPI) The following HPI elements were documented for the patient's wound: Location: ulcerated area on the left and right posterior leg. She also has bilateral lower extremity swelling Quality: Patient reports experiencing a dull pain to affected area(s). Severity: Patient states wound are getting worse. Duration: Patient has had the wound for > 3 months prior to seeking treatment at the wound center Timing: Pain in wound is Intermittent (comes and goes Context: The wound appeared gradually over time Modifying Factors: Other treatment(s) tried include: she has had  doxycycline and a compression ace wrap applied Associated Signs and Symptoms: Patient reports having increase swelling. She has been seen by others in November of last year and was last seen on December 5 when she was lost to follow-up. I understand she did have a vascular workup done but we do not have these reports. She was prescribed lymphedema pumps which have not been in use as they do not have the personnel to put these on at the nursing home. 82 year old patient sent by her PCP Venora Maples for a stasis ulcer on her left lower leg which she's had for many months. she was also noticed to have recently, leg swelling and oozing. A past medical history significant for GERD, chronic kidney disease stage III,anemia, arthritis, pyuria, edema and depression. She has never been a smoker. 10/20/2015 -- was seen by Dr. Gilda Crease on 07/28/2015. ABIs were difficult to obtain but toe tracings were normal and TBI was normal. Right ABI was 0.99 and left TBI was 0.93. Duplex ultrasound of the lower extremity showed normal deep venous system superficial reflux was not present. No surgery or intervention was recommended and was asked to continue to wear compression stockings 20-30 mm variety and a lymph pump was to be advised. 11/03/2015 -- she was admitted recently between March 20 and March 24 of 2017 with altered mental status and shortness of breath and was ultimately found to have a UTI. this was treated with IV ceftriaxone and then converted to ciprofloxacin. She was also given Augmentin for a supposedly right-sided cellulitis. During her inpatient stay she was seen by the wound care nurse and appropriate dressings were done. Heather Zuniga, Heather Zuniga ([id number]) Objective Constitutional Pulse regular. Respirations normal and unlabored. Afebrile. Vitals Time Taken: 8:17 AM, Height: 61 in, Weight: 210 lbs, BMI: 39.7, Temperature: 97.6 F, Pulse: 67 bpm, Respiratory Rate: 18 breaths/min, Blood Pressure: 151/58  mmHg. Eyes Nonicteric. Reactive to light. Ears, Nose, Mouth, and Throat Lips, teeth, and gums WNL.Marland Kitchen Moist mucosa without lesions. Neck supple and nontender. No palpable supraclavicular or cervical adenopathy. Normal sized without goiter. Respiratory WNL. No retractions.. Cardiovascular Pedal Pulses WNL. No clubbing, cyanosis or edema. Chest Breasts symmetical and no nipple discharge.. Breast tissue WNL, no masses, lumps, or tenderness.. Lymphatic No adneopathy. No adenopathy. No adenopathy. Musculoskeletal Adexa without tenderness or enlargement.. Digits and nails w/o clubbing, cyanosis, infection, petechiae, ischemia, or inflammatory conditions.Marland Kitchen Psychiatric Judgement and insight Intact.. No evidence of depression, anxiety, or agitation.. General Notes: The edema has gone  down completely and she has no open ulceration whatsoever. Wounds have all healed. Integumentary (Hair, Skin) No suspicious lesions. No crepitus or fluctuance. No peri-wound warmth or erythema. No masses.Marland Kitchen Heather Zuniga, Heather Zuniga ([id number]) Wound #4 status is Healed - Epithelialized. Original cause of wound was Gradually Appeared. The wound is located on the Right,Circumferential Lower Leg. The wound measures 0cm length x 0cm width x 0cm depth; 0cm^2 area and 0cm^3 volume. Wound #5 status is Healed - Epithelialized. Original cause of wound was Gradually Appeared. The wound is located on the Left,Circumferential Lower Leg. The wound measures 0cm length x 0cm width x 0cm depth; 0cm^2 area and 0cm^3 volume. Assessment Active Problems ICD-10 I89.0 - Lymphedema, not elsewhere classified L97.221 - Non-pressure chronic ulcer of left calf limited to breakdown of skin L97.211 - Non-pressure chronic ulcer of right calf limited to breakdown of skin E66.01 - Morbid (severe) obesity due to excess calories N18.3 - Chronic kidney disease, stage 3 (moderate) Having completely healed her wounds she does not require compression wraps  to be applied today but I have spoken to her caregiver and the patient and recommended elevation, exercise and compression stockings of the 20-30 mm variety to be wound all the time except when she is in bed. The vascular surgeon was also going to prescribe lymph pumps for her and if this is available she should use them twice a day for an hour each. She is discharged on the wound care services and be seen back as needed. Plan Home Health: D/C Home Health Services - Pt is being discharged from wound care center. May discharge your services. Wounds are healed. Discharge From [facility] Services: Discharge from Wound Care Center - ******PUT ON COMPRESSION STOCKINGS AS SOON AS PT ARRIVES BACK TO HOME*****Wear compression stockings daily (as soon as she gets up) and remove at night as long before bed as pts legs are elevated (please make sure her legs are elevated while she is sleeping at all times. Pt legs have to be elevated when she is sitting in her chair resting or sleeping. Please call if you need anything. Heather Zuniga, Heather Zuniga ([id number]) Having completely healed her wounds she does not require compression wraps to be applied today but I have spoken to her caregiver and the patient and recommended elevation, exercise and compression stockings of the 20-30 mm variety to be wound all the time except when she is in bed. The vascular surgeon was also going to prescribe lymph pumps for her and if this is available she should use them twice a day for an hour each. She is discharged on the wound care services and be seen back as needed. Electronic Signature(s) Signed: 11/10/2015 3:33:31 PM By: Evlyn Kanner MD, FACS Previous Signature: 11/10/2015 8:41:36 AM Version By: Evlyn Kanner MD, FACS Entered By: Evlyn Kanner on 11/10/2015 15:33:30 Heather Zuniga, Heather Zuniga ([id number]) -------------------------------------------------------------------------------- SuperBill Details Patient Name: Heather Zuniga Date of Service:  11/10/2015 Medical Record Number: [id number] Patient Account Number: [id number] Date of Birth/Sex: 11-22-1922 (82 y.o. 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 of skin E66.01 Morbid (severe) obesity due to excess calories N18.3 Chronic kidney disease, stage 3 (moderate) Facility Procedures CPT4 Code: [id number] Description: 99213 - WOUND CARE VISIT-LEV 3 EST PT Modifier:  Quantity: 1 Physician Procedures CPT4 Code Description: [id number] 99213 - WC PHYS LEVEL 3 - EST PT ICD-10 Description Diagnosis I89.0 Lymphedema, not elsewhere classified L97.221 Non-pressure chronic ulcer of left calf limited to L97.211 Non-pressure chronic ulcer of right calf limited to Modifier: breakdown of ski breakdown of sk Quantity: 1 n in Electronic Signature(s) Signed: 11/10/2015 3:32:20 PM By: Evlyn Kanner MD, FACS Signed: 11/11/2015 4:28:40 PM By: Alejandro Mulling Previous Signature: 11/10/2015 8:41:56 AM Version By: Evlyn Kanner MD, FACS Entered By: Alejandro Mulling on 11/10/2015 08:58:00

## 2015-11-12 NOTE — Progress Notes (Signed)
Heather Zuniga, Heather Zuniga ([id number]) Visit Report for 11/10/2015 Arrival Information Details Patient Name: Heather Zuniga, Heather Zuniga Date of Service: 11/10/2015 8:00 AM Medical Record Number: [id number] Patient Account Number: [id number] Date of Birth/Sex: 13-Jul-1923 (82 y.o. Female) Treating RN: Phillis Haggis Primary Care Physician: Bjorn Pippin Other Clinician: Referring Physician: Bjorn Pippin Treating Physician/Extender: Rudene Re in Treatment: 4 Visit Information History Since Last Visit All ordered tests and consults were completed: No Patient Arrived: Wheel Chair Added or deleted any medications: No Arrival Time: 08:16 Any new allergies or adverse reactions: No Accompanied By: caregiver Had a fall or experienced change in No Transfer Assistance: EasyPivot activities of daily living that may affect Patient Lift risk of falls: Patient Identification Verified: Yes Signs or symptoms of abuse/neglect since last No Secondary Verification Process Yes visito Completed: Hospitalized since last visit: No Patient Requires Transmission- No Pain Present Now: No Based Precautions: Patient Has Alerts: No Electronic Signature(s) Signed: 11/11/2015 4:28:40 PM By: Alejandro Mulling Entered By: Alejandro Mulling on 11/10/2015 08:16:40 Heather Zuniga ([id number]) -------------------------------------------------------------------------------- Clinic Level of Care Assessment Details Patient Name: Heather Zuniga Date of Service: 11/10/2015 8:00 AM Medical Record Number: [id number] Patient Account Number: [id number] Date of Birth/Sex: 1922/11/27 (82 y.o. Female) Treating RN: Ashok Cordia, Debi Primary Care Physician: Bjorn Pippin Other Clinician: Referring Physician: Bjorn Pippin Treating Physician/Extender: Rudene Re in Treatment: 4 Clinic Level of Care Assessment Items TOOL 4 Quantity Score []  - Use when only an EandM is performed on FOLLOW-UP visit 0 ASSESSMENTS - Nursing Assessment / Reassessment []  -  Reassessment of Co-morbidities (includes updates in patient status) 0 X - Reassessment of Adherence to Treatment Plan 1 5 ASSESSMENTS - Wound and Skin Assessment / Reassessment []  - Simple Wound Assessment / Reassessment - one wound 0 X - Complex Wound Assessment / Reassessment - multiple wounds 2 5 []  - Dermatologic / Skin Assessment (not related to wound area) 0 ASSESSMENTS - Focused Assessment X - Circumferential Edema Measurements - multi extremities 2 5 []  - Nutritional Assessment / Counseling / Intervention 0 []  - Lower Extremity Assessment (monofilament, tuning fork, pulses) 0 []  - Peripheral Arterial Disease Assessment (using hand held doppler) 0 ASSESSMENTS - Ostomy and/or Continence Assessment and Care []  - Incontinence Assessment and Management 0 []  - Ostomy Care Assessment and Management (repouching, etc.) 0 PROCESS - Coordination of Care []  - Simple Patient / Family Education for ongoing care 0 X - Complex (extensive) Patient / Family Education for ongoing care 1 20 []  - Staff obtains , Records, Test Results / Process Orders 0 X - Staff telephones HHA, Nursing Homes / Clarify orders / etc 1 10 []  - Routine Transfer to another Facility (non-emergent condition) 0 Winfield, Takisha ( ) []  - Routine Hospital Admission (non-emergent condition) 0 []  - New Admissions / / Ordering NPWT, Apligraf, etc. 0 []  - Emergency Hospital Admission (emergent condition) 0 []  - Simple Discharge Coordination 0 X - Complex (extensive) Discharge Coordination 1 15 PROCESS - Special Needs []  - Pediatric / Minor Patient Management 0 []  - Isolation Patient Management 0 []  - Hearing / Language / Visual special needs 0 []  - Assessment of Community assistance (transportation, D/C planning, etc.) 0 []  - Additional assistance / Altered mentation 0 []  - Support Surface(s) Assessment (bed, cushion, seat, etc.) 0 INTERVENTIONS - Wound Cleansing / Measurement []  - Simple  Wound Cleansing - one wound 0 X - Complex Wound Cleansing - multiple wounds 2 5 X - Wound Imaging (photographs - any number of wounds) 1 5 []  -  Wound Tracing (instead of photographs) 0 []  - Simple Wound Measurement - one wound 0 []  - Complex Wound Measurement - multiple wounds 0 INTERVENTIONS - Wound Dressings []  - Small Wound Dressing one or multiple wounds 0 []  - Medium Wound Dressing one or multiple wounds 0 []  - Large Wound Dressing one or multiple wounds 0 []  - Application of Medications - topical 0 []  - Application of Medications - injection 0 INTERVENTIONS - Miscellaneous []  - External ear exam 0 Lanagan, Shealynn ( ) []  - Specimen Collection (cultures, biopsies, blood, body fluids, etc.) 0 []  - Specimen(s) / Culture(s) sent or taken to Lab for analysis 0 []  - Patient Transfer (multiple staff / Lift / Similar devices) 0 []  - Simple Staple / Suture removal (25 or less) 0 []  - Complex Staple / Suture removal (26 or more) 0 []  - Hypo / Hyperglycemic Management (close monitor of Blood Glucose) 0 []  - Ankle / Brachial Index (ABI) - do not check if billed separately 0 X - Vital Signs 1 5 Has the patient been seen at the hospital within the last three years: Yes Total Score: 90 Level Of Care: New/Established - Level 3 Electronic Signature(s) Signed: 11/11/2015 4:28:40 PM By: Entered By: on 11/10/2015 08:57:06 ( ) -------------------------------------------------------------------------------- Encounter Discharge Information Details Patient Name: [id number] Date of Service: 11/10/2015 8:00 AM Medical Record Number: Patient Account Number: Date of Birth/Sex: [birth year] (82 y.o. Female) Treating RN: Primary Care Physician: Other Clinician: Referring Physician: Treating Physician/Extender: 01/11/2016 in Treatment: 4 Encounter Discharge Information  Items Discharge Pain Level: 0 Discharge Condition: Stable Ambulatory Status: Wheelchair Discharge Destination: Nursing Home Transportation: Other Accompanied By: caregiver Schedule Follow-up Appointment: No Medication Reconciliation completed Yes and provided to Patient/Care Kendelle Schweers: Provided on Clinical Summary of Care: 11/10/2015 Form Type Recipient Paper Patient HB Electronic Signature(s) Signed: 11/10/2015 8:49:50 AM By: 01/10/2016 Entered By: Heather Zuniga on 11/10/2015 08:49:50 Heather Zuniga (01/10/2016) -------------------------------------------------------------------------------- Lower Extremity Assessment Details Patient Name: [id number] Date of Service: 11/10/2015 8:00 AM Medical Record Number: 03/05/[id number] Patient Account Number: [id number] Date of Birth/Sex: March 30, 1923 (82 y.o. Female) Treating RN: Bjorn Pippin, Debi Primary Care Physician: Rudene Re Other Clinician: Referring Physician: 01/10/2016 Treating Physician/Extender: 01/10/2016 in Treatment: 4 Edema Assessment Assessed: [Left: No] [Right: No] E[Left: dema] [Right: :] Calf Left: Right: Point of Measurement: 30 cm From Medial Instep 42 cm 42.5 cm Ankle Left: Right: Point of Measurement: 9 cm From Medial Instep 27 cm 28.5 cm Vascular Assessment Pulses: Posterior Tibial Dorsalis Pedis Palpable: [Left:Yes] [Right:Yes] Extremity colors, hair growth, and conditions: Extremity Color: [Left:Hyperpigmented] [Right:Hyperpigmented] Temperature of Extremity: [Left:Warm] [Right:Warm] Capillary Refill: [Left:< 3 seconds] [Right:< 3 seconds] Toe Nail Assessment Left: Right: Thick: Yes Yes Discolored: Yes Yes Deformed: Yes Yes Improper Length and Hygiene: Yes Yes Electronic Signature(s) Signed: 11/11/2015 4:28:40 PM By: Gwenlyn Perking Entered By: 01/10/2016 on 11/10/2015 08:30:17 [id number] (Heather Zuniga) -------------------------------------------------------------------------------- Multi  Wound Chart Details Patient Name: 01/10/2016 Date of Service: 11/10/2015 8:00 AM Medical Record Number: [id number] Patient Account Number: 03/05/1923 Date of Birth/Sex: Jan 31, 1923 (82 y.o. Female) Treating RN: Bjorn Pippin Primary Care Physician: Bjorn Pippin Other Clinician: Referring Physician: Rudene Re Treating Physician/Extender: 01/11/2016 in Treatment: 4 Vital Signs Height(in): 61 Pulse(bpm): 67 Weight(lbs): 210 Blood Pressure 151/58 (mmHg): Body Mass Index(BMI): 40 Temperature(F): 97.6 Respiratory Rate 18 (breaths/min): Wound Assessments Treatment Notes Electronic Signature(s) Signed: 11/11/2015 4:28:40 PM By: Alejandro Mulling Entered By:  Alejandro Mulling on 11/10/2015 08:36:31 PADEN, Heather Zuniga ([id number]) -------------------------------------------------------------------------------- Multi-Disciplinary Care Plan Details Patient Name: Heather Zuniga, Heather Zuniga Date of Service: 11/10/2015 8:00 AM Medical Record Number: [id number] Patient Account Number: [id number] Date of Birth/Sex: October 03, 1922 (82 y.o. Female) Treating RN: Phillis Haggis Primary Care Physician: Bjorn Pippin Other Clinician: Referring Physician: Bjorn Pippin Treating Physician/Extender: Rudene Re in Treatment: 4 Active Inactive Electronic Signature(s) Signed: 11/11/2015 4:28:40 PM By: Alejandro Mulling Entered By: Alejandro Mulling on 11/10/2015 08:56:04 Heather Zuniga ([id number]) -------------------------------------------------------------------------------- Pain Assessment Details Patient Name: Heather Zuniga Date of Service: 11/10/2015 8:00 AM Medical Record Number: [id number] Patient Account Number: [id number] Date of Birth/Sex: 19-Jul-1923 (82 y.o. Female) Treating RN: Phillis Haggis Primary Care Physician: Bjorn Pippin Other Clinician: Referring Physician: Bjorn Pippin Treating Physician/Extender: Rudene Re in Treatment: 4 Active Problems Location of Pain Severity and Description of  Pain Patient Has Paino No Site Locations Pain Management and Medication Current Pain Management: Electronic Signature(s) Signed: 11/11/2015 4:28:40 PM By: Alejandro Mulling Entered By: Alejandro Mulling on 11/10/2015 08:16:46 Heather Zuniga ([id number]) -------------------------------------------------------------------------------- Patient/Caregiver Education Details Patient Name: Heather Zuniga Date of Service: 11/10/2015 8:00 AM Medical Record Number: [id number] Patient Account Number: [id number] Date of Birth/Gender: 1922/11/13 (82 y.o. Female) Treating RN: Phillis Haggis Primary Care Physician: Bjorn Pippin Other Clinician: Referring Physician: Bjorn Pippin Treating Physician/Extender: Rudene Re in Treatment: 4 Education Assessment Education Provided To: Caregiver Education Topics Provided Wound/Skin Impairment: Handouts: Other: PUT ON COMPRESSION STOCKINGS AS SOON AS PT ARRIVES BACK TO HOME Methods: Explain/Verbal Responses: State content correctly Electronic Signature(s) Signed: 11/11/2015 4:28:40 PM By: Alejandro Mulling Entered By: Alejandro Mulling on 11/10/2015 08:43:17 Heather Zuniga ([id number]) -------------------------------------------------------------------------------- Wound Assessment Details Patient Name: Heather Zuniga Date of Service: 11/10/2015 8:00 AM Medical Record Number: [id number] Patient Account Number: [id number] Date of Birth/Sex: 04-Dec-1922 (82 y.o. Female) Treating RN: Ashok Cordia, Debi Primary Care Physician: Bjorn Pippin Other Clinician: Referring Physician: Bjorn Pippin Treating Physician/Extender: Rudene Re in Treatment: 4 Wound Status Wound Number: 4 Primary Etiology: Lymphedema Wound Location: Right, Circumferential Lower Secondary Etiology: Venous Leg Ulcer Leg Wound Status: Healed - Epithelialized Wounding Event: Gradually Appeared Date Acquired: 09/29/2015 Weeks Of Treatment: 4 Clustered Wound: No Photos Photo Uploaded By:  Alejandro Mulling on 11/11/2015 14:56:20 Wound Measurements Length: (cm) 0 Width: (cm) 0 Depth: (cm) 0 Area: (cm) 0 Volume: (cm) 0 % Reduction in Area: 100% % Reduction in Volume: 100% Wound Description Classification: Partial Thickness Periwound Skin Texture Texture Color No Abnormalities Noted: No No Abnormalities Noted: No Moisture No Abnormalities Noted: No Electronic Signature(s) Signed: 11/11/2015 4:28:40 PM By: Sharen Hones, Myriam Jacobson ([id number]) Entered By: Alejandro Mulling on 11/10/2015 08:42:40 Heather Zuniga ([id number]) -------------------------------------------------------------------------------- Wound Assessment Details Patient Name: Heather Zuniga Date of Service: 11/10/2015 8:00 AM Medical Record Number: [id number] Patient Account Number: [id number] Date of Birth/Sex: 11/19/1922 (82 y.o. Female) Treating RN: Ashok Cordia, Debi Primary Care Physician: Bjorn Pippin Other Clinician: Referring Physician: Bjorn Pippin Treating Physician/Extender: Rudene Re in Treatment: 4 Wound Status Wound Number: 5 Primary Etiology: Lymphedema Wound Location: Left, Circumferential Lower Leg Wound Status: Healed - Epithelialized Wounding Event: Gradually Appeared Date Acquired: 10/19/2015 Weeks Of Treatment: 3 Clustered Wound: No Photos Photo Uploaded By: Alejandro Mulling on 11/11/2015 14:56:30 Wound Measurements Length: (cm) 0 Width: (cm) 0 Depth: (cm) 0 Area: (cm) 0 Volume: (cm) 0 % Reduction in Area: 100% % Reduction in Volume: 100% Wound Description Classification: Partial Thickness Periwound Skin Texture Texture Color No Abnormalities Noted: No No Abnormalities Noted: No Moisture No Abnormalities Noted: No Electronic Signature(s) Signed: 11/11/2015 4:28:40  PM By: Sharen Hones, Myriam Jacobson ([id number]) Entered By: Alejandro Mulling on 11/10/2015 08:42:40 Heather Zuniga  ([id number]) -------------------------------------------------------------------------------- Vitals Details Patient Name: Heather Zuniga Date of Service: 11/10/2015 8:00 AM Medical Record Number: [id number] Patient Account Number: [id number] Date of Birth/Sex: 1923-03-16 (82 y.o. Female) Treating RN: Ashok Cordia, Debi Primary Care Physician: Bjorn Pippin Other Clinician: Referring Physician: Bjorn Pippin Treating Physician/Extender: Rudene Re in Treatment: 4 Vital Signs Time Taken: 08:17 Temperature (F): 97.6 Height (in): 61 Pulse (bpm): 67 Weight (lbs): 210 Respiratory Rate (breaths/min): 18 Body Mass Index (BMI): 39.7 Blood Pressure (mmHg): 151/58 Reference Range: 80 - 120 mg / dl Electronic Signature(s) Signed: 11/11/2015 4:28:40 PM By: Alejandro Mulling Entered By: Alejandro Mulling on 11/10/2015 08:17:10

## 2016-02-08 ENCOUNTER — Encounter: Payer: Self-pay | Admitting: Emergency Medicine

## 2016-02-08 ENCOUNTER — Emergency Department
Admission: EM | Admit: 2016-02-08 | Discharge: 2016-02-09 | Disposition: A | Discharge status: Home / Self Care | Payer: Medicare Other | Attending: Emergency Medicine | Admitting: Emergency Medicine

## 2016-02-08 DIAGNOSIS — F329 Major depressive disorder, single episode, unspecified: Secondary | ICD-10-CM | POA: Insufficient documentation

## 2016-02-08 DIAGNOSIS — N183 Chronic kidney disease, stage 3 (moderate): Secondary | ICD-10-CM | POA: Diagnosis not present

## 2016-02-08 DIAGNOSIS — Z79899 Other long term (current) drug therapy: Secondary | ICD-10-CM | POA: Insufficient documentation

## 2016-02-08 DIAGNOSIS — M1990 Unspecified osteoarthritis, unspecified site: Secondary | ICD-10-CM | POA: Diagnosis not present

## 2016-02-08 DIAGNOSIS — R197 Diarrhea, unspecified: Secondary | ICD-10-CM | POA: Insufficient documentation

## 2016-02-08 DIAGNOSIS — Z792 Long term (current) use of antibiotics: Secondary | ICD-10-CM | POA: Diagnosis not present

## 2016-02-08 DIAGNOSIS — R195 Other fecal abnormalities: Secondary | ICD-10-CM

## 2016-02-08 LAB — COMPREHENSIVE METABOLIC PANEL
ALK PHOS: 106 U/L (ref 38–126)
ALT: 14 U/L (ref 14–54)
ANION GAP: 10 (ref 5–15)
AST: 30 U/L (ref 15–41)
Albumin: 4.3 g/dL (ref 3.5–5.0)
BILIRUBIN TOTAL: 0.8 mg/dL (ref 0.3–1.2)
BUN: 68 mg/dL — ABNORMAL HIGH (ref 6–20)
CALCIUM: 8.9 mg/dL (ref 8.9–10.3)
CO2: 26 mmol/L (ref 22–32)
Chloride: 103 mmol/L (ref 101–111)
Creatinine, Ser: 1.67 mg/dL — ABNORMAL HIGH (ref 0.44–1.00)
GFR calc non Af Amer: 26 mL/min — ABNORMAL LOW (ref >60)
GFR, EST AFRICAN AMERICAN: 30 mL/min — AB (ref >60)
Glucose, Bld: 96 mg/dL (ref 65–99)
POTASSIUM: 3.8 mmol/L (ref 3.5–5.1)
SODIUM: 139 mmol/L (ref 135–145)
TOTAL PROTEIN: 8.6 g/dL — AB (ref 6.5–8.1)

## 2016-02-08 LAB — C DIFFICILE QUICK SCREEN W PCR REFLEX
C DIFFICILE (CDIFF) INTERP: NEGATIVE
C DIFFICILE (CDIFF) TOXIN: NEGATIVE
C Diff antigen: NEGATIVE

## 2016-02-08 LAB — CBC WITH DIFFERENTIAL/PLATELET
BASOS PCT: 1 %
Basophils Absolute: 0 10*3/uL (ref 0–0.1)
EOS ABS: 0.1 10*3/uL (ref 0–0.7)
Eosinophils Relative: 1 %
HEMATOCRIT: 30.5 % — AB (ref 35.0–47.0)
HEMOGLOBIN: 10.2 g/dL — AB (ref 12.0–16.0)
LYMPHS ABS: 1.5 10*3/uL (ref 1.0–3.6)
Lymphocytes Relative: 25 %
MCH: 34.3 pg — AB (ref 26.0–34.0)
MCHC: 33.5 g/dL (ref 32.0–36.0)
MCV: 102.1 fL — ABNORMAL HIGH (ref 80.0–100.0)
MONO ABS: 0.5 10*3/uL (ref 0.2–0.9)
MONOS PCT: 8 %
NEUTROS PCT: 65 %
Neutro Abs: 4.1 10*3/uL (ref 1.4–6.5)
Platelets: 207 10*3/uL (ref 150–440)
RBC: 2.98 MIL/uL — ABNORMAL LOW (ref 3.80–5.20)
RDW: 13.2 % (ref 11.5–14.5)
WBC: 6.3 10*3/uL (ref 3.6–11.0)

## 2016-02-08 LAB — LIPASE, BLOOD: Lipase: 16 U/L (ref 11–51)

## 2016-02-08 LAB — OCCULT BLOOD X 1 CARD TO LAB, STOOL: Fecal Occult Bld: NEGATIVE

## 2016-02-08 MED ORDER — SODIUM CHLORIDE 0.9 % IV BOLUS (SEPSIS)
500.0000 mL | Freq: Once | INTRAVENOUS | Status: AC
  Administered 2016-02-08: 500 mL via INTRAVENOUS

## 2016-02-08 NOTE — ED Notes (Signed)
Pt resting in bed with her eyes closed. NAD noted at this time. Respirations even and unlabored, pt awakens with mild stimuli. Denies needs at this time. Will continue to monitor for further patient needs.

## 2016-02-08 NOTE — ED Notes (Signed)
Pt assisted to the bathroom at this time. Pt tolerated well. NAD noted. Pt assisted back to bed at this time. Pt's daughter to bedside.

## 2016-02-08 NOTE — ED Notes (Addendum)
Discussed IV options with patient, AC placement preferred. 

## 2016-02-08 NOTE — ED Notes (Signed)
Per EMS patient comes from group home "[facility]" and has had diarrhea for the last 2 days straight nonstop.  One other resident there has confirmed c. Diff.  Within one hour of consuming food, she has diarrhea that according to EMS has a bitter smell to it.  Patient is AOx4 and in in NAD.

## 2016-02-08 NOTE — ED Notes (Signed)
Report from [place], [employer]ity fundraiser. Caregiver at bedside. Denies need for anything at this time.

## 2016-02-08 NOTE — ED Provider Notes (Signed)
[facility] Emergency Department Provider Note  ____________________________________________  Time seen: Approximately 8:17 PM  I have reviewed the triage vital signs and the nursing notes.   HISTORY  Chief Complaint Diarrhea    HPI Heather Zuniga is a 82 y.o. female presents for evaluation of diarrhea.  Per EMS staff at her assisted living reports she was having diarrhea for the last 2 days, fairly frequently and that they're concerned that other residents in the same facility might have C. difficile.  The patient reports to me that she was having a bowel movement when EMS arrived, she reports that she is not having frequent diarrhea. She reports that she feels fine right now, and she does not feel as though she can give a sample. She reports that she does not have any nausea, vomiting, fever or pain. She reports she is not having any concerns at the present time, and wishes to go back to her assisted living.     Past Medical History  Diagnosis Date  . GERD (gastroesophageal reflux disease)   . Anemia   . Arthritis   . Pyuria   . Edema   . Depression   . CKD (chronic kidney disease) stage 3, GFR 30-59 ml/min     Patient Active Problem List   Diagnosis Date Noted  . UTI (lower urinary tract infection) 10/27/2015  . GERD (gastroesophageal reflux disease) 06/09/2015  . Obesity 06/09/2015  . Arthritis 06/09/2015  . Edema 06/09/2015  . Stasis leg ulcer (HCC) 06/09/2015  . Absolute anemia 03/23/2014  . Chronic kidney disease (CKD), stage III (moderate) 03/23/2014  . Arthritis, degenerative 03/23/2014    History reviewed. No pertinent past surgical history.  Current Outpatient Rx  Name  Route  Sig  Dispense  Refill  . acetaminophen (TYLENOL) 650 MG CR tablet   Oral   Take 1,300 mg by mouth 2 (two) times daily.         Marland Kitchen albuterol (PROVENTIL HFA;VENTOLIN HFA) 108 (90 Base) MCG/ACT inhaler   Inhalation   Inhale 2 puffs into the lungs every 4  (four) hours as needed for wheezing or shortness of breath.         Marland Kitchen amoxicillin-clavulanate (AUGMENTIN) 875-125 MG tablet   Oral   Take 1 tablet by mouth 2 (two) times daily.   14 tablet   0   . ciprofloxacin (CIPRO) 500 MG tablet   Oral   Take 1 tablet (500 mg total) by mouth 2 (two) times daily.   14 tablet   0   . ferrous sulfate 325 (65 FE) MG tablet   Oral   Take 325 mg by mouth daily.         Marland Kitchen latanoprost (XALATAN) 0.005 % ophthalmic solution   Both Eyes   Place 1 drop into both eyes at bedtime.          Marland Kitchen levothyroxine (SYNTHROID, LEVOTHROID) 25 MCG tablet   Oral   Take 25 mcg by mouth daily before breakfast.         . Multiple Vitamin (MULTIVITAMIN WITH MINERALS) TABS tablet   Oral   Take 1 tablet by mouth daily.         Marland Kitchen omeprazole (PRILOSEC) 20 MG capsule   Oral   Take 20 mg by mouth daily.          Bertram Gala Glycol-Propyl Glycol (SYSTANE ULTRA) 0.4-0.3 % SOLN   Both Eyes   Place 1 drop into both eyes 2 (two) times daily.         Marland Kitchen  potassium chloride (K-DUR,KLOR-CON) 10 MEQ tablet   Oral   Take 10 mEq by mouth daily.          Marland Kitchen spironolactone (ALDACTONE) 25 MG tablet   Oral   Take 25 mg by mouth daily.          . timolol (TIMOPTIC) 0.5 % ophthalmic solution   Left Eye   Place 1 drop into the left eye daily.         Marland Kitchen torsemide (DEMADEX) 20 MG tablet   Oral   Take 20 mg by mouth daily.            Allergies Review of patient's allergies indicates no known allergies.  Family History  Problem Relation Age of Onset  . Rheum arthritis Other     Social History Social History  Substance Use Topics  . Smoking status: Never Smoker   . Smokeless tobacco: Never Used  . Alcohol Use: No    Review of Systems Constitutional: No fever/chills Eyes: No visual changes. ENT: No sore throat. Cardiovascular: Denies chest pain. Respiratory: Denies shortness of breath. Gastrointestinal: No abdominal pain.  No nausea, no  vomiting. No constipation. Genitourinary: Negative for dysuria. Musculoskeletal: Negative for back pain. Skin: Negative for rash. Neurological: Negative for headaches, focal weakness or numbness.  10-point ROS otherwise negative.  ____________________________________________   PHYSICAL EXAM:  VITAL SIGNS: ED Triage Vitals  Enc Vitals Group     BP 02/08/16 1923 126/84 mmHg     Pulse Rate 02/08/16 1923 67     Resp 02/08/16 1923 18     Temp 02/08/16 1923 97.6 F (36.4 C)     Temp Source 02/08/16 1923 Oral     SpO2 02/08/16 1923 100 %     Weight 02/08/16 1923 184 lb 4.9 oz (83.6 kg)     Height --      Head Cir --      Peak Flow --      Pain Score --      Pain Loc --      Pain Edu? --      Excl. in GC? --    Constitutional: Alert and oriented to place, month and year. Well appearing and in no acute distress. Eyes: Conjunctivae are normal. PERRL. EOMI. Head: Atraumatic. Nose: No congestion/rhinnorhea. Mouth/Throat: Mucous membranes are moist.  Oropharynx non-erythematous. Neck: No stridor.   Cardiovascular: Normal rate, regular rhythm. Grossly normal heart sounds.  Good peripheral circulation. Respiratory: Normal respiratory effort.  No retractions. Lungs CTAB. Gastrointestinal: Soft and nontender. No distention. No rebound or guarding. Musculoskeletal: No lower extremity tenderness nor edema.  No joint effusions. Mild weakness in lower extremities equal bilateral. Patient reports this is normal for her. There is moderate edema in lower extremities bilateral. Neurologic:  Normal speech and language. No gross focal neurologic deficits are appreciated.Skin:  Skin is warm, dry and intact. No rash noted. Psychiatric: Mood and affect are normal. Speech and behavior are normal.  ____________________________________________   LABS (all labs ordered are listed, but only abnormal results are displayed)  Labs Reviewed  CBC WITH DIFFERENTIAL/PLATELET - Abnormal; Notable for the  following:    RBC 2.98 (*)    Hemoglobin 10.2 (*)    HCT 30.5 (*)    MCV 102.1 (*)    MCH 34.3 (*)    All other components within normal limits  COMPREHENSIVE METABOLIC PANEL - Abnormal; Notable for the following:    BUN 68 (*)    Creatinine, Ser 1.67 (*)  Total Protein 8.6 (*)    GFR calc non Af Amer 26 (*)    GFR calc Af Amer 30 (*)    All other components within normal limits  C DIFFICILE QUICK SCREEN W PCR REFLEX  GASTROINTESTINAL PANEL BY PCR, STOOL (REPLACES STOOL CULTURE)  OCCULT BLOOD X 1 CARD TO LAB, STOOL  LIPASE, BLOOD   ____________________________________________  EKG   ____________________________________________  RADIOLOGY   ____________________________________________   PROCEDURES  Procedure(s) performed: None  Critical Care performed: No  ____________________________________________   INITIAL IMPRESSION / ASSESSMENT AND PLAN / ED COURSE  Pertinent labs & imaging results that were available during my care of the patient were reviewed by me and considered in my medical decision making (see chart for details).  Patient presents for evaluation of possible diarrhea. The patient herself reports she is only having occasional slight loose stool. She is afebrile, she does not seem to have any significant risk factors for infectious diarrheas other than other residents at her care facility having "C. difficile". She has no abdominal pain, no signs or symptoms of acute abdomen, perforation, diverticulitis. Normal white count. Based upon her clinical history, she was able to use a small bowel movement, it is nonbloody and we will send it for C. difficile and GI panel testing.   ----------------------------------------- 8:51 PM on 02/08/2016 -----------------------------------------  Patient resting comfortably. No complaints at this time. Patient is not had any bowel movement in the ER yet. The patient reports that she had a formed stool this evening. She  is very adamant that she is not suffering from severe diarrhea, though she has used the bathroom once or twice daily for the last couple of days she denies that she is having large amounts of loose or watery stool. He is not having any abdominal pain or concern presently.  Patient's GFR is reduced, however this appears about her clinical baseline. We will give her light hydration. Ongoing care assigned to Dr. Dolores Frame, plan to follow up on C. difficile and gastrointestinal panel. Would treat as per results of those. I feel the patient is likely appropriate for outpatient therapy, she has no evidence of instability, no significant signs of dehydration on than her baseline renal insufficiency. ____________________________________________   FINAL CLINICAL IMPRESSION(S) / ED DIAGNOSES  Final diagnoses:  Loose stools      Sharyn Creamer, MD 02/08/16 2333

## 2016-02-08 NOTE — ED Notes (Signed)
NAD noted at this time. Pt resting in bed with eyes closed. Will continue to monitor for further patient needs at this time. Respirations even and unlabored at this time.

## 2016-02-09 LAB — GASTROINTESTINAL PANEL BY PCR, STOOL (REPLACES STOOL CULTURE)
ASTROVIRUS: NOT DETECTED
Adenovirus F40/41: NOT DETECTED
CRYPTOSPORIDIUM: NOT DETECTED
CYCLOSPORA CAYETANENSIS: NOT DETECTED
Campylobacter species: NOT DETECTED
E. COLI O157: NOT DETECTED
ENTEROTOXIGENIC E COLI (ETEC): NOT DETECTED
Entamoeba histolytica: NOT DETECTED
Enteroaggregative E coli (EAEC): NOT DETECTED
Enteropathogenic E coli (EPEC): NOT DETECTED
Giardia lamblia: NOT DETECTED
Norovirus GI/GII: NOT DETECTED
Plesimonas shigelloides: NOT DETECTED
ROTAVIRUS A: NOT DETECTED
SAPOVIRUS (I, II, IV, AND V): NOT DETECTED
SHIGA LIKE TOXIN PRODUCING E COLI (STEC): NOT DETECTED
SHIGELLA/ENTEROINVASIVE E COLI (EIEC): NOT DETECTED
Salmonella species: NOT DETECTED
VIBRIO SPECIES: NOT DETECTED
Vibrio cholerae: NOT DETECTED
Yersinia enterocolitica: NOT DETECTED

## 2016-02-09 NOTE — ED Notes (Signed)
Patient assisted to commode by EDT. Continues to await EMS for transport back to [facility]. Family remains at bedside.

## 2016-02-09 NOTE — Discharge Instructions (Signed)
1. BRAT diet 3 days, then slowly advance diet as tolerated. 2. Return to the ER for worsening symptoms, persistent vomiting, difficulty breathing or other concerns.

## 2016-02-09 NOTE — ED Provider Notes (Signed)
-----------------------------------------   12:49 AM on 02/09/2016 -----------------------------------------  Results of C. difficile and bio fire panels are negative. Updated patient and caregiver. Advised hydration and BRAT diet along with close follow-up with her PCP. Strict return precautions given. Both verbalize understanding and agree with plan of care.  Irean Hong, MD 02/09/16 [phone]

## 2016-02-09 NOTE — ED Notes (Signed)
EMS arrival for transport.

## 2016-02-09 NOTE — ED Notes (Signed)
Patient awaiting transport. Communications states it will be 2 hours before they can pick patient up for transport. Family remains at bedside.

## 2016-06-08 ENCOUNTER — Inpatient Hospital Stay
Admission: EM | Admit: 2016-06-08 | Discharge: 2016-06-11 | DRG: 683 | Disposition: A | Discharge status: Skilled Nursing Facility | Payer: Medicare Other | Attending: Internal Medicine | Admitting: Internal Medicine

## 2016-06-08 ENCOUNTER — Emergency Department: Payer: Medicare Other

## 2016-06-08 DIAGNOSIS — Z6833 Body mass index (BMI) 33.0-33.9, adult: Secondary | ICD-10-CM

## 2016-06-08 DIAGNOSIS — H409 Unspecified glaucoma: Secondary | ICD-10-CM | POA: Diagnosis present

## 2016-06-08 DIAGNOSIS — D638 Anemia in other chronic diseases classified elsewhere: Secondary | ICD-10-CM | POA: Diagnosis present

## 2016-06-08 DIAGNOSIS — E039 Hypothyroidism, unspecified: Secondary | ICD-10-CM | POA: Diagnosis present

## 2016-06-08 DIAGNOSIS — R2681 Unsteadiness on feet: Secondary | ICD-10-CM

## 2016-06-08 DIAGNOSIS — Z66 Do not resuscitate: Secondary | ICD-10-CM | POA: Diagnosis present

## 2016-06-08 DIAGNOSIS — Z7189 Other specified counseling: Secondary | ICD-10-CM

## 2016-06-08 DIAGNOSIS — I959 Hypotension, unspecified: Secondary | ICD-10-CM | POA: Diagnosis present

## 2016-06-08 DIAGNOSIS — R778 Other specified abnormalities of plasma proteins: Secondary | ICD-10-CM

## 2016-06-08 DIAGNOSIS — N3001 Acute cystitis with hematuria: Secondary | ICD-10-CM | POA: Diagnosis present

## 2016-06-08 DIAGNOSIS — R531 Weakness: Secondary | ICD-10-CM | POA: Diagnosis not present

## 2016-06-08 DIAGNOSIS — N179 Acute kidney failure, unspecified: Secondary | ICD-10-CM | POA: Diagnosis present

## 2016-06-08 DIAGNOSIS — Z515 Encounter for palliative care: Secondary | ICD-10-CM

## 2016-06-08 DIAGNOSIS — E875 Hyperkalemia: Secondary | ICD-10-CM | POA: Diagnosis present

## 2016-06-08 DIAGNOSIS — N184 Chronic kidney disease, stage 4 (severe): Secondary | ICD-10-CM | POA: Diagnosis present

## 2016-06-08 DIAGNOSIS — E860 Dehydration: Secondary | ICD-10-CM | POA: Diagnosis present

## 2016-06-08 DIAGNOSIS — R7989 Other specified abnormal findings of blood chemistry: Secondary | ICD-10-CM

## 2016-06-08 DIAGNOSIS — B9620 Unspecified Escherichia coli [E. coli] as the cause of diseases classified elsewhere: Secondary | ICD-10-CM | POA: Diagnosis present

## 2016-06-08 DIAGNOSIS — K219 Gastro-esophageal reflux disease without esophagitis: Secondary | ICD-10-CM | POA: Diagnosis present

## 2016-06-08 DIAGNOSIS — R627 Adult failure to thrive: Secondary | ICD-10-CM | POA: Diagnosis present

## 2016-06-08 DIAGNOSIS — R262 Difficulty in walking, not elsewhere classified: Secondary | ICD-10-CM

## 2016-06-08 DIAGNOSIS — N189 Chronic kidney disease, unspecified: Secondary | ICD-10-CM

## 2016-06-08 DIAGNOSIS — L8990 Pressure ulcer of unspecified site, unspecified stage: Secondary | ICD-10-CM | POA: Insufficient documentation

## 2016-06-08 DIAGNOSIS — M6281 Muscle weakness (generalized): Secondary | ICD-10-CM

## 2016-06-08 LAB — CBC WITH DIFFERENTIAL/PLATELET
BAND NEUTROPHILS: 0 %
BASOS ABS: 0 10*3/uL (ref 0–0.1)
Basophils Relative: 0 %
Blasts: 0 %
EOS ABS: 0 10*3/uL (ref 0–0.7)
EOS PCT: 0 %
HEMATOCRIT: 28.8 % — AB (ref 35.0–47.0)
Hemoglobin: 8.9 g/dL — ABNORMAL LOW (ref 12.0–16.0)
LYMPHS ABS: 2.9 10*3/uL (ref 1.0–3.6)
LYMPHS PCT: 24 %
MCH: 31.5 pg (ref 26.0–34.0)
MCHC: 30.9 g/dL — AB (ref 32.0–36.0)
MCV: 101.9 fL — ABNORMAL HIGH (ref 80.0–100.0)
METAMYELOCYTES PCT: 0 %
MONOS PCT: 5 %
Monocytes Absolute: 0.6 10*3/uL (ref 0.2–0.9)
Myelocytes: 0 %
NEUTROS ABS: 8.6 10*3/uL — AB (ref 1.4–6.5)
Neutrophils Relative %: 71 %
OTHER: 0 %
Platelets: 229 10*3/uL (ref 150–440)
Promyelocytes Absolute: 0 %
RBC: 2.83 MIL/uL — AB (ref 3.80–5.20)
RDW: 16.1 % — AB (ref 11.5–14.5)
WBC: 12.1 10*3/uL — ABNORMAL HIGH (ref 3.6–11.0)
nRBC: 2 /100 WBC — ABNORMAL HIGH

## 2016-06-08 LAB — URINALYSIS COMPLETE WITH MICROSCOPIC (ARMC ONLY)
BILIRUBIN URINE: NEGATIVE
GLUCOSE, UA: NEGATIVE mg/dL
KETONES UR: NEGATIVE mg/dL
NITRITE: NEGATIVE
PROTEIN: 30 mg/dL — AB
SPECIFIC GRAVITY, URINE: 1.014 (ref 1.005–1.030)
pH: 6 (ref 5.0–8.0)

## 2016-06-08 LAB — MAGNESIUM: Magnesium: 2.9 mg/dL — ABNORMAL HIGH (ref 1.7–2.4)

## 2016-06-08 LAB — PROTIME-INR
INR: 1.28
Prothrombin Time: 16.1 seconds — ABNORMAL HIGH (ref 11.4–15.2)

## 2016-06-08 LAB — COMPREHENSIVE METABOLIC PANEL
ALBUMIN: 3.6 g/dL (ref 3.5–5.0)
ALK PHOS: 338 U/L — AB (ref 38–126)
ALT: 26 U/L (ref 14–54)
ANION GAP: 9 (ref 5–15)
AST: 47 U/L — ABNORMAL HIGH (ref 15–41)
BILIRUBIN TOTAL: 0.6 mg/dL (ref 0.3–1.2)
BUN: 105 mg/dL — ABNORMAL HIGH (ref 6–20)
CALCIUM: 8.7 mg/dL — AB (ref 8.9–10.3)
CO2: 17 mmol/L — ABNORMAL LOW (ref 22–32)
Chloride: 111 mmol/L (ref 101–111)
Creatinine, Ser: 3.35 mg/dL — ABNORMAL HIGH (ref 0.44–1.00)
GFR calc non Af Amer: 11 mL/min — ABNORMAL LOW (ref >60)
GFR, EST AFRICAN AMERICAN: 13 mL/min — AB (ref >60)
GLUCOSE: 132 mg/dL — AB (ref 65–99)
POTASSIUM: 6.8 mmol/L — AB (ref 3.5–5.1)
Sodium: 137 mmol/L (ref 135–145)
TOTAL PROTEIN: 8.4 g/dL — AB (ref 6.5–8.1)

## 2016-06-08 LAB — BASIC METABOLIC PANEL
ANION GAP: 8 (ref 5–15)
BUN: 100 mg/dL — ABNORMAL HIGH (ref 6–20)
CALCIUM: 8.4 mg/dL — AB (ref 8.9–10.3)
CO2: 19 mmol/L — AB (ref 22–32)
CREATININE: 3.11 mg/dL — AB (ref 0.44–1.00)
Chloride: 114 mmol/L — ABNORMAL HIGH (ref 101–111)
GFR, EST AFRICAN AMERICAN: 14 mL/min — AB (ref >60)
GFR, EST NON AFRICAN AMERICAN: 12 mL/min — AB (ref >60)
Glucose, Bld: 104 mg/dL — ABNORMAL HIGH (ref 65–99)
Potassium: 6.7 mmol/L (ref 3.5–5.1)
Sodium: 141 mmol/L (ref 135–145)

## 2016-06-08 LAB — PHOSPHORUS: Phosphorus: 3.9 mg/dL (ref 2.5–4.6)

## 2016-06-08 LAB — APTT: aPTT: 35 seconds (ref 24–36)

## 2016-06-08 LAB — TROPONIN I: Troponin I: 0.08 ng/mL (ref <0.03)

## 2016-06-08 MED ORDER — ACETAMINOPHEN 325 MG PO TABS: 650.0000 mg | ORAL_TABLET | Freq: Four times a day (QID) | ORAL | Status: DC | PRN

## 2016-06-08 MED ORDER — HEPARIN BOLUS VIA INFUSION
3950.0000 [IU] | Freq: Once | INTRAVENOUS | Status: AC
  Administered 2016-06-08: 3950 [IU] via INTRAVENOUS
  Filled 2016-06-08: qty 3950

## 2016-06-08 MED ORDER — ASPIRIN 81 MG PO CHEW
81.0000 mg | CHEWABLE_TABLET | Freq: Every day | ORAL | Status: DC
  Administered 2016-06-09 – 2016-06-11 (×3): 81 mg via ORAL
  Filled 2016-06-08 (×3): qty 1

## 2016-06-08 MED ORDER — SODIUM BICARBONATE 8.4 % IV SOLN
INTRAVENOUS | Status: DC
  Administered 2016-06-08 – 2016-06-10 (×4): via INTRAVENOUS
  Filled 2016-06-08 (×9): qty 100

## 2016-06-08 MED ORDER — ALBUTEROL SULFATE (2.5 MG/3ML) 0.083% IN NEBU: 2.5000 mg | INHALATION_SOLUTION | RESPIRATORY_TRACT | Status: DC | PRN

## 2016-06-08 MED ORDER — ALBUTEROL SULFATE HFA 108 (90 BASE) MCG/ACT IN AERS: 2.0000 | INHALATION_SPRAY | RESPIRATORY_TRACT | Status: DC | PRN

## 2016-06-08 MED ORDER — LATANOPROST 0.005 % OP SOLN
1.0000 [drp] | Freq: Every day | OPHTHALMIC | Status: DC
  Administered 2016-06-08 – 2016-06-10 (×3): 1 [drp] via OPHTHALMIC
  Filled 2016-06-08: qty 2.5

## 2016-06-08 MED ORDER — TIMOLOL MALEATE 0.5 % OP SOLN
2.0000 [drp] | Freq: Two times a day (BID) | OPHTHALMIC | Status: DC
  Administered 2016-06-08 – 2016-06-11 (×6): 2 [drp] via OPHTHALMIC
  Filled 2016-06-08: qty 5

## 2016-06-08 MED ORDER — ONDANSETRON HCL 4 MG/2ML IJ SOLN: 4.0000 mg | Freq: Four times a day (QID) | INTRAMUSCULAR | Status: DC | PRN

## 2016-06-08 MED ORDER — BISACODYL 5 MG PO TBEC: 5.0000 mg | DELAYED_RELEASE_TABLET | Freq: Every day | ORAL | Status: DC | PRN

## 2016-06-08 MED ORDER — ALBUTEROL SULFATE (2.5 MG/3ML) 0.083% IN NEBU
2.5000 mg | INHALATION_SOLUTION | Freq: Once | RESPIRATORY_TRACT | Status: AC
  Administered 2016-06-08: 2.5 mg via RESPIRATORY_TRACT
  Filled 2016-06-08: qty 3

## 2016-06-08 MED ORDER — ONDANSETRON HCL 4 MG PO TABS: 4.0000 mg | ORAL_TABLET | Freq: Four times a day (QID) | ORAL | Status: DC | PRN

## 2016-06-08 MED ORDER — SODIUM CHLORIDE 0.9 % IV SOLN
1.0000 g | Freq: Once | INTRAVENOUS | Status: AC
  Administered 2016-06-08: 19:00:00 1 g via INTRAVENOUS
  Filled 2016-06-08: qty 10

## 2016-06-08 MED ORDER — HEPARIN (PORCINE) IN NACL 100-0.45 UNIT/ML-% IJ SOLN
650.0000 [IU]/h | INTRAMUSCULAR | Status: DC
  Administered 2016-06-08: 800 [IU]/h via INTRAVENOUS
  Filled 2016-06-08 (×2): qty 250

## 2016-06-08 MED ORDER — SODIUM POLYSTYRENE SULFONATE 15 GM/60ML PO SUSP
30.0000 g | Freq: Once | ORAL | Status: AC
  Administered 2016-06-08: 20:00:00 30 g via ORAL
  Filled 2016-06-08: qty 120

## 2016-06-08 MED ORDER — ACETAMINOPHEN 650 MG RE SUPP: 650.0000 mg | Freq: Four times a day (QID) | RECTAL | Status: DC | PRN

## 2016-06-08 MED ORDER — SODIUM CHLORIDE 0.9 % IV SOLN
Freq: Once | INTRAVENOUS | Status: AC
  Administered 2016-06-08: 16:00:00 via INTRAVENOUS

## 2016-06-08 MED ORDER — SENNOSIDES-DOCUSATE SODIUM 8.6-50 MG PO TABS: 1.0000 | ORAL_TABLET | Freq: Every evening | ORAL | Status: DC | PRN

## 2016-06-08 MED ORDER — LEVOTHYROXINE SODIUM 25 MCG PO TABS
25.0000 ug | ORAL_TABLET | Freq: Every day | ORAL | Status: DC
  Administered 2016-06-09 – 2016-06-11 (×3): 25 ug via ORAL
  Filled 2016-06-08 (×3): qty 1

## 2016-06-08 MED ORDER — HEPARIN SODIUM (PORCINE) 5000 UNIT/ML IJ SOLN: 5000.0000 [IU] | Freq: Three times a day (TID) | INTRAMUSCULAR | Status: DC

## 2016-06-08 MED ORDER — DOCUSATE SODIUM 100 MG PO CAPS
100.0000 mg | ORAL_CAPSULE | Freq: Two times a day (BID) | ORAL | Status: DC
  Administered 2016-06-08 – 2016-06-11 (×6): 100 mg via ORAL
  Filled 2016-06-08 (×6): qty 1

## 2016-06-08 MED ORDER — SODIUM BICARBONATE 8.4 % IV SOLN
50.0000 meq | Freq: Once | INTRAVENOUS | Status: AC
  Administered 2016-06-08: 50 meq via INTRAVENOUS
  Filled 2016-06-08: qty 50

## 2016-06-08 MED ORDER — FAMOTIDINE 20 MG PO TABS
20.0000 mg | ORAL_TABLET | Freq: Two times a day (BID) | ORAL | Status: DC
  Administered 2016-06-08 – 2016-06-10 (×4): 20 mg via ORAL
  Filled 2016-06-08 (×4): qty 1

## 2016-06-08 NOTE — H&P (Addendum)
[facility] - [place] at [facility]   PATIENT NAME: Heather Zuniga    MR#:  [id number]  DATE OF BIRTH:  1923-08-06  DATE OF ADMISSION:  06/08/2016  PRIMARY CARE PHYSICIAN: No primary care provider on file.   REQUESTING/REFERRING PHYSICIAN: Arnaldo Natal, MD  CHIEF COMPLAINT:   Chief Complaint  Patient presents with  . Weakness   Generalized weakness and poor oral intake HISTORY OF PRESENT ILLNESS:  Heather Zuniga  is a 82 y.o. female with a known history of Peripheral edema, anemia and the CKD stage III. The patient was sent from assisted living to the ED due to generalized weakness, poor oral intake and the failure to thrive by her family. The patient only complains of a generalized weakness but denies any other symptoms. She was found elevated potassium at 6.8 and elevated creatinine at 3.35.  PAST MEDICAL HISTORY:   Past Medical History:  Diagnosis Date  . Anemia   . Arthritis   . CKD (chronic kidney disease) stage 3, GFR 30-59 ml/min   . Depression   . Edema   . GERD (gastroesophageal reflux disease)   . Pyuria     PAST SURGICAL HISTORY:  History reviewed. No pertinent surgical history.  SOCIAL HISTORY:   Social History  Substance Use Topics  . Smoking status: Never Smoker  . Smokeless tobacco: Never Used  . Alcohol use No    FAMILY HISTORY:   Family History  Problem Relation Age of Onset  . Rheum arthritis Other     DRUG ALLERGIES:  No Known Allergies  REVIEW OF SYSTEMS:   Review of Systems  Constitutional: Positive for malaise/fatigue and weight loss. Negative for chills, diaphoresis and fever.  Eyes: Negative for blurred vision and double vision.  Respiratory: Negative for cough, shortness of breath and stridor.   Cardiovascular: Negative for chest pain and leg swelling.  Gastrointestinal: Negative for abdominal pain, blood in stool, diarrhea, melena, nausea and vomiting.  Genitourinary: Negative for dysuria and urgency.    Musculoskeletal: Negative for joint pain.  Skin: Negative for itching and rash.  Neurological: Positive for weakness. Negative for dizziness, focal weakness, loss of consciousness and headaches.  Psychiatric/Behavioral: Negative for depression. The patient is not nervous/anxious.     MEDICATIONS AT HOME:   Prior to Admission medications   Medication Sig Start Date End Date Taking? Authorizing Provider  acetaminophen (TYLENOL) 650 MG CR tablet Take 1,300 mg by mouth 2 (two) times daily.   Yes Historical Provider, MD  albuterol (PROVENTIL HFA;VENTOLIN HFA) 108 (90 Base) MCG/ACT inhaler Inhale 2 puffs into the lungs every 4 (four) hours as needed for wheezing or shortness of breath.   Yes Historical Provider, MD  latanoprost (XALATAN) 0.005 % ophthalmic solution Place 1 drop into the left eye at bedtime.    Yes Historical Provider, MD  levothyroxine (SYNTHROID, LEVOTHROID) 25 MCG tablet Take 25 mcg by mouth daily before breakfast.   Yes Historical Provider, MD  Multiple Vitamin (MULTIVITAMIN WITH MINERALS) TABS tablet Take 1 tablet by mouth daily.   Yes Historical Provider, MD  ranitidine (ZANTAC) 150 MG tablet Take 150 mg by mouth 2 (two) times daily.   Yes Historical Provider, MD  spironolactone (ALDACTONE) 25 MG tablet Take 25 mg by mouth daily.    Yes Historical Provider, MD  timolol (TIMOPTIC) 0.5 % ophthalmic solution Place 2 drops into the left eye 2 (two) times daily.    Yes Historical Provider, MD  torsemide (DEMADEX) 20 MG tablet Take  20 mg by mouth daily.    Yes Historical Provider, MD  white petrolatum (SM PETROLEUM JELLY) GEL Apply 1 application topically 2 (two) times daily. Apply to bilateral lower extremities twice a day   Yes Historical Provider, MD      VITAL SIGNS:  Blood pressure (!) 102/55, pulse 67, temperature 97.3 F (36.3 C), height 5\' 1"  (1.549 m), weight 178 lb (80.7 kg), SpO2 97 %.  PHYSICAL EXAMINATION:  Physical Exam  GENERAL:  82 y.o.-year-old patient lying  in the bed with no acute distress.  EYES: Pupils equal, round, reactive to light and accommodation. No scleral icterus. Extraocular muscles intact.  HEENT: Head atraumatic, normocephalic. Oropharynx and nasopharynx clear. Moist oral mucosa. NECK:  Supple, no jugular venous distention. No thyroid enlargement, no tenderness.  LUNGS: Normal breath sounds bilaterally, no wheezing, rales,rhonchi or crepitation. No use of accessory muscles of respiration.  CARDIOVASCULAR: S1, S2 normal. No murmurs, rubs, or gallops.  ABDOMEN: Soft, nontender, nondistended. Bowel sounds present. No organomegaly or mass.  EXTREMITIES: No pedal edema, cyanosis, or clubbing. Chronic skin changes on bilateral lower extremities. NEUROLOGIC: Cranial nerves II through XII are intact. Muscle strength 2-3/5 in all extremities. Sensation intact. Gait not checked.  PSYCHIATRIC: The patient is alert and oriented x 3.  SKIN: No obvious rash, lesion, or ulcer.   LABORATORY PANEL:   CBC  Recent Labs Lab 06/08/16 1435  WBC 12.1*  HGB 8.9*  HCT 28.8*  PLT 229   ------------------------------------------------------------------------------------------------------------------  Chemistries   Recent Labs Lab 06/08/16 1435  NA 137  K 6.8*  CL 111  CO2 17*  GLUCOSE 132*  BUN 105*  CREATININE 3.35*  CALCIUM 8.7*  AST 47*  ALT 26  ALKPHOS 338*  BILITOT 0.6   ------------------------------------------------------------------------------------------------------------------  Cardiac Enzymes  Recent Labs Lab 06/08/16 1435  TROPONINI 0.08*   ------------------------------------------------------------------------------------------------------------------  RADIOLOGY:  Dg Chest Portable 1 View  Result Date: 06/08/2016 CLINICAL DATA:  Weakness and failure to thrive. EXAM: PORTABLE CHEST 1 VIEW COMPARISON:  10/27/2015 FINDINGS: Cardiac silhouette remains upper limits of normal in size. No airspace consolidation,  edema, pleural effusion, or pneumothorax is seen. No acute osseous abnormality is identified. IMPRESSION: No active disease. Electronically Signed   By: Sebastian Ache M.D.   On: 06/08/2016 16:06      IMPRESSION AND PLAN:   Hyperkalemia The patient will be admitted to medical floor. She was treated with bicarbonate, I will continue bicarbonate, give calcium gluconate and Kayexalate, follow-up potassium level today.  Acute renal failure on CKD stage III. Hold the spironolactone and torsemide, give IV fluid support and follow-up BMP. Renal ultrasound and nephrology consult.  Hypotension. Hold spironolactone and torsemide, continue IV fluid support.  Elevated troponin, due to acute renal failure. Follow-up troponin level. Start aspirin.  Second troponin up to 2.03, possible NSTEMI, start heparin drip and cardiology consult.  Leukocytosis. Unclear ideology, follow-up urine analysis.  Poor oral intake and failure to thrive. Dietitian consult.  Generalized weakness. Physical therapy evaluation.  Anemia of chronic disease. Hemoglobin decreased from 10.2 in July to 8.9 today. Follow hemoglobin and the stool occult.  All the records are reviewed and case discussed with ED provider. Management plans discussed with the patient, family and they are in agreement.  CODE STATUS: DO NOT RESUSCITATE  TOTAL CRITICAL TIME TAKING CARE OF THIS PATIENT: 60 minutes.    Shaune Pollack M.D on 06/08/2016 at 4:15 PM  Between 7am to 6pm - Pager - [phone]  After 6pm go to [email or web address] -  password Environmental education officer  [employer] [place] [facility]  Office  [phone]  CC: Primary care physician; No primary care provider on file.   Note: This dictation was prepared with Dragon dictation along with smaller phrase technology. Any transcriptional errors that result from this process are unintentional.

## 2016-06-08 NOTE — ED Notes (Signed)
[facility] assisted [facility] contact number [phone]

## 2016-06-08 NOTE — ED Provider Notes (Signed)
[facility] Emergency Department Provider Note   ____________________________________________   First MD Initiated Contact with Patient 06/08/16 1515     (approximate)  I have reviewed the triage vital signs and the nursing notes.   HISTORY  Chief Complaint Weakness   HPI Heather Zuniga is a 81 y.o. female patient reportedly sent from assisted living because of weakness and failure to thrive. Patient herself says she doesn't know why she is here she feels fine. Patient denies any sneezing coughing fever chills or any other complaints. She is unable sit up in bed without my help and even that is difficult. Calls and reports her troponin is elevated 0.08 and potassium 6+.   Past Medical History:  Diagnosis Date  . Anemia   . Arthritis   . CKD (chronic kidney disease) stage 3, GFR 30-59 ml/min   . Depression   . Edema   . GERD (gastroesophageal reflux disease)   . Pyuria     Patient Active Problem List   Diagnosis Date Noted  . UTI (lower urinary tract infection) 10/27/2015  . GERD (gastroesophageal reflux disease) 06/09/2015  . Obesity 06/09/2015  . Arthritis 06/09/2015  . Edema 06/09/2015  . Stasis leg ulcer (HCC) 06/09/2015  . Absolute anemia 03/23/2014  . Chronic kidney disease (CKD), stage III (moderate) 03/23/2014  . Arthritis, degenerative 03/23/2014    History reviewed. No pertinent surgical history.  Prior to Admission medications   Medication Sig Start Date End Date Taking? Authorizing Provider  acetaminophen (TYLENOL) 650 MG CR tablet Take 1,300 mg by mouth 2 (two) times daily.   Yes Historical Provider, MD  albuterol (PROVENTIL HFA;VENTOLIN HFA) 108 (90 Base) MCG/ACT inhaler Inhale 2 puffs into the lungs every 4 (four) hours as needed for wheezing or shortness of breath.   Yes Historical Provider, MD  latanoprost (XALATAN) 0.005 % ophthalmic solution Place 1 drop into the left eye at bedtime.    Yes Historical Provider, MD    levothyroxine (SYNTHROID, LEVOTHROID) 25 MCG tablet Take 25 mcg by mouth daily before breakfast.   Yes Historical Provider, MD  Multiple Vitamin (MULTIVITAMIN WITH MINERALS) TABS tablet Take 1 tablet by mouth daily.   Yes Historical Provider, MD  ranitidine (ZANTAC) 150 MG tablet Take 150 mg by mouth 2 (two) times daily.   Yes Historical Provider, MD  spironolactone (ALDACTONE) 25 MG tablet Take 25 mg by mouth daily.    Yes Historical Provider, MD  timolol (TIMOPTIC) 0.5 % ophthalmic solution Place 2 drops into the left eye 2 (two) times daily.    Yes Historical Provider, MD  torsemide (DEMADEX) 20 MG tablet Take 20 mg by mouth daily.    Yes Historical Provider, MD  white petrolatum (SM PETROLEUM JELLY) GEL Apply 1 application topically 2 (two) times daily. Apply to bilateral lower extremities twice a day   Yes Historical Provider, MD    Allergies Review of patient's allergies indicates no known allergies.  Family History  Problem Relation Age of Onset  . Rheum arthritis Other     Social History Social History  Substance Use Topics  . Smoking status: Never Smoker  . Smokeless tobacco: Never Used  . Alcohol use No    Review of Systems Constitutional: No fever/chills Eyes: No visual changes. ENT: No sore throat. Cardiovascular: Denies chest pain. Respiratory: Denies shortness of breath. Gastrointestinal: No abdominal pain.  No nausea, no vomiting.  No diarrhea.  No constipation. Genitourinary: Negative for dysuria. Musculoskeletal: Negative for back pain. Skin: Negative for  rash. Neurological: Negative for headaches, focal weakness or numbness.  10-point ROS otherwise negative.  ____________________________________________   PHYSICAL EXAM:  VITAL SIGNS: ED Triage Vitals  Enc Vitals Group     BP 06/08/16 1418 (!) 102/55     Pulse Rate 06/08/16 1418 67     Resp --      Temp 06/08/16 1423 97.3 F (36.3 C)     Temp src --      SpO2 06/08/16 1418 97 %     Weight  06/08/16 1417 178 lb (80.7 kg)     Height 06/08/16 1417 5\' 1"  (1.549 m)     Head Circumference --      Peak Flow --      Pain Score --      Pain Loc --      Pain Edu? --      Excl. in GC? --     Constitutional: Alert and oriented. Well appearing and in no acute distress. Eyes: Conjunctivae are normal. PERRL. EOMI. Head: Atraumatic. Nose: No congestion/rhinnorhea. Mouth/Throat: Mucous membranes are moist.  Oropharynx non-erythematous. Neck: No stridor.   Cardiovascular: Normal rate, regular rhythm. Grossly normal heart sounds.  Good peripheral circulation. Respiratory: Normal respiratory effort.  No retractions. Lungs CTAB. Gastrointestinal: Soft and nontender. No distention. No abdominal bruits. No CVA tenderness. Musculoskeletal: No lower extremity tenderness nor edema. Patient does have chronic skin changes consistent with edema which is improved markedly. Basically wrinkled skin No joint effusions. Neurologic:  Normal speech and language. Patient seems diffusely weak  ____________________________________________   LABS (all labs ordered are listed, but only abnormal results are displayed)  Labs Reviewed  CBC WITH DIFFERENTIAL/PLATELET - Abnormal; Notable for the following:       Result Value   WBC 12.1 (*)    RBC 2.83 (*)    Hemoglobin 8.9 (*)    HCT 28.8 (*)    MCV 101.9 (*)    MCHC 30.9 (*)    RDW 16.1 (*)    nRBC 2 (*)    Neutro Abs 8.6 (*)    All other components within normal limits  COMPREHENSIVE METABOLIC PANEL - Abnormal; Notable for the following:    Potassium 6.8 (*)    CO2 17 (*)    Glucose, Bld 132 (*)    BUN 105 (*)    Creatinine, Ser 3.35 (*)    Calcium 8.7 (*)    Total Protein 8.4 (*)    AST 47 (*)    Alkaline Phosphatase 338 (*)    GFR calc non Af Amer 11 (*)    GFR calc Af Amer 13 (*)    All other components within normal limits  TROPONIN I - Abnormal; Notable for the following:    Troponin I 0.08 (*)    All other components within normal  limits  URINE CULTURE  URINALYSIS COMPLETEWITH MICROSCOPIC ([facility] ONLY)   ____________________________________________  EKG  EKG read and interpreted by me shows normal sinus rhythm rate of 63 left axis no acute ST-T wave changes. The computer is reading ST elevation inferiorly and did not see that. ____________________________________________  RADIOLOGY  Asked x-ray pending ____________________________________________   PROCEDURES  Procedure(s) performed:   Procedures  Critical Care performed:   ____________________________________________   INITIAL IMPRESSION / ASSESSMENT AND PLAN / ED COURSE  Pertinent labs & imaging results that were available during my care of the patient were reviewed by me and considered in my medical decision making (see chart for details).  Clinical Course     ____________________________________________   FINAL CLINICAL IMPRESSION(S) / ED DIAGNOSES  Final diagnoses:  Weakness  Dehydration  Elevated troponin  AKI (acute kidney injury) (HCC)      NEW MEDICATIONS STARTED DURING THIS VISIT:  New Prescriptions   No medications on file     Note:  This document was prepared using Dragon voice recognition software and may include unintentional dictation errors.    Arnaldo Natal, MD 06/08/16 [phone]

## 2016-06-08 NOTE — Progress Notes (Signed)
Pts troponin 2.3 and potassium 6.7.  Kayexelate given.  Pt to be transferred to room #234 per MD.  Report called to [facility].  Iv placed in right wrist for heparin drip once on telemetry.

## 2016-06-08 NOTE — ED Triage Notes (Signed)
PT  From [facility], staff reports weakness and FTT, pt denies nay complains states " they want me to eat like a horse and I dont want to"

## 2016-06-08 NOTE — Consult Note (Signed)
ANTICOAGULATION CONSULT NOTE - Initial Consult  Pharmacy Consult for heparin drip Indication: chest pain/ACS  No Known Allergies  Patient Measurements: Height: 5\' 1"  (154.9 cm) Weight: 178 lb (80.7 kg) IBW/kg (Calculated) : 47.8 Heparin Dosing Weight: 66kg  Vital Signs: Temp: 98.1 F (36.7 C) (10/31 1733) Temp Source: Oral (10/31 1733) BP: 116/65 (10/31 1733) Pulse Rate: 65 (10/31 1733)  Labs:  Recent Labs  06/08/16 1435 06/08/16 1806  HGB 8.9*  --   HCT 28.8*  --   PLT 229  --   CREATININE 3.35* 3.11*  TROPONINI 0.08* 2.03*    Estimated Creatinine Clearance: 11.3 mL/min (by C-G formula based on SCr of 3.11 mg/dL (H)).   Medical History: Past Medical History:  Diagnosis Date  . Anemia   . Arthritis   . CKD (chronic kidney disease) stage 3, GFR 30-59 ml/min   . Depression   . Edema   . GERD (gastroesophageal reflux disease)   . Pyuria     Medications:  Scheduled:  . aspirin  81 mg Oral Daily  . calcium gluconate  1 g Intravenous Once  . docusate sodium  100 mg Oral BID  . famotidine  20 mg Oral BID  . heparin  3,950 Units Intravenous Once  . latanoprost  1 drop Left Eye QHS  . [START ON 06/09/2016] levothyroxine  25 mcg Oral QAC breakfast  . timolol  2 drop Left Eye BID    Assessment: Pt is a 82 year old female admitted with failure to thrive, found to have repeat elevated troponin. Pharmacy consulted for heparin drip. No home anticoagulants. Add on INR and APTT added.  Goal of Therapy:  Heparin level 0.3-0.7 units/ml Monitor platelets by anticoagulation protocol: Yes   Plan:  Give 3950 units bolus x 1 Start heparin infusion at 800 units/hr Check anti-Xa level in 8 hours and daily while on heparin Continue to monitor H&H and platelets  Rosemond Lyttle D Shayonna Ocampo 06/08/2016,7:37 PM

## 2016-06-09 ENCOUNTER — Inpatient Hospital Stay: Payer: Medicare Other

## 2016-06-09 ENCOUNTER — Inpatient Hospital Stay
Admit: 2016-06-09 | Discharge: 2016-06-09 | Disposition: A | Discharge status: Home / Self Care | Payer: Medicare Other | Attending: Internal Medicine | Admitting: Internal Medicine

## 2016-06-09 LAB — BASIC METABOLIC PANEL
ANION GAP: 9 (ref 5–15)
BUN: 97 mg/dL — ABNORMAL HIGH (ref 6–20)
CALCIUM: 8.3 mg/dL — AB (ref 8.9–10.3)
CO2: 19 mmol/L — AB (ref 22–32)
Chloride: 114 mmol/L — ABNORMAL HIGH (ref 101–111)
Creatinine, Ser: 2.75 mg/dL — ABNORMAL HIGH (ref 0.44–1.00)
GFR calc Af Amer: 16 mL/min — ABNORMAL LOW (ref >60)
GFR calc non Af Amer: 14 mL/min — ABNORMAL LOW (ref >60)
GLUCOSE: 122 mg/dL — AB (ref 65–99)
Potassium: 5.2 mmol/L — ABNORMAL HIGH (ref 3.5–5.1)
Sodium: 142 mmol/L (ref 135–145)

## 2016-06-09 LAB — TROPONIN I
TROPONIN I: 0.07 ng/mL — AB (ref <0.03)
TROPONIN I: 0.06 ng/mL — AB (ref <0.03)
Troponin I: 0.06 ng/mL (ref <0.03)

## 2016-06-09 LAB — CBC
HEMATOCRIT: 24.9 % — AB (ref 35.0–47.0)
HEMOGLOBIN: 8 g/dL — AB (ref 12.0–16.0)
MCH: 31.8 pg (ref 26.0–34.0)
MCHC: 32.1 g/dL (ref 32.0–36.0)
MCV: 99 fL (ref 80.0–100.0)
Platelets: 212 10*3/uL (ref 150–440)
RBC: 2.52 MIL/uL — ABNORMAL LOW (ref 3.80–5.20)
RDW: 15.8 % — ABNORMAL HIGH (ref 11.5–14.5)
WBC: 11.8 10*3/uL — ABNORMAL HIGH (ref 3.6–11.0)

## 2016-06-09 LAB — HEPARIN LEVEL (UNFRACTIONATED): HEPARIN UNFRACTIONATED: 0.9 [IU]/mL — AB (ref 0.30–0.70)

## 2016-06-09 MED ORDER — CEFTRIAXONE SODIUM 1 G IJ SOLR: 1.0000 g | INTRAMUSCULAR | Status: DC

## 2016-06-09 MED ORDER — HEPARIN SODIUM (PORCINE) 5000 UNIT/ML IJ SOLN
5000.0000 [IU] | Freq: Three times a day (TID) | INTRAMUSCULAR | Status: DC
  Administered 2016-06-09 – 2016-06-11 (×4): 5000 [IU] via SUBCUTANEOUS
  Filled 2016-06-09 (×6): qty 1

## 2016-06-09 MED ORDER — CEFTRIAXONE SODIUM-DEXTROSE 1-3.74 GM-% IV SOLR
1.0000 g | INTRAVENOUS | Status: DC
  Administered 2016-06-09 – 2016-06-10 (×2): 1 g via INTRAVENOUS
  Filled 2016-06-09 (×3): qty 50

## 2016-06-09 NOTE — Progress Notes (Signed)
Patient ID: Heather Zuniga, female   DOB: 05-23-1923, 82 y.o.   MRN: [id number]  [employer] PROGRESS NOTE  Xareni Kelch JME:[id number] DOB: 09/30/1922 DOA: 06/08/2016 PCP: No primary care provider on file.  HPI/Subjective: Patient offers no complaints. She complains of no chest pain or shortness of breath. Feels weak. At her facility is not been eating very well.  Objective: Vitals:   06/09/16 0956 06/09/16 1143  BP: (!) 113/50 (!) 108/54  Pulse: 79 82  Resp:  12  Temp:      Intake/Output Summary (Last 24 hours) at 06/09/16 1537 Last data filed at 06/09/16 1350  Gross per 24 hour  Intake           904.08 ml  Output              400 ml  Net           504.08 ml   Filed Weights   06/08/16 1417  Weight: 80.7 kg (178 lb)    ROS: Review of Systems  Constitutional: Negative for chills and fever.  Eyes: Negative for blurred vision.  Respiratory: Negative for cough and shortness of breath.   Cardiovascular: Negative for chest pain.  Gastrointestinal: Negative for abdominal pain, constipation, diarrhea, nausea and vomiting.  Genitourinary: Negative for dysuria.  Musculoskeletal: Negative for joint pain.  Neurological: Negative for dizziness and headaches.   Exam: Physical Exam  HENT:  Nose: No mucosal edema.  Mouth/Throat: No oropharyngeal exudate or posterior oropharyngeal edema.  Eyes: Conjunctivae, EOM and lids are normal. Pupils are equal, round, and reactive to light.  Neck: No JVD present. Carotid bruit is not present. No edema present. No thyroid mass and no thyromegaly present.  Cardiovascular: S1 normal and S2 normal.  Exam reveals no gallop.   No murmur heard. Pulses:      Dorsalis pedis pulses are 2+ on the right side, and 2+ on the left side.  Respiratory: No respiratory distress. She has decreased breath sounds in the right lower field and the left lower field. She has no wheezes. She has no rhonchi. She has no rales.  GI: Soft. Bowel sounds are normal. There  is no tenderness.  Musculoskeletal:       Right ankle: She exhibits swelling.       Left ankle: She exhibits swelling.  Lymphadenopathy:    She has no cervical adenopathy.  Neurological: She is alert. No cranial nerve deficit.  Skin: Skin is warm. No rash noted. Nails show no clubbing.  Psychiatric: She has a normal mood and affect.      Data Reviewed: Basic Metabolic Panel:  Recent Labs Lab 06/08/16 1435 06/08/16 1806 06/09/16 0156  NA 137 141 142  K 6.8* 6.7* 5.2*  CL 111 114* 114*  CO2 17* 19* 19*  GLUCOSE 132* 104* 122*  BUN 105* 100* 97*  CREATININE 3.35* 3.11* 2.75*  CALCIUM 8.7* 8.4* 8.3*  MG  --  2.9*  --   PHOS  --  3.9  --    Liver Function Tests:  Recent Labs Lab 06/08/16 1435  AST 47*  ALT 26  ALKPHOS 338*  BILITOT 0.6  PROT 8.4*  ALBUMIN 3.6   CBC:  Recent Labs Lab 06/08/16 1435 06/09/16 0156  WBC 12.1* 11.8*  NEUTROABS 8.6*  --   HGB 8.9* 8.0*  HCT 28.8* 24.9*  MCV 101.9* 99.0  PLT 229 212   Cardiac Enzymes:  Recent Labs Lab 06/08/16 1435 06/08/16 1806 06/09/16 0156 06/09/16 0759  TROPONINI  0.08* 0.07* 0.06* 0.06*     Studies: US Renal  Result Date: 06/09/2016 CLINICAL DATA:  Acute renal failure EXAM: RENAL / URINARY TRACT ULTRASOUND COMPLETE COMPARISON:  None FINDINGS: Right Kidney: Length: 8.9 cm. Cortical thinning. Upper normal cortical echogenicity. Small lobulated cyst medially at mid RIGHT kidney 2.0 x 1.7 x 4.9 cm. Additional tiny cyst lateral RIGHT kidney 11 x 10 x 11 mm. No additional mass, hydronephrosis, or shadowing calcification Left Kidney: Length: 8.0 cm. Cortical thinning. Upper normal cortical echogenicity. No mass, hydronephrosis, or shadowing calcification. Bladder: Appears normal for degree of bladder distention. Mild to moderate lower abdominal ascites greater on RIGHT. Gallbladder filled with multiple shadowing calculi. IMPRESSION: Atrophic appearing kidneys with small RIGHT renal cyst. Otherwise unremarkable  renal ultrasound. Mild to moderate lower abdominal ascites. Cholelithiasis. Electronically Signed   By: Ulyses Southward M.D.   On: 06/09/2016 09:17   Dg Chest Portable 1 View  Result Date: 06/08/2016 CLINICAL DATA:  Weakness and failure to thrive. EXAM: PORTABLE CHEST 1 VIEW COMPARISON:  10/27/2015 FINDINGS: Cardiac silhouette remains upper limits of normal in size. No airspace consolidation, edema, pleural effusion, or pneumothorax is seen. No acute osseous abnormality is identified. IMPRESSION: No active disease. Electronically Signed   By: Sebastian Ache M.D.   On: 06/08/2016 16:06    Scheduled Meds: . aspirin  81 mg Oral Daily  . cefTRIAXone  1 g Intravenous Q24H  . docusate sodium  100 mg Oral BID  . famotidine  20 mg Oral BID  . latanoprost  1 drop Left Eye QHS  . levothyroxine  25 mcg Oral QAC breakfast  . timolol  2 drop Left Eye BID   Continuous Infusions: .  sodium bicarbonate  infusion 1000 mL 75 mL/hr at 06/09/16 1251    Assessment/Plan:  1. Acute kidney injury on chronic kidney disease stage III. Hold spironolactone and torsemide. Patient placed on bicarbonate drip. Continue to monitor kidney function daily. 2. Hyperkalemia secondary to acute kidney injury. This has improved from its level yesterday. 3. Elevated troponin. The second troponin was up at 2.03 the third troponin and forth troponin were 0.06. I believe that the 2.03 troponin was a lab error because the troponin would not come down and normalized quickly. I asked the lab to run it again and the repeat came back at 0.07. Stop heparin drip since the patient is not having any chest pain or shortness of breath. Echocardiogram. 4. Acute cystitis with hematuria. Start Rocephin. 5. Hypothyroidism unspecified on levothyroxine 6. Weakness. Physical therapy evaluation 7. Glaucoma on timolol 8. Poor appetite can be from uremia  Code Status:     Code Status Orders        Start     Ordered   06/08/16 1730  Do not attempt  resuscitation (DNR)  Continuous    Question Answer Comment  In the event of cardiac or respiratory ARREST Do not call a "code blue"   In the event of cardiac or respiratory ARREST Do not perform Intubation, CPR, defibrillation or ACLS   In the event of cardiac or respiratory ARREST Use medication by any route, position, wound care, and other measures to relive pain and suffering. May use oxygen, suction and manual treatment of airway obstruction as needed for comfort.      06/08/16 1729    Code Status History    Date Active Date Inactive Code Status Order ID Comments User Context   10/27/2015  7:51 PM 10/31/2015  6:52 PM Full Code [id number]  Milagros Loll, MD ED    Advance Directive Documentation   Flowsheet Row Most Recent Value  Type of Advance Directive  Healthcare Power of Attorney  Pre-existing out of facility DNR order (yellow form or pink MOST form)  No data  "MOST" Form in Place?  No data     Disposition Plan: To be determined based on physical therapy evaluation  Consultants:  Cardiology  Nephrology  Antibiotics:  Rocephin  Time spent: 25 minutes  Alford Highland  [employer]

## 2016-06-09 NOTE — Evaluation (Signed)
Physical Therapy Evaluation Patient Details Name: Heather Zuniga MRN: [id number] DOB: [birth year] Today's Date: 06/09/2016   History of Present Illness  Pt admitted for ARF. Pt with complaints of weakness and has possible NSTEMI. Pt with history of peripheral edema, anemia, CKD, Depression, and GERD. Pt with trending down troponin.  Clinical Impression  Pt is a pleasant 82 year old female who was admitted for ARF. Pt performs bed mobility with max assist to sit at EOB and is unable to perform further transfers/ambulation. Pt demonstrates deficits with strength/endurance/mobility. Pt confused at this time thinking she is at her ALF. Takes +2 assist for sliding up towards HOB after mobility. Pt is currently not at baseline level at this time. Would benefit from skilled PT to address above deficits and promote optimal return to PLOF. recommend transition to STR upon discharge from acute hospitalization.       Follow Up Recommendations SNF    Equipment Recommendations       Recommendations for Other Services       Precautions / Restrictions Precautions Precautions: Fall Restrictions Weight Bearing Restrictions: No      Mobility  Bed Mobility Overal bed mobility: Needs Assistance;+2 for physical assistance Bed Mobility: Supine to Sit     Supine to sit: Max assist     General bed mobility comments: assist for initiating movement and coming to sit at EOB. Pt with poor trunk control and with heavy posterior leaning. Unable to tolerate EOB sitting longer than 2-3 minutes. +2 assist for scooting back up towards [facility]  Transfers                 General transfer comment: unable to perform at this time  Ambulation/Gait                Stairs            Wheelchair Mobility    Modified Rankin (Stroke Patients Only)       Balance Overall balance assessment: Needs assistance Sitting-balance support: Feet supported Sitting balance-Leahy Scale: Zero                                        Pertinent Vitals/Pain Pain Assessment: No/denies pain    Home Living Family/patient expects to be discharged to:: Assisted living               Home Equipment: Walker - 2 wheels      Prior Function Level of Independence: Independent with assistive device(s)         Comments: Patient has been ambulating to and from the dining hall at ALF.      Hand Dominance        Extremity/Trunk Assessment   Upper Extremity Assessment: Generalized weakness (B UE grossly 3+/5)           Lower Extremity Assessment: Generalized weakness (B LE grossly 3/5)         Communication   Communication: No difficulties  Cognition Arousal/Alertness: Lethargic Behavior During Therapy: WFL for tasks assessed/performed Overall Cognitive Status: Difficult to assess                      General Comments      Exercises Other Exercises Other Exercises: Supine ther-ex performed including SLR, ankle pumps, and shoulder flexion. All ther-ex performed x 10 reps with cga and cues for correct technique.   Assessment/Plan  PT Assessment Patient needs continued PT services  PT Problem List Decreased strength;Decreased balance;Decreased mobility          PT Treatment Interventions DME instruction;Gait training;Therapeutic activities;Therapeutic exercise    PT Goals (Current goals can be found in the Care Plan section)  Acute Rehab PT Goals Patient Stated Goal: to go back to ALF PT Goal Formulation: With patient Time For Goal Achievement: 06/23/16 Potential to Achieve Goals: Good    Frequency Min 2X/week   Barriers to discharge        Co-evaluation               End of Session   Activity Tolerance: Patient limited by fatigue;Patient limited by lethargy Patient left: in bed;with bed alarm set Nurse Communication: Mobility status         Time: 4008-6761 PT Time Calculation (min) (ACUTE ONLY): 20 min   Charges:    PT Evaluation $PT Eval Moderate Complexity: 1 Procedure PT Treatments $Therapeutic Exercise: 8-22 mins   PT G Codes:        [name] 07/05/2016, 4:59 PM  [name], PT, DPT [phone]

## 2016-06-09 NOTE — Progress Notes (Signed)
Patient was transferred from 1A following elevated Troponin. On admission patient was A&O X4, denied pain or being in any discomfort. Patient and daughter was oriented to the unit, and educated the purpose of the transfer. Cardiac monitor was initiated, and dual verified.  Patient remained NSR with VS WDL for patient, she rested for most of the night and assisted as needed.

## 2016-06-09 NOTE — Progress Notes (Signed)
Nutrition Follow-up  DOCUMENTATION CODES:   Severe malnutrition in context of chronic illness  INTERVENTION:  -Ensure Enlive po BID, each supplement provides 350 kcal and 20 grams of protein  NUTRITION DIAGNOSIS:   Malnutrition related to chronic illness as evidenced by percent weight loss, moderate depletion of body fat, moderate to severe fluid accumulation.  GOAL:   Patient will meet greater than or equal to 90% of their needs  MONITOR:   PO intake, I & O's, Labs, Weight trends, Supplement acceptance  REASON FOR ASSESSMENT:   Consult Assessment of nutrition requirement/status  ASSESSMENT:   Heather Zuniga  is a 82 y.o. female with a known history of Peripheral edema, anemia and the CKD stage III. The patient was sent from assisted living to the ED due to generalized weakness, poor oral intake and the failure to thrive by her family  Spoke with Ms. Herschberger at bedside. She endorses getting "really heavy," and has since tried to lose weight by limiting her PO intake.  She states at her assisted living facility she started out eating a ton of sweets and eventually had to stop after she gained a lot of weight.  Per chart she was 218# as of 06/2015 and 214# as of 01/2015. She exhibits a 30#/14.4% severe wt loss over 7 months at this time. Family mentioned failure to thrive and poor PO upon admission, while pt states she was trying to lose weight.  Completed RD exam, pt exhibits moderate muscle wasting at arms, acromion and clavicles, moderate fat depletions at triceps, moderate-severe edema in lower extremities, no appearance of muscle loss.  She states she ate most of her breakfast this morning but she is unsure of weight her breakfast consisted of. 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 Yes; Fluid consistency: Thin  Skin:  Reviewed, no issues  Last BM:  11/1  Height:   Ht Readings from Last 1 Encounters:  06/08/16 5\' 1"  (1.549 m)    Weight:   Wt Readings from Last 1 Encounters:  06/08/16 178 lb (80.7 kg)    Ideal Body Weight:  47.72 kg  BMI:  Body mass index is 33.63 kg/m.  Estimated Nutritional Needs:   Kcal:  1400-1700 calories  Protein:  80-96 gm  Fluid:  >/= 1.4L  EDUCATION NEEDS:   No education needs identified at this time  06/10/16. Ranisha Allaire, MS, RD LDN Inpatient Clinical Dietitian Pager [phone]

## 2016-06-09 NOTE — Consult Note (Signed)
[facility] Cardiology Consultation Note  Patient ID: Heather Zuniga, MRN: [id number], DOB/AGE: 1923-05-04 82 y.o. Admit date: 06/08/2016   Date of Consult: 06/09/2016 Primary Physician: No primary care provider on file. Primary Cardiologist: None  Chief Complaint:  Chief Complaint  Patient presents with  . Weakness   Reason for Consult: weakness fatigue  HPI: 82 y.o. female with the no evidence of previous cardiovascular history with apparent chronic kidney disease stage IV and some lower extremity edema with significant worsening episodes of weakness and fatigue and unable to do what she wants to do and failure to thrive over a 2 to three-day period. The patient has been seen in the emergency room at that time had no evidence of congestive heart failure but has some chronic lower extremity edema possibly due to venous insufficiency. The patient also has had elevated troponin of 2 possibly consistent with non-ST elevation myocardial infarction. This may be some of the cause of her current symptoms. Currently the patient is weak and fatigue but no evidence of shortness of breath lying flat and no evidence of angina at this time  Past Medical History:  Diagnosis Date  . Anemia   . Arthritis   . CKD (chronic kidney disease) stage 3, GFR 30-59 ml/min   . Depression   . Edema   . GERD (gastroesophageal reflux disease)   . Pyuria       Surgical History: History reviewed. No pertinent surgical history.   Home Meds: Prior to Admission medications   Medication Sig Start Date End Date Taking? Authorizing Provider  acetaminophen (TYLENOL) 650 MG CR tablet Take 1,300 mg by mouth 2 (two) times daily.   Yes Historical Provider, MD  albuterol (PROVENTIL HFA;VENTOLIN HFA) 108 (90 Base) MCG/ACT inhaler Inhale 2 puffs into the lungs every 4 (four) hours as needed for wheezing or shortness of breath.   Yes Historical Provider, MD  latanoprost (XALATAN) 0.005 % ophthalmic solution Place 1 drop into  the left eye at bedtime.    Yes Historical Provider, MD  levothyroxine (SYNTHROID, LEVOTHROID) 25 MCG tablet Take 25 mcg by mouth daily before breakfast.   Yes Historical Provider, MD  Multiple Vitamin (MULTIVITAMIN WITH MINERALS) TABS tablet Take 1 tablet by mouth daily.   Yes Historical Provider, MD  ranitidine (ZANTAC) 150 MG tablet Take 150 mg by mouth 2 (two) times daily.   Yes Historical Provider, MD  spironolactone (ALDACTONE) 25 MG tablet Take 25 mg by mouth daily.    Yes Historical Provider, MD  timolol (TIMOPTIC) 0.5 % ophthalmic solution Place 2 drops into the left eye 2 (two) times daily.    Yes Historical Provider, MD  torsemide (DEMADEX) 20 MG tablet Take 20 mg by mouth daily.    Yes Historical Provider, MD  white petrolatum (SM PETROLEUM JELLY) GEL Apply 1 application topically 2 (two) times daily. Apply to bilateral lower extremities twice a day   Yes Historical Provider, MD    Inpatient Medications:  . aspirin  81 mg Oral Daily  . docusate sodium  100 mg Oral BID  . famotidine  20 mg Oral BID  . latanoprost  1 drop Left Eye QHS  . levothyroxine  25 mcg Oral QAC breakfast  . timolol  2 drop Left Eye BID   .  sodium bicarbonate  infusion 1000 mL 75 mL/hr at 06/09/16 1251    Allergies: No Known Allergies  Social History   Social History  . Marital status: Divorced    Spouse name: N/A  .  Number of children: N/A  . Years of education: N/A   Occupational History  . Not on file.   Social History Main Topics  . Smoking status: Never Smoker  . Smokeless tobacco: Never Used  . Alcohol use No  . Drug use: No  . Sexual activity: Not on file   Other Topics Concern  . Not on file   Social History Narrative  . No narrative on file     Family History  Problem Relation Age of Onset  . Rheum arthritis Other      Review of Systems Positive for Weakness fatigue Negative for: General:  chills, fever, night sweats or weight changes.  Cardiovascular: PND orthopnea  syncope dizziness  Dermatological skin lesions rashes Respiratory: Cough congestion Urologic: Frequent urination urination at night and hematuria Abdominal: negative for nausea, vomiting, diarrhea, bright red blood per rectum, melena, or hematemesis Neurologic: negative for visual changes, and/or hearing changes  All other systems reviewed and are otherwise negative except as noted above.  Labs:  Recent Labs  06/08/16 1435 06/08/16 1806 Jul 02, 2016 0156 07/02/16 0759  TROPONINI 0.08* 2.03* 0.06* 0.06*   Lab Results  Component Value Date   WBC 11.8 (H) July 02, 2016   HGB 8.0 (L) 02-Jul-2016   HCT 24.9 (L) Jul 02, 2016   MCV 99.0 07/02/16   PLT 212 02-Jul-2016    Recent Labs Lab 06/08/16 1435  07-02-16 0156  NA 137  < > 142  K 6.8*  < > 5.2*  CL 111  < > 114*  CO2 17*  < > 19*  BUN 105*  < > 97*  CREATININE 3.35*  < > 2.75*  CALCIUM 8.7*  < > 8.3*  PROT 8.4*  --   --   BILITOT 0.6  --   --   ALKPHOS 338*  --   --   ALT 26  --   --   AST 47*  --   --   GLUCOSE 132*  < > 122*  < > = values in this interval not displayed. Lab Results  Component Value Date   CHOL 154 06/27/2015   HDL 55 06/27/2015   LDLCALC 73 06/27/2015   No results found for: DDIMER  Radiology/Studies:  US Renal  Result Date: 02-Jul-2016 CLINICAL DATA:  Acute renal failure EXAM: RENAL / URINARY TRACT ULTRASOUND COMPLETE COMPARISON:  None FINDINGS: Right Kidney: Length: 8.9 cm. Cortical thinning. Upper normal cortical echogenicity. Small lobulated cyst medially at mid RIGHT kidney 2.0 x 1.7 x 4.9 cm. Additional tiny cyst lateral RIGHT kidney 11 x 10 x 11 mm. No additional mass, hydronephrosis, or shadowing calcification Left Kidney: Length: 8.0 cm. Cortical thinning. Upper normal cortical echogenicity. No mass, hydronephrosis, or shadowing calcification. Bladder: Appears normal for degree of bladder distention. Mild to moderate lower abdominal ascites greater on RIGHT. Gallbladder filled with multiple  shadowing calculi. IMPRESSION: Atrophic appearing kidneys with small RIGHT renal cyst. Otherwise unremarkable renal ultrasound. Mild to moderate lower abdominal ascites. Cholelithiasis. Electronically Signed   By: Ulyses Southward M.D.   On: 2016-07-02 09:17   Dg Chest Portable 1 View  Result Date: 06/08/2016 CLINICAL DATA:  Weakness and failure to thrive. EXAM: PORTABLE CHEST 1 VIEW COMPARISON:  10/27/2015 FINDINGS: Cardiac silhouette remains upper limits of normal in size. No airspace consolidation, edema, pleural effusion, or pneumothorax is seen. No acute osseous abnormality is identified. IMPRESSION: No active disease. Electronically Signed   By: Sebastian Ache M.D.   On: 06/08/2016 16:06     Weights:  Filed Weights   06/08/16 1417  Weight: 80.7 kg (178 lb)     Physical Exam: Blood pressure (!) 108/54, pulse 82, temperature 98.2 F (36.8 C), temperature source Oral, resp. rate 12, height 5\' 1"  (1.549 m), weight 80.7 kg (178 lb), SpO2 95 %. Body mass index is 33.63 kg/m. General: Well developed, well nourished, in no acute distress. Head eyes ears nose throat: Normocephalic, atraumatic, sclera non-icteric, no xanthomas, nares are without discharge. No apparent thyromegaly and/or mass  Lungs: Normal respiratory effort.  no wheezes, few rales, no rhonchi.  Heart: RRR with normal S1 S2. no murmur gallop, no rub, PMI is normal size and placement, carotid upstroke normal without bruit, jugular venous pressure is normal Abdomen: Soft, non-tender, non-distended with normoactive bowel sounds. No hepatomegaly. No rebound/guarding. No obvious abdominal masses. Abdominal aorta is normal size without bruit Extremities: 1+ edema. no cyanosis, no clubbing, no ulcers  Peripheral : 2+ bilateral upper extremity pulses, 2+ bilateral femoral pulses, 2+ bilateral dorsal pedal pulse Neuro: Alert and oriented. No facial asymmetry. No focal deficit. Moves all extremities spontaneously. Musculoskeletal: Normal  muscle tone with kyphosis Psych:  Responds to questions appropriately with a normal affect.    Assessment: 82 year old female with chronic kidney disease stage IV lower extremity edema and elevated troponin consistent with possible non-ST elevation myocardial infarction  Plan: 1. Continue serial ECG and enzymes to assess for possibility of a myocardial infarction 2. Treatment with hydration for the possibility of chronic kidney disease exacerbation and renal failure 3. Echocardiogram for LV systolic dysfunction valvular heart disease contributing to above 4. Further diagnostic testing and treatment options after above  Signed, 84 M.D. [facility] Cardiology 06/09/2016, 12:52 PM

## 2016-06-09 NOTE — Consult Note (Signed)
ANTICOAGULATION CONSULT NOTE - Initial Consult  Pharmacy Consult for heparin drip Indication: chest pain/ACS  No Known Allergies  Patient Measurements: Height: 5\' 1"  (154.9 cm) Weight: 178 lb (80.7 kg) IBW/kg (Calculated) : 47.8 Heparin Dosing Weight: 66kg  Vital Signs: Temp: 98.2 F (36.8 C) (11/01 0352) Temp Source: Oral (11/01 0352) BP: 113/54 (11/01 0352) Pulse Rate: 70 (11/01 0352)  Labs:  Recent Labs  06/08/16 1435 06/08/16 1806 06/08/16 2031 06/09/16 0156 06/09/16 0440  HGB 8.9*  --   --  8.0*  --   HCT 28.8*  --   --  24.9*  --   PLT 229  --   --  212  --   APTT  --   --  35  --   --   LABPROT  --   --  16.1*  --   --   INR  --   --  1.28  --   --   HEPARINUNFRC  --   --   --   --  0.90*  CREATININE 3.35* 3.11*  --  2.75*  --   TROPONINI 0.08* 2.03*  --  0.06*  --     Estimated Creatinine Clearance: 12.8 mL/min (by C-G formula based on SCr of 2.75 mg/dL (H)).   Medical History: Past Medical History:  Diagnosis Date  . Anemia   . Arthritis   . CKD (chronic kidney disease) stage 3, GFR 30-59 ml/min   . Depression   . Edema   . GERD (gastroesophageal reflux disease)   . Pyuria     Medications:  Scheduled:  . aspirin  81 mg Oral Daily  . docusate sodium  100 mg Oral BID  . famotidine  20 mg Oral BID  . latanoprost  1 drop Left Eye QHS  . levothyroxine  25 mcg Oral QAC breakfast  . timolol  2 drop Left Eye BID    Assessment: Pt is a 82 year old female admitted with failure to thrive, found to have repeat elevated troponin. Pharmacy consulted for heparin drip. No home anticoagulants. Add on INR and APTT added.  Goal of Therapy:  Heparin level 0.3-0.7 units/ml Monitor platelets by anticoagulation protocol: Yes   Plan:  Give 3950 units bolus x 1 Start heparin infusion at 800 units/hr Check anti-Xa level in 8 hours and daily while on heparin Continue to monitor H&H and platelets   11/1 04:45 heparin level 0.90. Decrease to 650 units/hr.  Recheck in 8 hours.  Josely Moffat S 06/09/2016,6:08 AM

## 2016-06-09 NOTE — Clinical Social Work Note (Signed)
Patient is a resident at [facility], and the plan is for her to return pending PT recommendations.  MSW has contacted [facility] and spoke to [place] at [phone] and confirmed patient is a residen tat ALF.  MSW to continue to follow patient's progress throughout discharge planning, formal assessment to follow.  Ervin Knack. Sheriece Jefcoat, MSW [phone]  Mon-Fri 8a-4:30p 06/09/2016 1:27 PM

## 2016-06-09 NOTE — Care Management (Addendum)
Patient is admitted rom [facility].  There is mention of failure to thrive in the history and physical. She is a DNR.  Possibly may benefit from palliative care services for goals of care.  Patient is currently being followed by [facility] nursing and physical therapy.  Agency is aware of admission

## 2016-06-09 NOTE — Consult Note (Signed)
Date: 06/09/2016                  Patient Name:  Heather Zuniga  MRN: [id number]  DOB: July 16, 1923  Age / Sex: 82 y.o., female         PCP: No primary care provider on file.                 Service Requesting Consult: Internal medicine                 Reason for Consult: Acute renal failure and hyperkalemia            History of Present Illness: Patient is a 82 y.o. female with medical problems of chronic kidney disease, arthritis, chronic edemawho was admitted to [facility] on 06/08/2016 for evaluation of generalized weakness and poor oral intake.  Upon admission, she was noted to have increased creatinine of 3.35.  Her baseline creatinine is 1.67 from July 2017.  Also, her potassium was noted to be critically elevated at 6.8.  Patient was admitted for further evaluation and management.  Patient was given shifting measures and treated with Kayexalate.  Her potassium level has come down to 5.2 this morning.  Her serum creatinine has also improved with IV hydration to 2.75  Patient was taking spironolactone.  That has been discontinued now. Patient states that normally she is able to walk with a walker.  She states that she has been told that she has not been eating properly but she just eats less than most people.    Medications: Outpatient medications: Prescriptions Prior to Admission  Medication Sig Dispense Refill Last Dose  . acetaminophen (TYLENOL) 650 MG CR tablet Take 1,300 mg by mouth 2 (two) times daily.   06/08/2016 at 0800  . albuterol (PROVENTIL HFA;VENTOLIN HFA) 108 (90 Base) MCG/ACT inhaler Inhale 2 puffs into the lungs every 4 (four) hours as needed for wheezing or shortness of breath.   prn at prn  . latanoprost (XALATAN) 0.005 % ophthalmic solution Place 1 drop into the left eye at bedtime.    06/07/2016 at 2000  . levothyroxine (SYNTHROID, LEVOTHROID) 25 MCG tablet Take 25 mcg by mouth daily before breakfast.   06/08/2016 at 0800  . Multiple Vitamin (MULTIVITAMIN WITH  MINERALS) TABS tablet Take 1 tablet by mouth daily.   06/08/2016 at 0800  . ranitidine (ZANTAC) 150 MG tablet Take 150 mg by mouth 2 (two) times daily.   06/08/2016 at 0800  . spironolactone (ALDACTONE) 25 MG tablet Take 25 mg by mouth daily.    06/08/2016 at 0800  . timolol (TIMOPTIC) 0.5 % ophthalmic solution Place 2 drops into the left eye 2 (two) times daily.    06/08/2016 at 0800  . torsemide (DEMADEX) 20 MG tablet Take 20 mg by mouth daily.    06/08/2016 at 0800  . white petrolatum (SM PETROLEUM JELLY) GEL Apply 1 application topically 2 (two) times daily. Apply to bilateral lower extremities twice a day   06/08/2016 at 0800    Current medications: Current Facility-Administered Medications  Medication Dose Route Frequency Provider Last Rate Last Dose  . acetaminophen (TYLENOL) tablet 650 mg  650 mg Oral Q6H PRN Shaune Pollack, MD       Or  . acetaminophen (TYLENOL) suppository 650 mg  650 mg Rectal Q6H PRN Shaune Pollack, MD      . albuterol (PROVENTIL) (2.5 MG/3ML) 0.083% nebulizer solution 2.5 mg  2.5 mg Nebulization Q2H PRN Shaune Pollack, MD      .  albuterol (PROVENTIL) (2.5 MG/3ML) 0.083% nebulizer solution 2.5 mg  2.5 mg Nebulization Q4H PRN Shaune Pollack, MD      . aspirin chewable tablet 81 mg  81 mg Oral Daily Shaune Pollack, MD   81 mg at 06/09/16 0945  . bisacodyl (DULCOLAX) EC tablet 5 mg  5 mg Oral Daily PRN Shaune Pollack, MD      . cefTRIAXone (ROCEPHIN) IVPB 1 g  1 g Intravenous Q24H Alford Highland, MD      . docusate sodium (COLACE) capsule 100 mg  100 mg Oral BID Shaune Pollack, MD   100 mg at 06/09/16 0945  . famotidine (PEPCID) tablet 20 mg  20 mg Oral BID Shaune Pollack, MD   20 mg at 06/09/16 0945  . latanoprost (XALATAN) 0.005 % ophthalmic solution 1 drop  1 drop Left Eye QHS Shaune Pollack, MD   1 drop at 06/08/16 2140  . levothyroxine (SYNTHROID, LEVOTHROID) tablet 25 mcg  25 mcg Oral QAC breakfast Shaune Pollack, MD   25 mcg at 06/09/16 0945  . ondansetron (ZOFRAN) tablet 4 mg  4 mg Oral Q6H PRN Shaune Pollack, MD        Or  . ondansetron [facility]) injection 4 mg  4 mg Intravenous Q6H PRN Shaune Pollack, MD      . senna-docusate (Senokot-S) tablet 1 tablet  1 tablet Oral QHS PRN Shaune Pollack, MD      . sodium bicarbonate 100 mEq in dextrose 5 % 1,000 mL infusion   Intravenous Continuous Shaune Pollack, MD 75 mL/hr at 06/09/16 1251    . timolol (TIMOPTIC) 0.5 % ophthalmic solution 2 drop  2 drop Left Eye BID Shaune Pollack, MD   2 drop at 06/09/16 2585      Allergies: No Known Allergies    Past Medical History: Past Medical History:  Diagnosis Date  . Anemia   . Arthritis   . CKD (chronic kidney disease) stage 3, GFR 30-59 ml/min   . Depression   . Edema   . GERD (gastroesophageal reflux disease)   . Pyuria      Past Surgical History: History reviewed. No pertinent surgical history.   Family History: Family History  Problem Relation Age of Onset  . Rheum arthritis Other      Social History: Social History   Social History  . Marital status: Divorced    Spouse name: N/A  . Number of children: N/A  . Years of education: N/A   Occupational History  . Not on file.   Social History Main Topics  . Smoking status: Never Smoker  . Smokeless tobacco: Never Used  . Alcohol use No  . Drug use: No  . Sexual activity: Not on file   Other Topics Concern  . Not on file   Social History Narrative  . No narrative on file     Review of Systems: Gen: no fevers, chills HEENT: no hearing problems, no sore throat CV: no chest pain Resp: no shortness of breath or sputum or coughing GI: appetite is okay.  Patient stated she just eats very little and her age GU : no problems reported with voiding.  No blood in the urine MS: able to ambulate with a walker Derm:  no complaints Psych:no complaints Heme: no complaints Neuro: generalized weakness Endocrine.  No complaints  Vital Signs: Blood pressure (!) 108/54, pulse 82, temperature 98.2 F (36.8 C), temperature source Oral, resp. rate 12,  height 5\' 1"  (1.549 m), weight 80.7 kg (178 lb),  SpO2 95 %.   Intake/Output Summary (Last 24 hours) at 06/09/16 1432 Last data filed at 06/09/16 1350  Gross per 24 hour  Intake           904.08 ml  Output              400 ml  Net           504.08 ml    Weight trends: [employer]   06/08/16 1417  Weight: 80.7 kg (178 lb)    Physical Exam: General:  at the moment, laying in the bed  HEENT Anicteric, moist mucous membranes  Neck:  supple  Lungs: Normal breathing effort, clear  Heart::  no rub or  gallops  Abdomen: Soft, nondistended, nontender  Extremities:  trace peripheral edema  Neurologic: Alert, able to answer most questions  Skin: No acute rashes             Lab results: Basic Metabolic Panel:  Recent Labs Lab 06/08/16 1435 06/08/16 1806 06/09/16 0156  NA 137 141 142  K 6.8* 6.7* 5.2*  CL 111 114* 114*  CO2 17* 19* 19*  GLUCOSE 132* 104* 122*  BUN 105* 100* 97*  CREATININE 3.35* 3.11* 2.75*  CALCIUM 8.7* 8.4* 8.3*  MG  --  2.9*  --   PHOS  --  3.9  --     Liver Function Tests:  Recent Labs Lab 06/08/16 1435  AST 47*  ALT 26  ALKPHOS 338*  BILITOT 0.6  PROT 8.4*  ALBUMIN 3.6   No results for input(s): LIPASE, AMYLASE in the last 168 hours. No results for input(s): AMMONIA in the last 168 hours.  CBC:  Recent Labs Lab 06/08/16 1435 06/09/16 0156  WBC 12.1* 11.8*  NEUTROABS 8.6*  --   HGB 8.9* 8.0*  HCT 28.8* 24.9*  MCV 101.9* 99.0  PLT 229 212    Cardiac Enzymes:  Recent Labs Lab 06/09/16 0759  TROPONINI 0.06*    BNP: Invalid input(s): POCBNP  CBG: No results for input(s): GLUCAP in the last 168 hours.  Microbiology: No results found for this or any previous visit (from the past 720 hour(s)).   Coagulation Studies:  Recent Labs  06/08/16 2031  LABPROT 16.1*  INR 1.28    Urinalysis:  Recent Labs  06/08/16 1436  COLORURINE AMBER*  LABSPEC 1.014  PHURINE 6.0  GLUCOSEU NEGATIVE  HGBUR 1+*  BILIRUBINUR  NEGATIVE  KETONESUR NEGATIVE  PROTEINUR 30*  NITRITE NEGATIVE  LEUKOCYTESUR 1+*        Imaging: US Renal  Result Date: 06/09/2016 CLINICAL DATA:  Acute renal failure EXAM: RENAL / URINARY TRACT ULTRASOUND COMPLETE COMPARISON:  None FINDINGS: Right Kidney: Length: 8.9 cm. Cortical thinning. Upper normal cortical echogenicity. Small lobulated cyst medially at mid RIGHT kidney 2.0 x 1.7 x 4.9 cm. Additional tiny cyst lateral RIGHT kidney 11 x 10 x 11 mm. No additional mass, hydronephrosis, or shadowing calcification Left Kidney: Length: 8.0 cm. Cortical thinning. Upper normal cortical echogenicity. No mass, hydronephrosis, or shadowing calcification. Bladder: Appears normal for degree of bladder distention. Mild to moderate lower abdominal ascites greater on RIGHT. Gallbladder filled with multiple shadowing calculi. IMPRESSION: Atrophic appearing kidneys with small RIGHT renal cyst. Otherwise unremarkable renal ultrasound. Mild to moderate lower abdominal ascites. Cholelithiasis. Electronically Signed   By: Ulyses Southward M.D.   On: 06/09/2016 09:17   Dg Chest Portable 1 View  Result Date: 06/08/2016 CLINICAL DATA:  Weakness and failure to thrive. EXAM: PORTABLE CHEST 1  VIEW COMPARISON:  10/27/2015 FINDINGS: Cardiac silhouette remains upper limits of normal in size. No airspace consolidation, edema, pleural effusion, or pneumothorax is seen. No acute osseous abnormality is identified. IMPRESSION: No active disease. Electronically Signed   By: Sebastian Ache M.D.   On: 06/08/2016 16:06      Assessment & Plan: Pt is a 82 y.o. African American female with arthritis, chronic edema, chronic kidney disease, was admitted on 06/08/2016 with generalized weakness and is found to have acute renal failure and hyperkalemia.   1.  Acute renal failure Renal ultrasound shows atrophic appearing kidneys.  No evidence of obstruction Serum creatinine has started to improve with IV hydrations  2.  Severe  hyperkalemia Spironolactone has been discontinued Low potassium diet  3.  Chronic edema Consider obtaining an echocardiogram  Will follow

## 2016-06-10 DIAGNOSIS — Z515 Encounter for palliative care: Secondary | ICD-10-CM

## 2016-06-10 DIAGNOSIS — Z7189 Other specified counseling: Secondary | ICD-10-CM

## 2016-06-10 DIAGNOSIS — L8990 Pressure ulcer of unspecified site, unspecified stage: Secondary | ICD-10-CM | POA: Insufficient documentation

## 2016-06-10 DIAGNOSIS — N179 Acute kidney failure, unspecified: Principal | ICD-10-CM

## 2016-06-10 DIAGNOSIS — R531 Weakness: Secondary | ICD-10-CM

## 2016-06-10 LAB — ECHOCARDIOGRAM COMPLETE
HEIGHTINCHES: 61 in
WEIGHTICAEL: 2848 [oz_av]

## 2016-06-10 LAB — BASIC METABOLIC PANEL
ANION GAP: 6 (ref 5–15)
BUN: 70 mg/dL — ABNORMAL HIGH (ref 6–20)
CALCIUM: 7.7 mg/dL — AB (ref 8.9–10.3)
CO2: 27 mmol/L (ref 22–32)
Chloride: 109 mmol/L (ref 101–111)
Creatinine, Ser: 1.91 mg/dL — ABNORMAL HIGH (ref 0.44–1.00)
GFR, EST AFRICAN AMERICAN: 25 mL/min — AB (ref >60)
GFR, EST NON AFRICAN AMERICAN: 22 mL/min — AB (ref >60)
Glucose, Bld: 130 mg/dL — ABNORMAL HIGH (ref 65–99)
Potassium: 4.1 mmol/L (ref 3.5–5.1)
SODIUM: 142 mmol/L (ref 135–145)

## 2016-06-10 MED ORDER — MIRTAZAPINE 15 MG PO TABS
7.5000 mg | ORAL_TABLET | Freq: Every day | ORAL | Status: DC
  Administered 2016-06-10: 7.5 mg via ORAL
  Filled 2016-06-10: qty 1

## 2016-06-10 MED ORDER — FAMOTIDINE 20 MG PO TABS
20.0000 mg | ORAL_TABLET | Freq: Every day | ORAL | Status: DC
Start: 2016-06-11 — End: 2016-06-11
  Administered 2016-06-11: 20 mg via ORAL
  Filled 2016-06-10: qty 1

## 2016-06-10 NOTE — NC FL2 (Signed)
[place] MEDICAID FL2 LEVEL OF CARE SCREENING TOOL     IDENTIFICATION  Patient Name: Heather Zuniga Birthdate: 08/13/1922 Sex: female Admission Date (Current Location): 06/08/2016  [place] and [place] Number:  Chiropodist and Address:  [facility], [address] [place], [place] [address]      Provider Number: [id number]  Attending Physician Name and Address:  Alford Highland, MD  Relative Name and Phone Number:       Current Level of Care: Hospital Recommended Level of Care: Skilled Nursing Facility Prior Approval Number:    Date Approved/Denied:   PASRR Number: [id number]  Discharge Plan: SNF    Current Diagnoses: Patient Active Problem List   Diagnosis Date Noted  . Pressure injury of skin 06/10/2016  . Renal failure (ARF), acute on chronic (HCC) 06/08/2016  . UTI (lower urinary tract infection) 10/27/2015  . GERD (gastroesophageal reflux disease) 06/09/2015  . Obesity 06/09/2015  . Arthritis 06/09/2015  . Edema 06/09/2015  . Stasis leg ulcer (HCC) 06/09/2015  . Absolute anemia 03/23/2014  . Chronic kidney disease (CKD), stage III (moderate) 03/23/2014  . Arthritis, degenerative 03/23/2014    Orientation RESPIRATION BLADDER Height & Weight     Self, Situation, Place  Normal Incontinent Weight: 178 lb (80.7 kg) Height:  5\' 1"  (154.9 cm)  BEHAVIORAL SYMPTOMS/MOOD NEUROLOGICAL BOWEL NUTRITION STATUS   (none)  (none) Incontinent Diet (heart healthy)  AMBULATORY STATUS COMMUNICATION OF NEEDS Skin   Extensive Assist Verbally PU Stage and Appropriate Care                       Personal Care Assistance Level of Assistance  Bathing, Dressing Bathing Assistance: Limited assistance   Dressing Assistance: Limited assistance     Functional Limitations Info   (none)          SPECIAL CARE FACTORS FREQUENCY  PT (By licensed PT)    5x a week                Contractures Contractures Info: Not present     Additional Factors Info  Code Status, Allergies Code Status Info: dnr Allergies Info: nka           Current Medications (06/10/2016):  This is the current hospital active medication list Current Facility-Administered Medications  Medication Dose Route Frequency Provider Last Rate Last Dose  . acetaminophen (TYLENOL) tablet 650 mg  650 mg Oral Q6H PRN 13/09/2015, MD       Or  . acetaminophen (TYLENOL) suppository 650 mg  650 mg Rectal Q6H PRN Shaune Pollack, MD      . albuterol (PROVENTIL) (2.5 MG/3ML) 0.083% nebulizer solution 2.5 mg  2.5 mg Nebulization Q2H PRN Shaune Pollack, MD      . albuterol (PROVENTIL) (2.5 MG/3ML) 0.083% nebulizer solution 2.5 mg  2.5 mg Nebulization Q4H PRN Shaune Pollack, MD      . aspirin chewable tablet 81 mg  81 mg Oral Daily Shaune Pollack, MD   81 mg at 06/10/16 0848  . bisacodyl (DULCOLAX) EC tablet 5 mg  5 mg Oral Daily PRN 13/02/17, MD      . cefTRIAXone (ROCEPHIN) IVPB 1 g  1 g Intravenous Q24H Shaune Pollack, MD   1 g at 06/10/16 1445  . docusate sodium (COLACE) capsule 100 mg  100 mg Oral BID 13/02/17, MD   100 mg at 06/10/16 0847  . [START ON 06/11/2016] famotidine (PEPCID) tablet 20 mg  20 mg Oral  Daily Alford Highland, MD      . heparin injection 5,000 Units  5,000 Units Subcutaneous Q8H Alford Highland, MD   5,000 Units at 06/10/16 1444  . latanoprost (XALATAN) 0.005 % ophthalmic solution 1 drop  1 drop Left Eye QHS Shaune Pollack, MD   1 drop at 06/09/16 2120  . levothyroxine (SYNTHROID, LEVOTHROID) tablet 25 mcg  25 mcg Oral QAC breakfast Shaune Pollack, MD   25 mcg at 06/10/16 0847  . mirtazapine (REMERON) tablet 7.5 mg  7.5 mg Oral QHS Alford Highland, MD      . ondansetron [facility]) tablet 4 mg  4 mg Oral Q6H PRN Shaune Pollack, MD       Or  . ondansetron [facility]) injection 4 mg  4 mg Intravenous Q6H PRN Shaune Pollack, MD      . senna-docusate (Senokot-S) tablet 1 tablet  1 tablet Oral QHS PRN Shaune Pollack, MD      . sodium bicarbonate 100 mEq in dextrose 5 % 1,000 mL infusion    Intravenous Continuous Shaune Pollack, MD 75 mL/hr at 06/10/16 0308    . timolol (TIMOPTIC) 0.5 % ophthalmic solution 2 drop  2 drop Left Eye BID Shaune Pollack, MD   2 drop at 06/10/16 0848     Discharge Medications: Please see discharge summary for a list of discharge medications.  Relevant Imaging Results:  Relevant Lab Results:   Additional Information [id number]  York Spaniel, LCSW   (Updated Windell Moulding, MSW, 06-11-16) Ervin Knack. Kelani Robart, MSW [phone]  Mon-Fri 8a-4:30p 06/11/2016 9:07 AM

## 2016-06-10 NOTE — Clinical Social Work Placement (Addendum)
   CLINICAL SOCIAL WORK PLACEMENT  NOTE  Date:  06/10/2016  Patient Details  Name: Heather Zuniga MRN: [id number] Date of Birth: Apr 29, 1923  Clinical Social Work is seeking post-discharge placement for this patient at the Skilled  Nursing Facility level of care (*CSW will initial, date and re-position this form in  chart as items are completed):  Yes   Patient/family provided with [facility] Clinical Social Work Department's list of facilities offering this level of care within the geographic area requested by the patient (or if unable, by the patient's family).  Yes   Patient/family informed of their freedom to choose among providers that offer the needed level of care, that participate in Medicare, Medicaid or managed care program needed by the patient, have an available bed and are willing to accept the patient.  Yes   Patient/family informed of [place]'s ownership interest in [facility] and [facility], as well as of the fact that they are under no obligation to receive care at these facilities.  PASRR submitted to EDS on 06/10/16     PASRR number received on       Existing PASRR number confirmed on       FL2 transmitted to all facilities in geographic area requested by pt/family on 06/10/16     FL2 transmitted to all facilities within larger geographic area on       Patient informed that his/her managed care company has contracts with or will negotiate with certain facilities, including the following:         06-11-16   Patient/family informed of bed offers received. (updated Windell Moulding, MSW, 06-11-16)  Patient chooses bed at  [facility] (updated Windell Moulding, MSW, 06-11-16)     Physician recommends and patient chooses bed at      Patient to be transferred to  [facility] on  06-11-16 (updated Windell Moulding, MSW, 06-11-16).  Patient to be transferred to facility by  [facility] (updated Windell Moulding, MSW, 06-11-16)      Patient family notified on  06-11-16 of transfer. (updated Windell Moulding, MSW, 06-11-16)  Name of family member notified:    Philis Fendt patient's daughter [phone] or [phone]   PHYSICIAN Please sign FL2, Please sign DNR     Additional Comment:    _______________________________________________ [name] 06/10/2016, 4:40 PM

## 2016-06-10 NOTE — Progress Notes (Signed)
Subjective:   Patient is doing fair Denies any acute complaints Serum creatinine has improved to 1.91   Objective:  Vital signs in last 24 hours:  Temp:  [97.8 F (36.6 C)-99.1 F (37.3 C)] 97.8 F (36.6 C) (11/02 1127) Pulse Rate:  [80-90] 80 (11/02 1127) Resp:  [17-18] 17 (11/02 1127) BP: (101-123)/(57-70) 101/70 (11/02 1127) SpO2:  [95 %-98 %] 95 % (11/02 1127)  Weight change:  Filed Weights   06/08/16 1417  Weight: 80.7 kg (178 lb)    Intake/Output:    Intake/Output Summary (Last 24 hours) at 06/10/16 1216 Last data filed at 06/10/16 0200  Gross per 24 hour  Intake             1500 ml  Output                0 ml  Net             1500 ml     Physical Exam: General: No acute distress, laying in the bed   HEENT moist oral mucous membranes   Neck Supple   Pulm/lungs Normal effort, scattered rhonchi   CVS/Heart Irregular   Abdomen:  Soft, mildly distended   Extremities: 1+ dependent edema   Neurologic: Alert, follows commands   Skin: Normal turgor           Basic Metabolic Panel:   Recent Labs Lab 06/08/16 1435 06/08/16 1806 06/09/16 0156 06/10/16 0809  NA 137 141 142 142  K 6.8* 6.7* 5.2* 4.1  CL 111 114* 114* 109  CO2 17* 19* 19* 27  GLUCOSE 132* 104* 122* 130*  BUN 105* 100* 97* 70*  CREATININE 3.35* 3.11* 2.75* 1.91*  CALCIUM 8.7* 8.4* 8.3* 7.7*  MG  --  2.9*  --   --   PHOS  --  3.9  --   --      CBC:  Recent Labs Lab 06/08/16 1435 06/09/16 0156  WBC 12.1* 11.8*  NEUTROABS 8.6*  --   HGB 8.9* 8.0*  HCT 28.8* 24.9*  MCV 101.9* 99.0  PLT 229 212      Microbiology:  Recent Results (from the past 720 hour(s))  Urine culture     Status: Abnormal (Preliminary result)   Collection Time: 06/08/16  2:36 PM  Result Value Ref Range Status   Specimen Description URINE, CLEAN CATCH  Final   Special Requests NONE  Final   Culture >=100,000 COLONIES/mL GRAM NEGATIVE RODS (A)  Final   Report Status PENDING  Incomplete     Coagulation Studies:  Recent Labs  06/08/16 2031  LABPROT 16.1*  INR 1.28    Urinalysis:  Recent Labs  06/08/16 1436  COLORURINE AMBER*  LABSPEC 1.014  PHURINE 6.0  GLUCOSEU NEGATIVE  HGBUR 1+*  BILIRUBINUR NEGATIVE  KETONESUR NEGATIVE  PROTEINUR 30*  NITRITE NEGATIVE  LEUKOCYTESUR 1+*      Imaging: US Renal  Result Date: 06/09/2016 CLINICAL DATA:  Acute renal failure EXAM: RENAL / URINARY TRACT ULTRASOUND COMPLETE COMPARISON:  None FINDINGS: Right Kidney: Length: 8.9 cm. Cortical thinning. Upper normal cortical echogenicity. Small lobulated cyst medially at mid RIGHT kidney 2.0 x 1.7 x 4.9 cm. Additional tiny cyst lateral RIGHT kidney 11 x 10 x 11 mm. No additional mass, hydronephrosis, or shadowing calcification Left Kidney: Length: 8.0 cm. Cortical thinning. Upper normal cortical echogenicity. No mass, hydronephrosis, or shadowing calcification. Bladder: Appears normal for degree of bladder distention. Mild to moderate lower abdominal ascites greater on RIGHT. Gallbladder filled  with multiple shadowing calculi. IMPRESSION: Atrophic appearing kidneys with small RIGHT renal cyst. Otherwise unremarkable renal ultrasound. Mild to moderate lower abdominal ascites. Cholelithiasis. Electronically Signed   By: Ulyses Southward M.D.   On: 06/09/2016 09:17   Dg Chest Portable 1 View  Result Date: 06/08/2016 CLINICAL DATA:  Weakness and failure to thrive. EXAM: PORTABLE CHEST 1 VIEW COMPARISON:  10/27/2015 FINDINGS: Cardiac silhouette remains upper limits of normal in size. No airspace consolidation, edema, pleural effusion, or pneumothorax is seen. No acute osseous abnormality is identified. IMPRESSION: No active disease. Electronically Signed   By: Sebastian Ache M.D.   On: 06/08/2016 16:06     Medications:   .  sodium bicarbonate  infusion 1000 mL 75 mL/hr at 06/10/16 0308   . aspirin  81 mg Oral Daily  . cefTRIAXone  1 g Intravenous Q24H  . docusate sodium  100 mg Oral BID  .  famotidine  20 mg Oral BID  . heparin subcutaneous  5,000 Units Subcutaneous Q8H  . latanoprost  1 drop Left Eye QHS  . levothyroxine  25 mcg Oral QAC breakfast  . timolol  2 drop Left Eye BID   acetaminophen **OR** acetaminophen, albuterol, albuterol, bisacodyl, ondansetron **OR** ondansetron (ZOFRAN) IV, senna-docusate  Assessment/ Plan:  82 y.o. African-American female with arthritis, chronic edema, chronic kidney disease, was admitted on 06/08/2016 with generalized weakness and is found to have acute renal failure and hyperkalemia.   1.  Acute renal failure Renal ultrasound shows atrophic appearing kidneys.  No evidence of obstruction Serum creatinine has started to improve with IV hydration - Today's level has improved down to 1.91  2.  Severe hyperkalemia Spironolactone has been discontinued Low potassium diet Now in normal range  3.  Chronic edema Echocardiogram completed Results pending  4. Chronic kidney disease st 3 Baseline creatinine 1.67/ GFR 30 from July 2017    LOS: 2 Neomia Herbel 11/2/201712:16 PM

## 2016-06-10 NOTE — Progress Notes (Signed)
Patient ID: Heather Zuniga, female   DOB: 1922-11-16, 82 y.o.   MRN: [id number]  [facility] PROGRESS NOTE  Heather Zuniga SLH:[id number] DOB: [birth year] DOA: 06/08/2016 PCP: No primary care provider on file.  HPI/Subjective: Patient offers no complaints and states that he she is well. As per the daughter, she's not been eating and she is very weak. The daughter states that she has also lost almost 50 pounds. I asked the patient if she wanted me to do a CAT scan to rule out any other process going on and she refused.  Objective: Vitals:   06/10/16 0504 06/10/16 1127  BP: 123/64 101/70  Pulse: 90 80  Resp: 18 17  Temp: 97.8 F (36.6 C) 97.8 F (36.6 C)    Filed Weights   06/08/16 1417  Weight: 80.7 kg (178 lb)    ROS: Review of Systems  Constitutional: Negative for chills and fever.  Eyes: Negative for blurred vision.  Respiratory: Negative for cough and shortness of breath.   Cardiovascular: Negative for chest pain.  Gastrointestinal: Negative for abdominal pain, constipation, diarrhea, nausea and vomiting.  Genitourinary: Negative for dysuria.  Musculoskeletal: Negative for joint pain.  Neurological: Negative for dizziness and headaches.   Exam: Physical Exam  HENT:  Nose: No mucosal edema.  Mouth/Throat: No oropharyngeal exudate or posterior oropharyngeal edema.  Eyes: Conjunctivae, EOM and lids are normal. Pupils are equal, round, and reactive to light.  Neck: No JVD present. Carotid bruit is not present. No edema present. No thyroid mass and no thyromegaly present.  Cardiovascular: S1 normal and S2 normal.  Exam reveals no gallop.   No murmur heard. Pulses:      Dorsalis pedis pulses are 2+ on the right side, and 2+ on the left side.  Respiratory: No respiratory distress. She has decreased breath sounds in the right lower field and the left lower field. She has no wheezes. She has no rhonchi. She has no rales.  GI: Soft. Bowel sounds are normal. There is no  tenderness.  Musculoskeletal:       Right ankle: She exhibits swelling.       Left ankle: She exhibits swelling.  Lymphadenopathy:    She has no cervical adenopathy.  Neurological: She is alert. No cranial nerve deficit.  Skin: Skin is warm. No rash noted. Nails show no clubbing.  Psychiatric: She has a normal mood and affect.      Data Reviewed: Basic Metabolic Panel:  Recent Labs Lab 06/08/16 1435 06/08/16 1806 06/09/16 0156 06/10/16 0809  NA 137 141 142 142  K 6.8* 6.7* 5.2* 4.1  CL 111 114* 114* 109  CO2 17* 19* 19* 27  GLUCOSE 132* 104* 122* 130*  BUN 105* 100* 97* 70*  CREATININE 3.35* 3.11* 2.75* 1.91*  CALCIUM 8.7* 8.4* 8.3* 7.7*  MG  --  2.9*  --   --   PHOS  --  3.9  --   --    Liver Function Tests:  Recent Labs Lab 06/08/16 1435  AST 47*  ALT 26  ALKPHOS 338*  BILITOT 0.6  PROT 8.4*  ALBUMIN 3.6   CBC:  Recent Labs Lab 06/08/16 1435 06/09/16 0156  WBC 12.1* 11.8*  NEUTROABS 8.6*  --   HGB 8.9* 8.0*  HCT 28.8* 24.9*  MCV 101.9* 99.0  PLT 229 212   Cardiac Enzymes:  Recent Labs Lab 06/08/16 1435 06/08/16 1806 06/09/16 0156 06/09/16 0759  TROPONINI 0.08* 0.07* 0.06* 0.06*     Studies: US Renal  Result Date: 06/09/2016 CLINICAL DATA:  Acute renal failure EXAM: RENAL / URINARY TRACT ULTRASOUND COMPLETE COMPARISON:  None FINDINGS: Right Kidney: Length: 8.9 cm. Cortical thinning. Upper normal cortical echogenicity. Small lobulated cyst medially at mid RIGHT kidney 2.0 x 1.7 x 4.9 cm. Additional tiny cyst lateral RIGHT kidney 11 x 10 x 11 mm. No additional mass, hydronephrosis, or shadowing calcification Left Kidney: Length: 8.0 cm. Cortical thinning. Upper normal cortical echogenicity. No mass, hydronephrosis, or shadowing calcification. Bladder: Appears normal for degree of bladder distention. Mild to moderate lower abdominal ascites greater on RIGHT. Gallbladder filled with multiple shadowing calculi. IMPRESSION: Atrophic appearing  kidneys with small RIGHT renal cyst. Otherwise unremarkable renal ultrasound. Mild to moderate lower abdominal ascites. Cholelithiasis. Electronically Signed   By: Ulyses Southward M.D.   On: 06/09/2016 09:17   Dg Chest Portable 1 View  Result Date: 06/08/2016 CLINICAL DATA:  Weakness and failure to thrive. EXAM: PORTABLE CHEST 1 VIEW COMPARISON:  10/27/2015 FINDINGS: Cardiac silhouette remains upper limits of normal in size. No airspace consolidation, edema, pleural effusion, or pneumothorax is seen. No acute osseous abnormality is identified. IMPRESSION: No active disease. Electronically Signed   By: Sebastian Ache M.D.   On: 06/08/2016 16:06    Scheduled Meds: . aspirin  81 mg Oral Daily  . cefTRIAXone  1 g Intravenous Q24H  . docusate sodium  100 mg Oral BID  . [START ON 06/11/2016] famotidine  20 mg Oral Daily  . heparin subcutaneous  5,000 Units Subcutaneous Q8H  . latanoprost  1 drop Left Eye QHS  . levothyroxine  25 mcg Oral QAC breakfast  . timolol  2 drop Left Eye BID   Continuous Infusions: .  sodium bicarbonate  infusion 1000 mL 75 mL/hr at 06/10/16 0308    Assessment/Plan:  1. Acute kidney injury on chronic kidney disease stage III. Hold spironolactone and torsemide. Patient placed on bicarbonate drip. Continue to monitor kidney function daily. Creatinine has improved to 1.91. 2. Hyperkalemia secondary to acute kidney injury. This has improved. 3. Borderline Elevated troponin. Echocardiogram showed normal EF with some diastolic dysfunction. Likely this is false positive with acute kidney injury. 4. Acute cystitis with hematuria. Started Rocephin. 5. Hypothyroidism unspecified on levothyroxine 6. Weakness. Physical therapy evaluation 7. Glaucoma on timolol 8. Failure to thrive and Poor appetite can be from uremia. Start Remeron at night for appetite and mood. Patient refused CT scan of the chest abdomen and pelvis stating that she is 82 years old I do not want any other  testing.  Code Status:     Code Status Orders        Start     Ordered   06/08/16 1730  Do not attempt resuscitation (DNR)  Continuous    Question Answer Comment  In the event of cardiac or respiratory ARREST Do not call a "code blue"   In the event of cardiac or respiratory ARREST Do not perform Intubation, CPR, defibrillation or ACLS   In the event of cardiac or respiratory ARREST Use medication by any route, position, wound care, and other measures to relive pain and suffering. May use oxygen, suction and manual treatment of airway obstruction as needed for comfort.      06/08/16 1729    Code Status History    Date Active Date Inactive Code Status Order ID Comments User Context   10/27/2015  7:51 PM 10/31/2015  6:52 PM Full Code [id number]  Milagros Loll, MD ED    Advance Directive Documentation  Flowsheet Row Most Recent Value  Type of Advance Directive  Healthcare Power of Attorney  Pre-existing out of facility DNR order (yellow form or pink MOST form)  No data  "MOST" Form in Place?  No data     Disposition Plan: Patient will likely need skilled nursing.  Consultants:  Cardiology  Nephrology  Antibiotics:  Rocephin  Time spent: 25 minutes  Alford Highland  [facility]

## 2016-06-10 NOTE — Progress Notes (Signed)
IV to Lt ac leaking, unable to use.

## 2016-06-10 NOTE — Care Management (Signed)
Recommendation present for skilled nursing placement.  Discussed with social work.  Will discuss palliative care consult due to failure to thrive with attending and the best plan of care at discharge.  If skilled nursing placement is pursued, would benefit from palliative to follow at the facility.  If it is decided that patient will return to [facility] assisted living, can resume home health through [facility] with the addition of palliative care

## 2016-06-10 NOTE — Progress Notes (Signed)
[facility] Encounter Note  Patient: Heather Zuniga / Admit Date: 06/08/2016 / Date of Encounter: 06/10/2016, 8:54 AM   Subjective: No particular change in condition. Patient is still mildly short of breath weak and fatigue multifactorial in nature. Troponin elevation appears to be better with the creatinine causing some of the elevation. Patient has slight improvements of chronic kidney disease  Review of Systems: Positive for: Weakness fatigue Negative for: Vision change, hearing change, syncope, dizziness, nausea, vomiting,diarrhea, bloody stool, stomach pain, cough, congestion, diaphoresis, urinary frequency, urinary pain,skin lesions, skin rashes Others previously listed  Objective:  Physical Exam: Blood pressure 123/64, pulse 90, temperature 97.8 F (36.6 C), resp. rate 18, height 5\' 1"  (1.549 m), weight 80.7 kg (178 lb), SpO2 98 %. Body mass index is 33.63 kg/m. General: Well developed, well nourished, in no acute distress. Head: Normocephalic, atraumatic, sclera non-icteric, no xanthomas, nares are without discharge. Neck: No apparent masses Lungs: Normal respirations with few wheezes, no rhonchi, no rales , some crackles   Heart: Regular rate and rhythm, normal S1 S2, no murmur, no rub, no gallop, PMI is normal size and placement, carotid upstroke normal without bruit, jugular venous pressure normal Abdomen: Soft, non-tender, non-distended with normoactive bowel sounds. No hepatosplenomegaly. Abdominal aorta is normal size without bruit Extremities: Trace to 1+ edema, no clubbing, no cyanosis, no ulcers,  Peripheral: 2+ radial, 2+ femoral, 2+ dorsal pedal pulses Neuro: Alert and oriented. Moves all extremities spontaneously. Psych:  Responds to questions appropriately with a normal affect.   Intake/Output Summary (Last 24 hours) at 06/10/16 0854 Last data filed at 06/10/16 0200  Gross per 24 hour  Intake             1500 ml  Output                0 ml   Net             1500 ml    Inpatient Medications:  . aspirin  81 mg Oral Daily  . cefTRIAXone  1 g Intravenous Q24H  . docusate sodium  100 mg Oral BID  . famotidine  20 mg Oral BID  . heparin subcutaneous  5,000 Units Subcutaneous Q8H  . latanoprost  1 drop Left Eye QHS  . levothyroxine  25 mcg Oral QAC breakfast  . timolol  2 drop Left Eye BID   Infusions:  .  sodium bicarbonate  infusion 1000 mL 75 mL/hr at 06/10/16 0308    Labs:  Recent Labs  06/08/16 1806 06/09/16 0156 06/10/16 0809  NA 141 142 142  K 6.7* 5.2* 4.1  CL 114* 114* 109  CO2 19* 19* 27  GLUCOSE 104* 122* 130*  BUN 100* 97* 70*  CREATININE 3.11* 2.75* 1.91*  CALCIUM 8.4* 8.3* 7.7*  MG 2.9*  --   --   PHOS 3.9  --   --     Recent Labs  06/08/16 1435  AST 47*  ALT 26  ALKPHOS 338*  BILITOT 0.6  PROT 8.4*  ALBUMIN 3.6    Recent Labs  06/08/16 1435 06/09/16 0156  WBC 12.1* 11.8*  NEUTROABS 8.6*  --   HGB 8.9* 8.0*  HCT 28.8* 24.9*  MCV 101.9* 99.0  PLT 229 212    Recent Labs  06/08/16 1435 06/08/16 1806 06/09/16 0156 06/09/16 0759  TROPONINI 0.08* 0.07* 0.06* 0.06*   Invalid input(s): POCBNP No results for input(s): HGBA1C in the last 72 hours.   Weights: 13/01/17  06/08/16 1417  Weight: 80.7 kg (178 lb)     Radiology/Studies:  US Renal  Result Date: Jun 29, 2016 CLINICAL DATA:  Acute renal failure EXAM: RENAL / URINARY TRACT ULTRASOUND COMPLETE COMPARISON:  None FINDINGS: Right Kidney: Length: 8.9 cm. Cortical thinning. Upper normal cortical echogenicity. Small lobulated cyst medially at mid RIGHT kidney 2.0 x 1.7 x 4.9 cm. Additional tiny cyst lateral RIGHT kidney 11 x 10 x 11 mm. No additional mass, hydronephrosis, or shadowing calcification Left Kidney: Length: 8.0 cm. Cortical thinning. Upper normal cortical echogenicity. No mass, hydronephrosis, or shadowing calcification. Bladder: Appears normal for degree of bladder distention. Mild to moderate lower abdominal  ascites greater on RIGHT. Gallbladder filled with multiple shadowing calculi. IMPRESSION: Atrophic appearing kidneys with small RIGHT renal cyst. Otherwise unremarkable renal ultrasound. Mild to moderate lower abdominal ascites. Cholelithiasis. Electronically Signed   By: Ulyses Southward M.D.   On: 2016-06-29 09:17   Dg Chest Portable 1 View  Result Date: 06/08/2016 CLINICAL DATA:  Weakness and failure to thrive. EXAM: PORTABLE CHEST 1 VIEW COMPARISON:  10/27/2015 FINDINGS: Cardiac silhouette remains upper limits of normal in size. No airspace consolidation, edema, pleural effusion, or pneumothorax is seen. No acute osseous abnormality is identified. IMPRESSION: No active disease. Electronically Signed   By: Sebastian Ache M.D.   On: 06/08/2016 16:06     Assessment and Recommendation  82 y.o. female with the significant new onset of the renal failure likely causing some pulmonary edema and abnormal troponin without current evidence of acute coronary syndrome 1. Continue following for kidney improvements with hydration as well as abstinence of Aldactone and torsemide 2. Continue rehabilitation without restriction 3. Discontinuation of heparin due to no further current evidence of myocardial infarction 4. No further cardiac diagnostics necessary at this time  Signed, Arnoldo Hooker M.D. FACC

## 2016-06-10 NOTE — Clinical Social Work Note (Signed)
Clinical Social Work Assessment  Patient Details  Name: Heather Zuniga MRN: [id number] Date of Birth: July 21, 1923  Date of referral:  06/10/16               Reason for consult:  Facility Placement                Permission sought to share information with:  Family Supports, Magazine features editor Permission granted to share information::  Yes, Verbal Permission Granted  Name::     White,Bernadette Daughter [phone]   Agency::  SNF admissions  Relationship::     Contact Information:     Housing/Transportation Living arrangements for the past 2 months:  Assisted Living Facility Source of Information:  Patient, Adult Children, Facility Patient Interpreter Needed:  None Criminal Activity/Legal Involvement Pertinent to Current Situation/Hospitalization:  No - Comment as needed Significant Relationships:  Adult Children Lives with:  Facility Resident Do you feel safe going back to the place where you live?  No Need for family participation in patient care:  Yes (Comment) (Patient has some dementia and she wants her daughter to help with decision making.)  Care giving concerns:  Patient and caregiver at ALF feel she needs some short term rehab before she can return back to ALF.   Social Worker assessment / plan:  Patient is a 82 year old female who is a resident at [facility].  Patient is alert and oriented x2 and able to carry on a conversation.  Patient states she has not been to rehab in the past, however due to dementia, MSW spoke to patients daughter and caregiver at ALF.  Patient's daughter said she has been in rehab in the past but it has been several years ago.  MSW explained to daughter how insurance will pay for her SNF stay and also told her that PT is recommending SNF.  Patient's daughter was informed by MSW that the ALF will have to determine if they can meet her needs, and if not she will need to go to SNF for short term rehab.  Patient and her daughter  gave MSW permission to fax her out to [facility] SNFs to begin bed search.  Patient's daughter did not express any other questions.  Employment status:  Retired Health and safety inspector:  Medicare PT Recommendations:  Skilled Nursing Facility Information / Referral to community resources:     Patient/Family's Response to care:  Patient is in agreement to going to SNF for short term rehab.  MSW spoke to ALF and they do not feel they can take her back until she gets some therapy first.  Patient/Family's Understanding of and Emotional Response to Diagnosis, Current Treatment, and Prognosis:  Patient expressed she is in agreement to going to SNF, patient's daughter would rather her go back to ALF and get therapy there, but understands patient will need SNF before she can return to ALF.  Emotional Assessment Appearance:  Appears stated age Attitude/Demeanor/Rapport:    Affect (typically observed):  Appropriate, Calm, Pleasant Orientation:  Oriented to Self Alcohol / Substance use:  Not Applicable Psych involvement (Current and /or in the community):  No (Comment)  Discharge Needs  Concerns to be addressed:  Lack of Support Readmission within the last 30 days:  No Current discharge risk:  Lack of support system Barriers to Discharge:  Continued Medical Work up   Heather Zuniga 06/10/2016, 4:31 PM

## 2016-06-10 NOTE — Consult Note (Signed)
Consultation Note Date: 06/15/2016   Patient Name: Heather Zuniga  DOB: 1923-02-16  MRN: [id number]  Age / Sex: 82 y.o., female  PCP: No primary care provider on file. Referring Physician: Demetrios Loll, MD  Reason for Consultation: Establishing goals of care  HPI/Patient Profile: 82 y.o. female  with past medical history of GERD, depression, arthritis, peripheral edema, anemia, and CKD stage III admitted on 06/08/2016 with generalized weakness, poor oral intake, and failure to thrive from an assisting living facility. In ED, she was found to have potassium of 6.8 and creatinine of 3.35. She was given bicarbonate, calcium gluconate, and kayexalate. Nephrology consulted-spironolactone discontinued. Renal functions improved with bicarb continuous infusion. Patient also started on IV ceftriaxone for UTI. Palliative medicine consultation for goals of care.   Clinical Assessment and Goals of Care: This NP met with patient at bedside. She was alert, oriented, following commands, and answering all questions. Introduced palliative care. [name] tells me she lives at [facility], an assisted living facility. She is able to perform all ADL's for herself. Ambulates independently with no falls. She has had a decreased appetite and tells me she has been trying to watch her sugar intake. She tells me she considers herself a 'loner' at the facility but does not feel depressed. 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, and doesn't want to have small talk with people like she once did. She is happy at the facility and does feel like she has a good quality of life.   She has a daughter, Heather Zuniga, who is her POA and understands her wishes. She also has a son and two grandchildren in [place]. She is hopeful for improvement and tells me she didn't realize she wasn't eating enough at the facility. She tells me  she is going to try and eat more and is interested in rehab. She is a spiritual individual. Taking it 'one day at a time.' Denies fears or concerns.   Updated daughter, Heather Zuniga, via telephone. She also tells me that her mother has a good quality of life at the assisted living facility but she has had a decreased appetite. Heather Zuniga tells me that her mother has intentionally been watching her weight and has lost a lot of weight in the past 10 years. She feels that her mother now obsesses over her weight and what she eats. She is hopeful for improvement at the rehab facility. Educated on palliative care and she agrees to have palliative services follow her mother outpatient to provide extra support if she would continue to decline. Answered questions and provided emotional support.    NEXT OF KIN-daughter Heather Zuniga)   SUMMARY OF RECOMMENDATIONS    DNR/DNI per patient and family.   Patient and family hopeful for improvement at rehab.   Recommend palliative services follow her outpatient.   Code Status/Advance Care Planning:  DNR   Symptom Management:   Per attending  Palliative Prophylaxis:   Aspiration and Delirium Protocol  Additional Recommendations (Limitations, Scope, Preferences):  Full Scope Treatment-except DNR/DNI  Psycho-social/Spiritual:   Desire for further Chaplaincy support:no  Additional Recommendations: Caregiving  Support/Resources  Prognosis:   Unable to determine  Discharge Planning: [facility] for rehab with Palliative care service follow-up      Primary Diagnoses: Present on Admission: . Renal failure (ARF), acute on chronic ([place])   I have reviewed the medical record, interviewed the patient and family, and examined the patient. The following aspects are pertinent.  Past Medical History:  Diagnosis Date  . Anemia   . Arthritis   . CKD (chronic kidney disease) stage 3, GFR 30-59 ml/min   . Depression   . Edema   . GERD  (gastroesophageal reflux disease)   . Pyuria    Social History   Social History  . Marital status: Divorced    Spouse name: N/A  . Number of children: N/A  . Years of education: N/A   Social History Main Topics  . Smoking status: Never Smoker  . Smokeless tobacco: Never Used  . Alcohol use No  . Drug use: No  . Sexual activity: Not Asked   Other Topics Concern  . None   Social History Narrative  . None   Family History  Problem Relation Age of Onset  . Rheum arthritis Other    Scheduled Meds:  Continuous Infusions:  PRN Meds:. Medications Prior to Admission:  Prior to Admission medications   Medication Sig Start Date End Date Taking? Authorizing Provider  acetaminophen (TYLENOL) 650 MG CR tablet Take 1,300 mg by mouth 2 (two) times daily.   Yes Historical Provider, MD  albuterol (PROVENTIL HFA;VENTOLIN HFA) 108 (90 Base) MCG/ACT inhaler Inhale 2 puffs into the lungs every 4 (four) hours as needed for wheezing or shortness of breath.   Yes Historical Provider, MD  latanoprost (XALATAN) 0.005 % ophthalmic solution Place 1 drop into the left eye at bedtime.    Yes Historical Provider, MD  levothyroxine (SYNTHROID, LEVOTHROID) 25 MCG tablet Take 25 mcg by mouth daily before breakfast.   Yes Historical Provider, MD  Multiple Vitamin (MULTIVITAMIN WITH MINERALS) TABS tablet Take 1 tablet by mouth daily.   Yes Historical Provider, MD  ranitidine (ZANTAC) 150 MG tablet Take 150 mg by mouth 2 (two) times daily.   Yes Historical Provider, MD  spironolactone (ALDACTONE) 25 MG tablet Take 25 mg by mouth daily.    Yes Historical Provider, MD  timolol (TIMOPTIC) 0.5 % ophthalmic solution Place 2 drops into the left eye 2 (two) times daily.    Yes Historical Provider, MD  torsemide (DEMADEX) 20 MG tablet Take 20 mg by mouth daily.    Yes Historical Provider, MD  white petrolatum (SM PETROLEUM JELLY) GEL Apply 1 application topically 2 (two) times daily. Apply to bilateral lower  extremities twice a day   Yes Historical Provider, MD   No Known Allergies Review of Systems  Constitutional: Positive for activity change and fatigue.  Neurological: Positive for weakness.    Physical Exam  Constitutional: She is oriented to person, place, and time. She is cooperative.  Cardiovascular: Regular rhythm and normal heart sounds.   Pulmonary/Chest: Effort normal and breath sounds normal.  Abdominal: Soft. Bowel sounds are normal.  Neurological: She is alert and oriented to person, place, and time.  Skin: Skin is warm and dry.  Psychiatric: She has a normal mood and affect. Her speech is normal and behavior is normal. Cognition and memory are normal.  Nursing note and vitals reviewed.  Vital Signs:  BP (!) 102/58 (BP Location: Right Arm)   Pulse 84   Temp 98.4 F (36.9 C)   Resp 14   Ht 5' 1"  (1.549 m)   Wt 80.7 kg (178 lb)   SpO2 95%   BMI 33.63 kg/m  Pain Assessment: No/denies pain   Pain Score: 0-No pain  SpO2: SpO2: 95 % O2 Device:SpO2: 95 % O2 Flow Rate: .   IO: Intake/output summary: No intake or output data in the 24 hours ending 06/15/16 1107  LBM: Last BM Date: 06/11/16 Baseline Weight: Weight: 80.7 kg (178 lb) Most recent weight: Weight: 80.7 kg (178 lb)     Palliative Assessment/Data: PPS 50%   Flowsheet Rows   Flowsheet Row Most Recent Value  Intake Tab  Referral Department  Hospitalist  Unit at Time of Referral  Cardiac/Telemetry Unit  Palliative Care Primary Diagnosis  Neurology  Date Notified  06/10/16  Palliative Care Type  New Palliative care  Reason for referral  Clarify Goals of Care  Date of Admission  06/08/16  Date first seen by Palliative Care  06/10/16  # of days Palliative referral response time  0 Day(s)  # of days IP prior to Palliative referral  2  Clinical Assessment  Palliative Performance Scale Score  50%  Psychosocial & Spiritual Assessment  Palliative Care Outcomes  Patient/Family meeting held?  Yes  Who was at  the meeting?  patient and daughter via telephone  Palliative Care Outcomes  Clarified goals of care, Provided psychosocial or spiritual support  Actual Discharge Date  06/11/16 [SNF with palliative care]     Time In: 1445 Time Out: 1555 Time Total: 65mn Greater than 50%  of this time was spent counseling and coordinating care related to the above assessment and plan.  Signed by:  MIhor Dow FNP-C Palliative Medicine Team  Phone: [phone]: [phone]  Please contact Palliative Medicine Team phone at [phone] questions and concerns.  For individual provider: See AShea Evans

## 2016-06-11 LAB — URINE CULTURE

## 2016-06-11 LAB — BASIC METABOLIC PANEL
Anion gap: 7 (ref 5–15)
BUN: 56 mg/dL — AB (ref 6–20)
CHLORIDE: 106 mmol/L (ref 101–111)
CO2: 29 mmol/L (ref 22–32)
Calcium: 7.8 mg/dL — ABNORMAL LOW (ref 8.9–10.3)
Creatinine, Ser: 1.65 mg/dL — ABNORMAL HIGH (ref 0.44–1.00)
GFR calc Af Amer: 30 mL/min — ABNORMAL LOW (ref >60)
GFR calc non Af Amer: 26 mL/min — ABNORMAL LOW (ref >60)
Glucose, Bld: 143 mg/dL — ABNORMAL HIGH (ref 65–99)
POTASSIUM: 4 mmol/L (ref 3.5–5.1)
SODIUM: 142 mmol/L (ref 135–145)

## 2016-06-11 LAB — CBC
HEMATOCRIT: 27.6 % — AB (ref 35.0–47.0)
HEMOGLOBIN: 9 g/dL — AB (ref 12.0–16.0)
MCH: 32.2 pg (ref 26.0–34.0)
MCHC: 32.7 g/dL (ref 32.0–36.0)
MCV: 98.3 fL (ref 80.0–100.0)
Platelets: 255 10*3/uL (ref 150–440)
RBC: 2.81 MIL/uL — AB (ref 3.80–5.20)
RDW: 16.2 % — ABNORMAL HIGH (ref 11.5–14.5)
WBC: 12.6 10*3/uL — ABNORMAL HIGH (ref 3.6–11.0)

## 2016-06-11 LAB — MRSA PCR SCREENING: MRSA BY PCR: NEGATIVE

## 2016-06-11 MED ORDER — ENSURE ENLIVE PO LIQD
237.0000 mL | Freq: Two times a day (BID) | ORAL | Status: DC
  Administered 2016-06-11: 237 mL via ORAL

## 2016-06-11 MED ORDER — FOSFOMYCIN TROMETHAMINE 3 G PO PACK: PACK | ORAL | 0 refills | Status: DC

## 2016-06-11 MED ORDER — FOSFOMYCIN TROMETHAMINE 3 G PO PACK
3.0000 g | PACK | Freq: Once | ORAL | Status: AC
  Administered 2016-06-11: 3 g via ORAL
  Filled 2016-06-11: qty 3

## 2016-06-11 MED ORDER — MIRTAZAPINE 7.5 MG PO TABS: 7.5000 mg | ORAL_TABLET | Freq: Every day | ORAL | 0 refills | Status: AC

## 2016-06-11 MED ORDER — BACID PO TABS
2.0000 | ORAL_TABLET | Freq: Three times a day (TID) | ORAL | Status: DC
  Filled 2016-06-11: qty 2

## 2016-06-11 MED ORDER — BACID PO TABS: 2.0000 | ORAL_TABLET | Freq: Three times a day (TID) | ORAL | 0 refills | Status: DC

## 2016-06-11 MED ORDER — DOCUSATE SODIUM 100 MG PO CAPS: 100.0000 mg | ORAL_CAPSULE | Freq: Two times a day (BID) | ORAL | 0 refills | Status: DC | PRN

## 2016-06-11 MED ORDER — SODIUM CHLORIDE 0.9 % IV SOLN
500.0000 mg | INTRAVENOUS | Status: DC
  Administered 2016-06-11: 0.5 g via INTRAVENOUS
  Filled 2016-06-11 (×2): qty 0.5

## 2016-06-11 MED ORDER — ENSURE ENLIVE PO LIQD: 237.0000 mL | Freq: Two times a day (BID) | ORAL | 0 refills | Status: AC

## 2016-06-11 NOTE — Care Management (Signed)
Heads up referral to [facility] with palliative care to follow patient as an outpatient

## 2016-06-11 NOTE — Progress Notes (Signed)
New referral for facility Palliative services at [facility] following discharge received from [facility]. Plan is for discharge to [facility] today. [facility] referral made aware. Thank you. Dayna Barker RN, BSN, [facility] [place], hospital Liaison [phone] c

## 2016-06-11 NOTE — Progress Notes (Signed)
Patient discharged via stretcher and EMS. IV removed and catheter intact. All discharge instructions given and patient verbalizes understanding. Tele removed and returned. No prescriptions given to patient. No distress noted at time of discharge

## 2016-06-11 NOTE — Progress Notes (Signed)
Physical Therapy Treatment Patient Details Name: Trystin Hargrove MRN: [id number] DOB: 07/18/1923 Today's Date: 06/11/2016    History of Present Illness Pt admitted for ARF. Pt with complaints of weakness and has possible NSTEMI. Pt with history of peripheral edema, anemia, CKD, Depression, and GERD. Pt with trending down troponin.    PT Comments    Pt is making good progress towards goals with improved balance sitting at EOB post balance activities. Pt still requires +2 assist for all mobility. Unable to perform SLR independently, not safe to attempt standing trials at this time secondary to weakness. Improved cognition this date. Pt willing to work with therapy.  Follow Up Recommendations  SNF     Equipment Recommendations       Recommendations for Other Services       Precautions / Restrictions Precautions Precautions: Fall Restrictions Weight Bearing Restrictions: No    Mobility  Bed Mobility Overal bed mobility: Needs Assistance;+2 for physical assistance Bed Mobility: Supine to Sit     Supine to sit: Max assist;+2 for physical assistance     General bed mobility comments: assist for trunk mobility and moving B LE off bed. Once seated, pt unable to sit upright with heavy posterior leaning noted. Heavy assist to maintain upright posture. Sitting tolerance improved with balance activities. +2 max assist required for transitioning back to supine.  Transfers                 General transfer comment: unable to perform at this time  Ambulation/Gait                 Stairs            Wheelchair Mobility    Modified Rankin (Stroke Patients Only)       Balance                                    Cognition Arousal/Alertness: Awake/alert Behavior During Therapy: WFL for tasks assessed/performed Overall Cognitive Status: Within Functional Limits for tasks assessed                      Exercises Other Exercises Other  Exercises: supine ther-ex performed including B ankle pumps, quad sets, SAQ, SLRs, and hip abd/add. Pt able to perform 12 reps with min/mod assist. R LE weaker compared to L LE. Seated balance ther-ex performed including reaching outside of BOS in all directions with L hand while R hand holding bed railing. Pt able to improve sitting balance to only requiring cga. Pt fatigues quickly with endurance and is returned back to bed. Other Exercises: B UE ther-ex performed including elbow flexion/extension    General Comments        Pertinent Vitals/Pain Pain Assessment: No/denies pain    Home Living                      Prior Function            PT Goals (current goals can now be found in the care plan section) Acute Rehab PT Goals Patient Stated Goal: to go back to ALF PT Goal Formulation: With patient Time For Goal Achievement: 06/23/16 Potential to Achieve Goals: Good Progress towards PT goals: Progressing toward goals    Frequency    Min 2X/week      PT Plan Current plan remains appropriate    Co-evaluation  End of Session   Activity Tolerance: Patient limited by fatigue Patient left: in bed;with bed alarm set     Time: 1011-1034 PT Time Calculation (min) (ACUTE ONLY): 23 min  Charges:  $Therapeutic Exercise: 8-22 mins $Therapeutic Activity: 8-22 mins                    G Codes:      Kiosha Buchan 07-08-16, 12:56 PM  [name], PT, DPT [phone]

## 2016-06-11 NOTE — Care Management Important Message (Signed)
Important Message  Patient Details  Name: Heather Zuniga MRN: [id number] Date of Birth: Nov 17, 1922   Medicare Important Message Given:  Yes    Eber Hong, RN 06/11/2016, 9:43 AM

## 2016-06-11 NOTE — Discharge Instructions (Signed)
Dehydration  Dehydration means your body does not have as much fluid as it needs. Your kidneys, brain, and heart will not work properly without the right amount of fluids and salt. Older adults are more likely to become dehydrated than younger adults. This is because:    Their bodies do not hold water as well.   Their bodies do not respond to temperature changes as well.   They do not get thirsty as easily or as quickly.  HOME CARE   Ask your doctor how to replace body fluid losses (rehydrate).   Drink enough fluids to keep your pee (urine) clear or pale yellow.   Drink small amounts of fluids often if you feel sick to your stomach (nauseous) or throw up (vomit).   Eat like you normally do.   Avoid:    Foods or drinks high in sugar.    Bubbly (carbonated) drinks.    Juice.    Very hot or cold fluids.    Drinks with caffeine.    Fatty, greasy foods.    Alcohol.    Tobacco.    Eating too much.    Gelatin desserts.   Wash your hands to avoid spreading germs (bacteria, viruses).   Only take medicine as told by your doctor.   Keep all doctor visits as told.  GET HELP IF:   You have belly (abdominal) pain that gets worse or stays in one spot (localizes).   You have a rash, stiff neck, or bad headache.   You get easily annoyed, sleepy, or are hard to wake up.   You feel weak, dizzy, or very thirsty.   You have a fever.  GET HELP RIGHT AWAY IF:    You cannot drink fluid without throwing up.   You get worse even with treatment.   You throw up often.   You have watery poop (diarrhea) often.   Your vomit has blood in it or looks greenish.   Your poop (stool) has blood in it or looks black and tarry.   You have not peed in 6 to 8 hours or have only peed a small amount of very dark pee.   You pass out (faint).  MAKE SURE YOU:    Understand these instructions.   Will watch your condition.   Will get help right away if you are not doing well or get worse.     This information is not intended to replace  advice given to you by your health care provider. Make sure you discuss any questions you have with your health care provider.     Document Released: 07/15/2011 Document Revised: 07/31/2013 Document Reviewed: 04/02/2013  Elsevier Interactive Patient Education 2016 [employer]

## 2016-06-11 NOTE — Clinical Social Work Note (Addendum)
MSW presented bed offers to patient's daughter who is going to review the offers and call MSW back for final decision.  MSW to continue to follow patient's progress throughout discharge planning.  11:30am MSW received phone call from patient's daughter who would like her to go to [facility] [place] for short term rehab.  MSW contacted [facility] who said they can accept her today, MSW notified physician and bedside nurse.  2:56 pm Patient to be d/c'ed today to [facility] [place].  Patient and family agreeable to plans will transport via ems RN to call report to [facility] nurse [phone].  Ervin Knack. Maryah Marinaro, MSW [phone]  Mon-Fri 8a-4:30p 06/11/2016 10:00 AM

## 2016-06-11 NOTE — Discharge Summary (Signed)
[employer] - [place] at [facility]   PATIENT NAME: Heather Zuniga    MR#:  [id number]  DATE OF BIRTH:  1923/07/01  DATE OF ADMISSION:  06/08/2016 ADMITTING PHYSICIAN: Shaune Pollack, MD  DATE OF DISCHARGE: 06/11/2016  PRIMARY CARE PHYSICIAN: Dr. Einar Crow   ADMISSION DIAGNOSIS:  Dehydration [E86.0] Weakness [R53.1] Elevated troponin [R74.8] AKI (acute kidney injury) (HCC) [N17.9]  DISCHARGE DIAGNOSIS:  Active Problems:   Renal failure (ARF), acute on chronic (HCC)   Pressure injury of skin   SECONDARY DIAGNOSIS:   Past Medical History:  Diagnosis Date  . Anemia   . Arthritis   . CKD (chronic kidney disease) stage 3, GFR 30-59 ml/min   . Depression   . Edema   . GERD (gastroesophageal reflux disease)   . Pyuria     HOSPITAL COURSE:   1.  Acute kidney injury on chronic kidney disease stage III. Hold spironolactone and torsemide. Patient was given IV fluids initially and then switched to bicarbonate drip. Creatinine has improved back to her baseline at 1.65. Since the patient is not eating and drinking very well we need to avoid medications that dehydrate her. 2. Hyperkalemia secondary to acute kidney injury. This has improved. 3. Borderline elevated troponin. Echocardiogram showed normal EF with some diastolic dysfunction. No further cardiac workup. 4. Acute cystitis with hematuria. ESBL Escherichia coli growing in the urine culture. Patient was on Rocephin while here but it is resistant to Rocephin. I did give 1 dose of Invanz IV here in the hospital. Patient refused a PICC line. Case discussed with pharmacist will try fosfomycin 3 g every 3 days for 3 doses first dose to be given in the hospital. Second dose Monday and last dose on Thursday. I will prescribe probiotic. If the fosfomycin does not work can consider IM Invanz but this is not ideal because this is a painful injection. 5. Hypothyroidism unspecified on levothyroxine 6. Weakness. Physical  therapy recommended rehabilitation 7. Glaucoma on usual eyedrops 8. Failure to thrive and poor appetite. This can be from uremia or infection. I did start Remeron to elevate appetite and mood. Patient refused CT scan of the abdomen and pelvis for further workup stating that she is 82 years old and she did not want any other testing. 9. Palate of care can follow over at the facility. Patient is a DO NOT RESUSCITATE.  DISCHARGE CONDITIONS:   Satisfactory  CONSULTS OBTAINED:  Treatment Team:  Mosetta Pigeon, MD Lamar Blinks, MD  DRUG ALLERGIES:  No Known Allergies  DISCHARGE MEDICATIONS:   Current Discharge Medication List    START taking these medications   Details  docusate sodium (COLACE) 100 MG capsule Take 1 capsule (100 mg total) by mouth 2 (two) times daily as needed for mild constipation. Qty: 10 capsule, Refills: 0    feeding supplement, ENSURE ENLIVE, (ENSURE ENLIVE) LIQD Take 237 mLs by mouth 2 (two) times daily between meals. Qty: 60 Bottle, Refills: 0    fosfomycin (MONUROL) 3 g PACK One dose on MOnday 06/14/2016 and one dose on Thursday 06/17/2016 Qty: 2 packet, Refills: 0    lactobacillus acidophilus (BACID) TABS tablet Take 2 tablets by mouth 3 (three) times daily. Qty: 180 tablet, Refills: 0    mirtazapine (REMERON) 7.5 MG tablet Take 1 tablet (7.5 mg total) by mouth at bedtime. Qty: 30 tablet, Refills: 0      CONTINUE these medications which have NOT CHANGED   Details  acetaminophen (TYLENOL) 650 MG CR tablet Take  1,300 mg by mouth 2 (two) times daily.    albuterol (PROVENTIL HFA;VENTOLIN HFA) 108 (90 Base) MCG/ACT inhaler Inhale 2 puffs into the lungs every 4 (four) hours as needed for wheezing or shortness of breath.    latanoprost (XALATAN) 0.005 % ophthalmic solution Place 1 drop into the left eye at bedtime.     levothyroxine (SYNTHROID, LEVOTHROID) 25 MCG tablet Take 25 mcg by mouth daily before breakfast.    Multiple Vitamin (MULTIVITAMIN WITH  MINERALS) TABS tablet Take 1 tablet by mouth daily.    ranitidine (ZANTAC) 150 MG tablet Take 150 mg by mouth 2 (two) times daily.    timolol (TIMOPTIC) 0.5 % ophthalmic solution Place 2 drops into the left eye 2 (two) times daily.     white petrolatum (SM PETROLEUM JELLY) GEL Apply 1 application topically 2 (two) times daily. Apply to bilateral lower extremities twice a day      STOP taking these medications     spironolactone (ALDACTONE) 25 MG tablet      torsemide (DEMADEX) 20 MG tablet          DISCHARGE INSTRUCTIONS:    Follow-up with Dr. rehabilitation 1 day  If you experience worsening of your admission symptoms, develop shortness of breath, life threatening emergency, suicidal or homicidal thoughts you must seek medical attention immediately by calling 911 or calling your MD immediately  if symptoms less severe.  You Must read complete instructions/literature along with all the possible adverse reactions/side effects for all the Medicines you take and that have been prescribed to you. Take any new Medicines after you have completely understood and accept all the possible adverse reactions/side effects.   Please note  You were cared for by a hospitalist during your hospital stay. If you have any questions about your discharge medications or the care you received while you were in the hospital after you are discharged, you can call the unit and asked to speak with the hospitalist on call if the hospitalist that took care of you is not available. Once you are discharged, your primary care physician will handle any further medical issues. Please note that NO REFILLS for any discharge medications will be authorized once you are discharged, as it is imperative that you return to your primary care physician (or establish a relationship with a primary care physician if you do not have one) for your aftercare needs so that they can reassess your need for medications and monitor your lab  values.    Today   CHIEF COMPLAINT:   Chief Complaint  Patient presents with  . Weakness    HISTORY OF PRESENT ILLNESS:  Heather Zuniga  is a 82 y.o. female sense of the hospital for weakness.   VITAL SIGNS:  Blood pressure (!) 102/58, pulse 84, temperature 98.7 F (37.1 C), temperature source Oral, resp. rate 14, height 5\' 1"  (1.549 m), weight 80.7 kg (178 lb), SpO2 95 %.     PHYSICAL EXAMINATION:  GENERAL:  82 y.o.-year-old patient lying in the bed with no acute distress.  EYES: Pupils equal, round, reactive to light and accommodation. No scleral icterus. Extraocular muscles intact.  HEENT: Head atraumatic, normocephalic. Oropharynx and nasopharynx clear.  NECK:  Supple, no jugular venous distention. No thyroid enlargement, no tenderness.  LUNGS: Normal breath sounds bilaterally, no wheezing, rales,rhonchi or crepitation. No use of accessory muscles of respiration.  CARDIOVASCULAR: S1, S2 normal. No murmurs, rubs, or gallops.  ABDOMEN: Soft, non-tender, non-distended. Bowel sounds present. No organomegaly or mass.  EXTREMITIES: 3+ edema. No cyanosis, or clubbing.  NEUROLOGIC: Cranial nerves II through XII are intact. Muscle strength 5/5 in all extremities. Sensation intact. Gait not checked.  PSYCHIATRIC: The patient is alert.  SKIN: Chronic lower extremity discoloration   DATA REVIEW:   CBC  Recent Labs Lab 06/11/16 0437  WBC 12.6*  HGB 9.0*  HCT 27.6*  PLT 255    Chemistries   Recent Labs Lab 06/08/16 1435 06/08/16 1806  06/11/16 0437  NA 137 141  < > 142  K 6.8* 6.7*  < > 4.0  CL 111 114*  < > 106  CO2 17* 19*  < > 29  GLUCOSE 132* 104*  < > 143*  BUN 105* 100*  < > 56*  CREATININE 3.35* 3.11*  < > 1.65*  CALCIUM 8.7* 8.4*  < > 7.8*  MG  --  2.9*  --   --   AST 47*  --   --   --   ALT 26  --   --   --   ALKPHOS 338*  --   --   --   BILITOT 0.6  --   --   --   < > = values in this interval not displayed.  Cardiac Enzymes  Recent Labs Lab  06/09/16 0759  TROPONINI 0.06*    Microbiology Results  Results for orders placed or performed during the hospital encounter of 06/08/16  Urine culture     Status: Abnormal   Collection Time: 06/08/16  2:36 PM  Result Value Ref Range Status   Specimen Description URINE, CLEAN CATCH  Final   Special Requests NONE  Final   Culture (A)  Final    >=100,000 COLONIES/mL ESCHERICHIA COLI Confirmed Extended Spectrum Beta-Lactamase Producer (ESBL) Performed at [facility]    Report Status 06/11/2016 FINAL  Final   Organism ID, Bacteria ESCHERICHIA COLI (A)  Final      Susceptibility   Escherichia coli - MIC*    AMPICILLIN >=32 RESISTANT Resistant     CEFAZOLIN >=64 RESISTANT Resistant     CEFTRIAXONE >=64 RESISTANT Resistant     CIPROFLOXACIN >=4 RESISTANT Resistant     GENTAMICIN <=1 SENSITIVE Sensitive     IMIPENEM <=0.25 SENSITIVE Sensitive     NITROFURANTOIN <=16 SENSITIVE Sensitive     TRIMETH/SULFA <=20 SENSITIVE Sensitive     AMPICILLIN/SULBACTAM 16 INTERMEDIATE Intermediate     PIP/TAZO <=4 SENSITIVE Sensitive     Extended ESBL POSITIVE Resistant     * >=100,000 COLONIES/mL ESCHERICHIA COLI  MRSA PCR Screening     Status: None   Collection Time: 06/11/16  4:58 AM  Result Value Ref Range Status   MRSA by PCR NEGATIVE NEGATIVE Final    Comment:        The GeneXpert MRSA Assay (FDA approved for NASAL specimens only), is one component of a comprehensive MRSA colonization surveillance program. It is not intended to diagnose MRSA infection nor to guide or monitor treatment for MRSA infections.      Management plans discussed with the patient, family and they are in agreement.  CODE STATUS:     Code Status Orders        Start     Ordered   06/08/16 1730  Do not attempt resuscitation (DNR)  Continuous    Question Answer Comment  In the event of cardiac or respiratory ARREST Do not call a "code blue"   In the event of cardiac or respiratory ARREST Do not  perform Intubation, CPR, defibrillation or ACLS   In the event of cardiac or respiratory ARREST Use medication by any route, position, wound care, and other measures to relive pain and suffering. May use oxygen, suction and manual treatment of airway obstruction as needed for comfort.      06/08/16 1729    Code Status History    Date Active Date Inactive Code Status Order ID Comments User Context   10/27/2015  7:51 PM 10/31/2015  6:52 PM Full Code [id number]  Milagros Loll, MD ED    Advance Directive Documentation   Flowsheet Row Most Recent Value  Type of Advance Directive  Healthcare Power of Attorney  Pre-existing out of facility DNR order (yellow form or pink MOST form)  No data  "MOST" Form in Place?  No data      TOTAL TIME TAKING CARE OF THIS PATIENT: 35  minutes.    Alford Highland M.D on 06/11/2016 at 12:17 PM  Between 7am to 6pm - Pager - [phone]  After 6pm go to [email or web address] - password [email or web address]  [employer] Office  [phone]  CC: Primary care physician; Dr. Einar Crow

## 2016-06-11 NOTE — Progress Notes (Signed)
Subjective:   Laying in bed. Has no complaints. Wants to go home.  Creatinine 1.65 (1.91)   Objective:  Vital signs in last 24 hours:  Temp:  [97.8 F (36.6 C)-99.1 F (37.3 C)] 98.7 F (37.1 C) (11/03 0454) Pulse Rate:  [80-84] 83 (11/03 0454) Resp:  [17-18] 18 (11/03 0454) BP: (101-119)/(52-70) 101/52 (11/03 0454) SpO2:  [92 %-99 %] 92 % (11/03 0454)  Weight change:  Filed Weights   06/08/16 1417  Weight: 80.7 kg (178 lb)    Intake/Output:    Intake/Output Summary (Last 24 hours) at 06/11/16 1115 Last data filed at 06/11/16 1019  Gross per 24 hour  Intake             2030 ml  Output                0 ml  Net             2030 ml     Physical Exam: General: No acute distress, laying in the bed   HEENT moist oral mucous membranes   Neck Supple   Pulm/lungs Normal effort, scattered rhonchi   CVS/Heart Irregular   Abdomen:  Soft, mildly distended   Extremities: trace dependent edema   Neurologic: Alert, follows commands   Skin: Normal turgor           Basic Metabolic Panel:   Recent Labs Lab 06/08/16 1435 06/08/16 1806 06/09/16 0156 06/10/16 0809 06/11/16 0437  NA 137 141 142 142 142  K 6.8* 6.7* 5.2* 4.1 4.0  CL 111 114* 114* 109 106  CO2 17* 19* 19* 27 29  GLUCOSE 132* 104* 122* 130* 143*  BUN 105* 100* 97* 70* 56*  CREATININE 3.35* 3.11* 2.75* 1.91* 1.65*  CALCIUM 8.7* 8.4* 8.3* 7.7* 7.8*  MG  --  2.9*  --   --   --   PHOS  --  3.9  --   --   --      CBC:  Recent Labs Lab 06/08/16 1435 06/09/16 0156 06/11/16 0437  WBC 12.1* 11.8* 12.6*  NEUTROABS 8.6*  --   --   HGB 8.9* 8.0* 9.0*  HCT 28.8* 24.9* 27.6*  MCV 101.9* 99.0 98.3  PLT 229 212 255      Microbiology:  Recent Results (from the past 720 hour(s))  Urine culture     Status: Abnormal   Collection Time: 06/08/16  2:36 PM  Result Value Ref Range Status   Specimen Description URINE, CLEAN CATCH  Final   Special Requests NONE  Final   Culture (A)  Final    >=100,000  COLONIES/mL ESCHERICHIA COLI Confirmed Extended Spectrum Beta-Lactamase Producer (ESBL) Performed at [facility]    Report Status 06/11/2016 FINAL  Final   Organism ID, Bacteria ESCHERICHIA COLI (A)  Final      Susceptibility   Escherichia coli - MIC*    AMPICILLIN >=32 RESISTANT Resistant     CEFAZOLIN >=64 RESISTANT Resistant     CEFTRIAXONE >=64 RESISTANT Resistant     CIPROFLOXACIN >=4 RESISTANT Resistant     GENTAMICIN <=1 SENSITIVE Sensitive     IMIPENEM <=0.25 SENSITIVE Sensitive     NITROFURANTOIN <=16 SENSITIVE Sensitive     TRIMETH/SULFA <=20 SENSITIVE Sensitive     AMPICILLIN/SULBACTAM 16 INTERMEDIATE Intermediate     PIP/TAZO <=4 SENSITIVE Sensitive     Extended ESBL POSITIVE Resistant     * >=100,000 COLONIES/mL ESCHERICHIA COLI  MRSA PCR Screening     Status:  None   Collection Time: 06/11/16  4:58 AM  Result Value Ref Range Status   MRSA by PCR NEGATIVE NEGATIVE Final    Comment:        The GeneXpert MRSA Assay (FDA approved for NASAL specimens only), is one component of a comprehensive MRSA colonization surveillance program. It is not intended to diagnose MRSA infection nor to guide or monitor treatment for MRSA infections.     Coagulation Studies:  Recent Labs  06/08/16 2031  LABPROT 16.1*  INR 1.28    Urinalysis:  Recent Labs  06/08/16 1436  COLORURINE AMBER*  LABSPEC 1.014  PHURINE 6.0  GLUCOSEU NEGATIVE  HGBUR 1+*  BILIRUBINUR NEGATIVE  KETONESUR NEGATIVE  PROTEINUR 30*  NITRITE NEGATIVE  LEUKOCYTESUR 1+*      Imaging: No results found.   Medications:   .  sodium bicarbonate  infusion 1000 mL 75 mL/hr at 06/10/16 2112   . aspirin  81 mg Oral Daily  . docusate sodium  100 mg Oral BID  . ertapenem  500 mg Intravenous Q24H  . famotidine  20 mg Oral Daily  . heparin subcutaneous  5,000 Units Subcutaneous Q8H  . latanoprost  1 drop Left Eye QHS  . levothyroxine  25 mcg Oral QAC breakfast  . mirtazapine  7.5 mg Oral  QHS  . timolol  2 drop Left Eye BID   acetaminophen **OR** acetaminophen, albuterol, albuterol, bisacodyl, ondansetron **OR** ondansetron (ZOFRAN) IV, senna-docusate  Assessment/ Plan:  82 y.o. black female with arthritis, chronic edema, chronic kidney disease, was admitted on 06/08/2016 with generalized weakness and is found to have acute renal failure and hyperkalemia.   1.  Acute renal failure with hyperkalemia: on chronic kidney disease stage III. Baseline GFR of 32 from 10/27/15 - Improved with IV fluids: bicarb at 53mL/hr - encourage PO intake - discontinue spironolactone.   2. Urinary tract infection: E. Coli - IV ceftriaxone.    LOS: 3 Kierra Jezewski 11/3/201711:15 AM

## 2016-06-15 DIAGNOSIS — Z7189 Other specified counseling: Secondary | ICD-10-CM

## 2016-06-15 DIAGNOSIS — N179 Acute kidney failure, unspecified: Secondary | ICD-10-CM

## 2016-06-15 DIAGNOSIS — Z515 Encounter for palliative care: Secondary | ICD-10-CM

## 2016-06-15 DIAGNOSIS — R531 Weakness: Secondary | ICD-10-CM

## 2016-06-23 ENCOUNTER — Encounter: Payer: Self-pay | Admitting: Emergency Medicine

## 2016-06-23 ENCOUNTER — Inpatient Hospital Stay
Admission: EM | Admit: 2016-06-23 | Discharge: 2016-06-25 | DRG: 683 | Disposition: A | Discharge status: Hospice | Payer: Medicare Other | Attending: Internal Medicine | Admitting: Internal Medicine

## 2016-06-23 DIAGNOSIS — Z7189 Other specified counseling: Secondary | ICD-10-CM | POA: Diagnosis not present

## 2016-06-23 DIAGNOSIS — K8020 Calculus of gallbladder without cholecystitis without obstruction: Secondary | ICD-10-CM | POA: Diagnosis present

## 2016-06-23 DIAGNOSIS — E46 Unspecified protein-calorie malnutrition: Secondary | ICD-10-CM | POA: Diagnosis present

## 2016-06-23 DIAGNOSIS — Z6839 Body mass index (BMI) 39.0-39.9, adult: Secondary | ICD-10-CM | POA: Diagnosis not present

## 2016-06-23 DIAGNOSIS — N183 Chronic kidney disease, stage 3 (moderate): Secondary | ICD-10-CM | POA: Diagnosis present

## 2016-06-23 DIAGNOSIS — R1084 Generalized abdominal pain: Secondary | ICD-10-CM

## 2016-06-23 DIAGNOSIS — D649 Anemia, unspecified: Secondary | ICD-10-CM

## 2016-06-23 DIAGNOSIS — R627 Adult failure to thrive: Secondary | ICD-10-CM

## 2016-06-23 DIAGNOSIS — R188 Other ascites: Secondary | ICD-10-CM | POA: Diagnosis present

## 2016-06-23 DIAGNOSIS — Z66 Do not resuscitate: Secondary | ICD-10-CM | POA: Diagnosis present

## 2016-06-23 DIAGNOSIS — R17 Unspecified jaundice: Secondary | ICD-10-CM

## 2016-06-23 DIAGNOSIS — E872 Acidosis: Secondary | ICD-10-CM | POA: Diagnosis present

## 2016-06-23 DIAGNOSIS — I2510 Atherosclerotic heart disease of native coronary artery without angina pectoris: Secondary | ICD-10-CM | POA: Diagnosis present

## 2016-06-23 DIAGNOSIS — E039 Hypothyroidism, unspecified: Secondary | ICD-10-CM | POA: Diagnosis present

## 2016-06-23 DIAGNOSIS — E8809 Other disorders of plasma-protein metabolism, not elsewhere classified: Secondary | ICD-10-CM | POA: Diagnosis present

## 2016-06-23 DIAGNOSIS — K219 Gastro-esophageal reflux disease without esophagitis: Secondary | ICD-10-CM | POA: Diagnosis present

## 2016-06-23 DIAGNOSIS — E860 Dehydration: Secondary | ICD-10-CM | POA: Diagnosis present

## 2016-06-23 DIAGNOSIS — R7401 Elevation of levels of liver transaminase levels: Secondary | ICD-10-CM | POA: Diagnosis present

## 2016-06-23 DIAGNOSIS — F0390 Unspecified dementia without behavioral disturbance: Secondary | ICD-10-CM | POA: Diagnosis present

## 2016-06-23 DIAGNOSIS — Z515 Encounter for palliative care: Secondary | ICD-10-CM | POA: Diagnosis present

## 2016-06-23 DIAGNOSIS — E871 Hypo-osmolality and hyponatremia: Secondary | ICD-10-CM

## 2016-06-23 DIAGNOSIS — R740 Nonspecific elevation of levels of transaminase and lactic acid dehydrogenase [LDH]: Secondary | ICD-10-CM

## 2016-06-23 DIAGNOSIS — E875 Hyperkalemia: Secondary | ICD-10-CM

## 2016-06-23 DIAGNOSIS — N189 Chronic kidney disease, unspecified: Secondary | ICD-10-CM | POA: Diagnosis not present

## 2016-06-23 DIAGNOSIS — R8281 Pyuria: Secondary | ICD-10-CM

## 2016-06-23 DIAGNOSIS — N179 Acute kidney failure, unspecified: Secondary | ICD-10-CM | POA: Diagnosis not present

## 2016-06-23 DIAGNOSIS — D72829 Elevated white blood cell count, unspecified: Secondary | ICD-10-CM

## 2016-06-23 DIAGNOSIS — M1990 Unspecified osteoarthritis, unspecified site: Secondary | ICD-10-CM | POA: Diagnosis present

## 2016-06-23 DIAGNOSIS — M069 Rheumatoid arthritis, unspecified: Secondary | ICD-10-CM | POA: Diagnosis present

## 2016-06-23 DIAGNOSIS — H409 Unspecified glaucoma: Secondary | ICD-10-CM | POA: Diagnosis present

## 2016-06-23 LAB — COMPREHENSIVE METABOLIC PANEL
ALBUMIN: 2.5 g/dL — AB (ref 3.5–5.0)
ALT: 115 U/L — ABNORMAL HIGH (ref 14–54)
ANION GAP: 16 — AB (ref 5–15)
AST: 242 U/L — AB (ref 15–41)
Alkaline Phosphatase: 1187 U/L — ABNORMAL HIGH (ref 38–126)
BILIRUBIN TOTAL: 11.7 mg/dL — AB (ref 0.3–1.2)
BUN: 103 mg/dL — AB (ref 6–20)
CHLORIDE: 96 mmol/L — AB (ref 101–111)
CO2: 18 mmol/L — ABNORMAL LOW (ref 22–32)
Calcium: 8.2 mg/dL — ABNORMAL LOW (ref 8.9–10.3)
Creatinine, Ser: 8.12 mg/dL — ABNORMAL HIGH (ref 0.44–1.00)
GFR calc Af Amer: 4 mL/min — ABNORMAL LOW (ref >60)
GFR, EST NON AFRICAN AMERICAN: 4 mL/min — AB (ref >60)
GLUCOSE: 75 mg/dL (ref 65–99)
POTASSIUM: 5.1 mmol/L (ref 3.5–5.1)
Sodium: 130 mmol/L — ABNORMAL LOW (ref 135–145)
TOTAL PROTEIN: 7.1 g/dL (ref 6.5–8.1)

## 2016-06-23 LAB — CBC WITH DIFFERENTIAL/PLATELET
BLASTS: 0 %
Band Neutrophils: 0 %
Basophils Absolute: 0 10*3/uL (ref 0–0.1)
Basophils Relative: 0 %
EOS PCT: 1 %
Eosinophils Absolute: 0.1 10*3/uL (ref 0–0.7)
HEMATOCRIT: 29.4 % — AB (ref 35.0–47.0)
HEMOGLOBIN: 9.1 g/dL — AB (ref 12.0–16.0)
LYMPHS ABS: 1.4 10*3/uL (ref 1.0–3.6)
LYMPHS PCT: 10 %
MCH: 33.3 pg (ref 26.0–34.0)
MCHC: 31.1 g/dL — AB (ref 32.0–36.0)
MCV: 107.2 fL — AB (ref 80.0–100.0)
METAMYELOCYTES PCT: 0 %
MONOS PCT: 8 %
Monocytes Absolute: 1.1 10*3/uL — ABNORMAL HIGH (ref 0.2–0.9)
Myelocytes: 0 %
NEUTROS ABS: 11.2 10*3/uL — AB (ref 1.4–6.5)
Neutrophils Relative %: 81 %
OTHER: 0 %
Platelets: 283 10*3/uL (ref 150–440)
Promyelocytes Absolute: 0 %
RBC: 2.75 MIL/uL — AB (ref 3.80–5.20)
RDW: 18 % — AB (ref 11.5–14.5)
WBC: 13.8 10*3/uL — AB (ref 3.6–11.0)
nRBC: 32 /100 WBC — ABNORMAL HIGH

## 2016-06-23 LAB — URINALYSIS COMPLETE WITH MICROSCOPIC (ARMC ONLY)
Glucose, UA: NEGATIVE mg/dL
Ketones, ur: NEGATIVE mg/dL
Nitrite: NEGATIVE
PH: 5 (ref 5.0–8.0)
PROTEIN: 30 mg/dL — AB
Specific Gravity, Urine: 1.024 (ref 1.005–1.030)

## 2016-06-23 LAB — LIPASE, BLOOD: LIPASE: 38 U/L (ref 11–51)

## 2016-06-23 LAB — GLUCOSE, CAPILLARY: GLUCOSE-CAPILLARY: 68 mg/dL (ref 65–99)

## 2016-06-23 LAB — AMMONIA: Ammonia: 43 umol/L — ABNORMAL HIGH (ref 9–35)

## 2016-06-23 MED ORDER — DOCUSATE SODIUM 100 MG PO CAPS: 100.0000 mg | ORAL_CAPSULE | Freq: Two times a day (BID) | ORAL | Status: DC | PRN

## 2016-06-23 MED ORDER — ENSURE ENLIVE PO LIQD
237.0000 mL | Freq: Two times a day (BID) | ORAL | Status: DC
  Administered 2016-06-24 (×2): 237 mL via ORAL

## 2016-06-23 MED ORDER — ACETAMINOPHEN ER 650 MG PO TBCR: 1300.0000 mg | EXTENDED_RELEASE_TABLET | Freq: Two times a day (BID) | ORAL | Status: DC

## 2016-06-23 MED ORDER — ONDANSETRON HCL 4 MG PO TABS: 4.0000 mg | ORAL_TABLET | Freq: Four times a day (QID) | ORAL | Status: DC | PRN

## 2016-06-23 MED ORDER — TIMOLOL MALEATE 0.5 % OP SOLN
2.0000 [drp] | Freq: Two times a day (BID) | OPHTHALMIC | Status: DC
Start: 2016-06-23 — End: 2016-06-25
  Administered 2016-06-24 – 2016-06-25 (×3): 2 [drp] via OPHTHALMIC
  Filled 2016-06-23: qty 5

## 2016-06-23 MED ORDER — HEPARIN SODIUM (PORCINE) 5000 UNIT/ML IJ SOLN
5000.0000 [IU] | Freq: Three times a day (TID) | INTRAMUSCULAR | Status: DC
  Administered 2016-06-24 – 2016-06-25 (×4): 5000 [IU] via SUBCUTANEOUS
  Filled 2016-06-23 (×4): qty 1

## 2016-06-23 MED ORDER — LATANOPROST 0.005 % OP SOLN
1.0000 [drp] | Freq: Every day | OPHTHALMIC | Status: DC
  Administered 2016-06-24: 1 [drp] via OPHTHALMIC
  Filled 2016-06-23: qty 2.5

## 2016-06-23 MED ORDER — SODIUM CHLORIDE 0.9 % IV BOLUS (SEPSIS)
1000.0000 mL | Freq: Once | INTRAVENOUS | Status: AC
  Administered 2016-06-23: 1000 mL via INTRAVENOUS

## 2016-06-23 MED ORDER — ACETAMINOPHEN 650 MG RE SUPP: 650.0000 mg | Freq: Four times a day (QID) | RECTAL | Status: DC | PRN

## 2016-06-23 MED ORDER — ACETAMINOPHEN 325 MG PO TABS: 650.0000 mg | ORAL_TABLET | Freq: Four times a day (QID) | ORAL | Status: DC | PRN

## 2016-06-23 MED ORDER — ONDANSETRON HCL 4 MG/2ML IJ SOLN: 4.0000 mg | Freq: Four times a day (QID) | INTRAMUSCULAR | Status: DC | PRN

## 2016-06-23 MED ORDER — ALBUTEROL SULFATE (2.5 MG/3ML) 0.083% IN NEBU: 2.5000 mg | INHALATION_SOLUTION | RESPIRATORY_TRACT | Status: DC | PRN

## 2016-06-23 MED ORDER — LEVOTHYROXINE SODIUM 50 MCG PO TABS
25.0000 ug | ORAL_TABLET | Freq: Every day | ORAL | Status: DC
  Administered 2016-06-24 – 2016-06-25 (×2): 25 ug via ORAL
  Filled 2016-06-23 (×3): qty 1

## 2016-06-23 MED ORDER — SODIUM CHLORIDE 0.9 % IV SOLN
INTRAVENOUS | Status: DC
  Administered 2016-06-23: 23:00:00 via INTRAVENOUS

## 2016-06-23 MED ORDER — SODIUM CHLORIDE 0.9% FLUSH
3.0000 mL | Freq: Two times a day (BID) | INTRAVENOUS | Status: DC
  Administered 2016-06-23 – 2016-06-25 (×4): 3 mL via INTRAVENOUS

## 2016-06-23 MED ORDER — FAMOTIDINE IN NACL 20-0.9 MG/50ML-% IV SOLN
20.0000 mg | Freq: Two times a day (BID) | INTRAVENOUS | Status: DC
  Administered 2016-06-24: 20 mg via INTRAVENOUS
  Filled 2016-06-23 (×3): qty 50

## 2016-06-23 NOTE — ED Notes (Signed)
Lab at bedside collecting blood draw

## 2016-06-23 NOTE — ED Provider Notes (Signed)
Time Seen: Approximately1825  I have reviewed the triage notes  Chief Complaint: Abnormal Lab   History of Present Illness: Heather Zuniga is a 82 y.o. female who presents from local nursing facility for evaluation of some abnormal laboratory work. Review of her laboratory work shows an increasing BUN and creatinine. Patient has a history of chronic renal insufficiency. No new history is been noted. The patient denies any significant pain. Patient denies any chest pain, abdominal pain, headache. She is able to follow commands. She is oriented 1.   According to EMS and the facility this is her baseline mental status  Past Medical History:  Diagnosis Date  . Anemia   . Arthritis   . CKD (chronic kidney disease) stage 3, GFR 30-59 ml/min   . Depression   . Edema   . GERD (gastroesophageal reflux disease)   . Pyuria     Patient Active Problem List   Diagnosis Date Noted  . Palliative care encounter   . Goals of care, counseling/discussion   . AKI (acute kidney injury) (HCC)   . Weakness   . Pressure injury of skin 06/10/2016  . Renal failure (ARF), acute on chronic (HCC) 06/08/2016  . UTI (lower urinary tract infection) 10/27/2015  . GERD (gastroesophageal reflux disease) 06/09/2015  . Obesity 06/09/2015  . Arthritis 06/09/2015  . Edema 06/09/2015  . Stasis leg ulcer (HCC) 06/09/2015  . Absolute anemia 03/23/2014  . Chronic kidney disease (CKD), stage III (moderate) 03/23/2014  . Arthritis, degenerative 03/23/2014    History reviewed. No pertinent surgical history.  History reviewed. No pertinent surgical history.  Current Outpatient Rx  . Order #: [id number] Class: Historical Med  . Order #: [id number] Class: Historical Med  . Order #: [id number] Class: Normal  . Order #: [id number] Class: Print  . Order #: [id number] Class: Print  . Order #: [id number] Class: Print  . Order #: [id number] Class: Historical Med  . Order #: [id number] Class: Historical Med  . Order #:  [id number] Class: Print  . Order #: [id number] Class: Historical Med  . Order #: [id number] Class: Historical Med  . Order #: [id number] Class: Historical Med  . Order #: [id number] Class: Historical Med    Allergies:  Patient has no known allergies.  Family History: Family History  Problem Relation Age of Onset  . Rheum arthritis Other     Social History: Social History  Substance Use Topics  . Smoking status: Never Smoker  . Smokeless tobacco: Never Used  . Alcohol use No     Review of Systems:   10 point review of systems was performed and was otherwise negative: Review of systems was acquired from the patient and also the medical record and per EMS transfer of information Constitutional: No fever Eyes: No visual disturbances ENT: No sore throat, ear pain Cardiac: No chest pain Respiratory: No shortness of breath, wheezing, or stridor Abdomen: No abdominal pain, no vomiting, No diarrhea Endocrine: No weight loss, No night sweats Extremities: No peripheral edema, cyanosis Skin: No rashes, easy bruising Neurologic: No focal weakness, trouble with speech or swollowing Urologic: No dysuria, Hematuria, or urinary frequency *  Physical Exam:  ED Triage Vitals  Enc Vitals Group     BP 06/23/16 1824 90/66     Pulse Rate 06/23/16 1824 61     Resp 06/23/16 1824 16     Temp 06/23/16 1824 97.7 F (36.5 C)     Temp Source 06/23/16 1824 Oral     SpO2 06/23/16 1824 99 %     Weight --  Height --      Head Circumference --      Peak Flow --      Pain Score 06/23/16 1827 0     Pain Loc --      Pain Edu? --      Excl. in GC? --     General: Awake , Alert , and Oriented times 1. Glasgow Coma Scale 15 Head: Normal cephalic , atraumatic Eyes: Pupils equal , round, reactive to light. Extraocular eye movements are intact Nose/Throat: Dry mucous membranes No nasal drainage, patent upper airway without erythema or exudate.  Neck: Supple, Full range of motion, No anterior  adenopathy or palpable thyroid masses Lungs: Clear to ascultation without wheezes , rhonchi, or rales Heart: Regular rate, regular rhythm without murmurs , gallops , or rubs Abdomen: Soft, non tender without rebound, guarding , or rigidity; bowel sounds positive and symmetric in all 4 quadrants. No organomegaly .        Extremities: 2 plus symmetric pulses. No edema, clubbing or cyanosis Neurologic: , Motor symmetric without deficits, sensory intact Skin: warm, dry, no rashes   Labs:   All laboratory work was reviewed including any pertinent negatives or positives listed below:  Labs Reviewed  GLUCOSE, CAPILLARY  COMPREHENSIVE METABOLIC PANEL  CBC WITH DIFFERENTIAL/PLATELET  AMMONIA  LIPASE, BLOOD  Patient has significant renal failure. Potassium is stable at 5.1. Concern for alkaline phosphatase and some other laboratory work to could show metastatic disease.   ED Course: Patient was started on IV fluid resuscitation fluid appeared to be prerenal failure. Patient's BUN up over 100 creatinine 8.12. Patient does not appear to have an obstructive source for her urine. Her urine did come back positive for red blood cells and cloudy urine but otherwise no signs of a significant infection. Clinical Course      Assessment: Acute on chronic renal failure   F   Plan:  Patient's stay here was uneventful and she was initiated on IV fluid resuscitation required further inpatient management            Jennye Moccasin, MD 06/23/16 2349

## 2016-06-23 NOTE — H&P (Signed)
[facility] - [place] at [facility]   PATIENT NAME: Heather Zuniga    MR#:  [id number]  DATE OF BIRTH:  14-Jul-1923  DATE OF ADMISSION:  06/23/2016  PRIMARY CARE PHYSICIAN: No PCP Per Patient   REQUESTING/REFERRING PHYSICIAN: Tora Duck MD  CHIEF COMPLAINT:   Chief Complaint  Patient presents with  . Abnormal Lab    HISTORY OF PRESENT ILLNESS: Heather Zuniga  is a 82 y.o. female with a known history of  CKD stage 3, anemia, depression and gerdWho was recently hospitalized AKI and hyperkalemia patient was kept in the hospital her diuretics were held. She was subsequently discharged to rehabilitation facility. Patient at the facility has not been eating or drinking and has been receiving IV fluids for the past 5 days according to her daughter. Who had blood work checked and was noted to have worsening renal function and was sent to the emergency room. Patient's noted to have a creatinine of 8 and a GFR of 4. She is also noticed to have alkaline phosphatase the significantly elevated in the thousand range. Patient has not had any falls. To note her previous ultrasound of the abdomen did show ascites.   PAST MEDICAL HISTORY:   Past Medical History:  Diagnosis Date  . Anemia   . Arthritis   . CKD (chronic kidney disease) stage 3, GFR 30-59 ml/min   . Depression   . Edema   . GERD (gastroesophageal reflux disease)   . Pyuria     PAST SURGICAL HISTORY: Past Surgical History:  Procedure Laterality Date  . NO PAST SURGERIES      SOCIAL HISTORY:  Social History  Substance Use Topics  . Smoking status: Never Smoker  . Smokeless tobacco: Never Used  . Alcohol use No    FAMILY HISTORY:  Family History  Problem Relation Age of Onset  . Rheum arthritis Other     DRUG ALLERGIES: No Known Allergies  REVIEW OF SYSTEMS:   CONSTITUTIONAL:  Limited due to patient unable to give much history   MEDICATIONS AT HOME:  Prior to Admission medications   Medication  Sig Start Date End Date Taking? Authorizing Provider  acetaminophen (TYLENOL) 650 MG CR tablet Take 1,300 mg by mouth 2 (two) times daily.   Yes Historical Provider, MD  albuterol (PROVENTIL HFA;VENTOLIN HFA) 108 (90 Base) MCG/ACT inhaler Inhale 2 puffs into the lungs every 4 (four) hours as needed for wheezing or shortness of breath.   Yes Historical Provider, MD  docusate sodium (COLACE) 100 MG capsule Take 1 capsule (100 mg total) by mouth 2 (two) times daily as needed for mild constipation. 06/11/16  Yes Alford Highland, MD  feeding supplement, ENSURE ENLIVE, (ENSURE ENLIVE) LIQD Take 237 mLs by mouth 2 (two) times daily between meals. 06/11/16  Yes Alford Highland, MD  lactobacillus acidophilus (BACID) TABS tablet Take 2 tablets by mouth 3 (three) times daily. 06/11/16  Yes Richard Renae Gloss, MD  latanoprost (XALATAN) 0.005 % ophthalmic solution Place 1 drop into the left eye at bedtime.    Yes Historical Provider, MD  levothyroxine (SYNTHROID, LEVOTHROID) 25 MCG tablet Take 25 mcg by mouth daily before breakfast.   Yes Historical Provider, MD  mirtazapine (REMERON) 7.5 MG tablet Take 1 tablet (7.5 mg total) by mouth at bedtime. 06/11/16  Yes Alford Highland, MD  Multiple Vitamin (MULTIVITAMIN WITH MINERALS) TABS tablet Take 1 tablet by mouth daily.   Yes Historical Provider, MD  ranitidine (ZANTAC) 150 MG tablet Take 150 mg by mouth  2 (two) times daily.   Yes Historical Provider, MD  sulfamethoxazole-trimethoprim (BACTRIM DS,SEPTRA DS) 800-160 MG tablet Take 1 tablet by mouth 2 (two) times daily. 3419,3790   Yes Historical Provider, MD  timolol (TIMOPTIC) 0.5 % ophthalmic solution Place 2 drops into the left eye 2 (two) times daily.    Yes Historical Provider, MD  white petrolatum (SM PETROLEUM JELLY) GEL Apply 1 application topically 2 (two) times daily. Apply to bilateral lower extremities twice a day   Yes Historical Provider, MD  fosfomycin (MONUROL) 3 g PACK One dose on MOnday 06/14/2016 and one  dose on Thursday 06/17/2016 Patient not taking: Reported on 06/23/2016 06/11/16   Alford Highland, MD      PHYSICAL EXAMINATION:   VITAL SIGNS: Blood pressure (!) 103/55, pulse 71, temperature 97.7 F (36.5 C), temperature source Oral, resp. rate 19, SpO2 95 %.  GENERAL:  82 y.o.-year-old patient lying in the bed with no acute distress.  EYES: Pupils equal, round, reactive to light and accommodation. No scleral icterus. Extraocular muscles intact.  HEENT: Head atraumatic, normocephalic. Oropharynx and nasopharynx clear.  NECK:  Supple, no jugular venous distention. No thyroid enlargement, no tenderness.  LUNGS: Normal breath sounds bilaterally, no wheezing, rales,rhonchi or crepitation. No use of accessory muscles of respiration.  CARDIOVASCULAR: S1, S2 normal. No murmurs, rubs, or gallops.  ABDOMEN: Soft, nontender, distended. Bowel sounds present. No organomegaly or mass.  EXTREMITIES: 3+ pedal edema, cyanosis, or clubbing.  NEUROLOGIC: Cranial nerves II through XII are intact. Muscle strength 5/5 in all extremities. Sensation intact. Gait not checked.  PSYCHIATRIC: The patient is alert and oriented x 3.  SKIN: No obvious rash, lesion, or ulcer.   LABORATORY PANEL:   CBC  Recent Labs Lab 06/23/16 1858  WBC 13.8*  HGB 9.1*  HCT 29.4*  PLT 283  MCV 107.2*  MCH 33.3  MCHC 31.1*  RDW 18.0*  LYMPHSABS 1.4  MONOABS 1.1*  EOSABS 0.1  BASOSABS 0.0   ------------------------------------------------------------------------------------------------------------------  Chemistries   Recent Labs Lab 06/23/16 1858  NA 130*  K 5.1  CL 96*  CO2 18*  GLUCOSE 75  BUN 103*  CREATININE 8.12*  CALCIUM 8.2*  AST 242*  ALT 115*  ALKPHOS 1,187*  BILITOT 11.7*   ------------------------------------------------------------------------------------------------------------------ CrCl cannot be calculated (Unknown ideal  weight.). ------------------------------------------------------------------------------------------------------------------ No results for input(s): TSH, T4TOTAL, T3FREE, THYROIDAB in the last 72 hours.  Invalid input(s): FREET3   Coagulation profile No results for input(s): INR, PROTIME in the last 168 hours. ------------------------------------------------------------------------------------------------------------------- No results for input(s): DDIMER in the last 72 hours. -------------------------------------------------------------------------------------------------------------------  Cardiac Enzymes No results for input(s): CKMB, TROPONINI, MYOGLOBIN in the last 168 hours.  Invalid input(s): CK ------------------------------------------------------------------------------------------------------------------ Invalid input(s): POCBNP  ---------------------------------------------------------------------------------------------------------------  Urinalysis    Component Value Date/Time   COLORURINE AMBER (A) 06/23/2016 2046   APPEARANCEUR CLOUDY (A) 06/23/2016 2046   APPEARANCEUR Turbid 04/17/2012 1647   LABSPEC 1.024 06/23/2016 2046   LABSPEC 1.005 04/17/2012 1647   PHURINE 5.0 06/23/2016 2046   GLUCOSEU NEGATIVE 06/23/2016 2046   GLUCOSEU Negative 04/17/2012 1647   HGBUR 1+ (A) 06/23/2016 2046   BILIRUBINUR 1+ (A) 06/23/2016 2046   BILIRUBINUR Negative 04/17/2012 1647   KETONESUR NEGATIVE 06/23/2016 2046   PROTEINUR 30 (A) 06/23/2016 2046   NITRITE NEGATIVE 06/23/2016 2046   LEUKOCYTESUR TRACE (A) 06/23/2016 2046   LEUKOCYTESUR 3+ 04/17/2012 1647     RADIOLOGY: No results found.  EKG: Orders placed or performed during the hospital encounter of 10/27/15  . ED EKG  .  ED EKG    IMPRESSION AND PLAN: Patient is a 82 year old being admitted with acute on chronic renal failure  1. Acute renal failure on chronic kidney disease I suspect that she likely has  ATN, at this point I will give her aggressive IV fluids We will repeat ultrasound of the abdomen I will ask nephrology to see  2. Elevated alkaline phosphatase with noted ascites during previous admission Suspicion for underlying malignancy is high, I offered patient CT of the abdomen she states that she does not wish to have this With her advanced age and her prognosis is very poor I'll last palliative care team to see  3. Hypothyroidism continue Synthroid  4. Glaucoma continue eyedrops  5. Generalized anasarca likely due to combination of hypoalbuminemia  6. CODE STATUS confirmed with the daughter she is a DO NOT RESUSCITATE   All the records are reviewed and case discussed with ED provider. Management plans discussed with the patient, family and they are in agreement.  CODE STATUS: Code Status History    Date Active Date Inactive Code Status Order ID Comments User Context   06/08/2016  5:29 PM 06/11/2016  8:29 PM DNR [id number]  Shaune Pollack, MD Inpatient   10/27/2015  7:51 PM 10/31/2015  6:52 PM Full Code [id number]  Milagros Loll, MD ED    Questions for Most Recent Historical Code Status (Order [id number])    Question Answer Comment   In the event of cardiac or respiratory ARREST Do not call a "code blue"    In the event of cardiac or respiratory ARREST Do not perform Intubation, CPR, defibrillation or ACLS    In the event of cardiac or respiratory ARREST Use medication by any route, position, wound care, and other measures to relive pain and suffering. May use oxygen, suction and manual treatment of airway obstruction as needed for comfort.        TOTAL TIME TAKING CARE OF THIS PATIENT:42minutes.    Auburn Bilberry M.D on 06/23/2016 at 9:06 PM  Between 7am to 6pm - Pager - [phone]  After 6pm go to [email or web address] - password [email or web address] [facility]  [place] [facility]  Office  [phone]  CC: Primary care physician; No PCP Per Patient

## 2016-06-23 NOTE — ED Notes (Signed)
Lab called for blood draw. Pt hard stick by mulitple RNs

## 2016-06-23 NOTE — ED Triage Notes (Signed)
Pt to ED via EMS , sent from [facility] for abnorm labs,  Decreased renal fnx. Pt A&O to self. Per EMS facility states this is pt baseline. Pt denies any pain at this time.

## 2016-06-24 ENCOUNTER — Inpatient Hospital Stay: Payer: Medicare Other

## 2016-06-24 DIAGNOSIS — Z515 Encounter for palliative care: Secondary | ICD-10-CM

## 2016-06-24 DIAGNOSIS — N179 Acute kidney failure, unspecified: Principal | ICD-10-CM

## 2016-06-24 DIAGNOSIS — R740 Nonspecific elevation of levels of transaminase and lactic acid dehydrogenase [LDH]: Secondary | ICD-10-CM

## 2016-06-24 DIAGNOSIS — N189 Chronic kidney disease, unspecified: Secondary | ICD-10-CM

## 2016-06-24 DIAGNOSIS — Z7189 Other specified counseling: Secondary | ICD-10-CM

## 2016-06-24 LAB — RENAL FUNCTION PANEL
Albumin: 2.3 g/dL — ABNORMAL LOW (ref 3.5–5.0)
Anion gap: 14 (ref 5–15)
BUN: 98 mg/dL — AB (ref 6–20)
CHLORIDE: 100 mmol/L — AB (ref 101–111)
CO2: 18 mmol/L — ABNORMAL LOW (ref 22–32)
CREATININE: 7.72 mg/dL — AB (ref 0.44–1.00)
Calcium: 7.9 mg/dL — ABNORMAL LOW (ref 8.9–10.3)
GFR calc Af Amer: 5 mL/min — ABNORMAL LOW (ref >60)
GFR, EST NON AFRICAN AMERICAN: 4 mL/min — AB (ref >60)
GLUCOSE: 88 mg/dL (ref 65–99)
Phosphorus: 6.5 mg/dL — ABNORMAL HIGH (ref 2.5–4.6)
Potassium: 5.1 mmol/L (ref 3.5–5.1)
Sodium: 132 mmol/L — ABNORMAL LOW (ref 135–145)

## 2016-06-24 LAB — COMPREHENSIVE METABOLIC PANEL
ALK PHOS: 1069 U/L — AB (ref 38–126)
ALT: 106 U/L — AB (ref 14–54)
ANION GAP: 15 (ref 5–15)
AST: 225 U/L — ABNORMAL HIGH (ref 15–41)
Albumin: 2.2 g/dL — ABNORMAL LOW (ref 3.5–5.0)
BILIRUBIN TOTAL: 10.9 mg/dL — AB (ref 0.3–1.2)
BUN: 99 mg/dL — ABNORMAL HIGH (ref 6–20)
CALCIUM: 7.8 mg/dL — AB (ref 8.9–10.3)
CO2: 17 mmol/L — ABNORMAL LOW (ref 22–32)
CREATININE: 7.9 mg/dL — AB (ref 0.44–1.00)
Chloride: 100 mmol/L — ABNORMAL LOW (ref 101–111)
GFR, EST AFRICAN AMERICAN: 5 mL/min — AB (ref >60)
GFR, EST NON AFRICAN AMERICAN: 4 mL/min — AB (ref >60)
Glucose, Bld: 82 mg/dL (ref 65–99)
Potassium: 5.2 mmol/L — ABNORMAL HIGH (ref 3.5–5.1)
SODIUM: 132 mmol/L — AB (ref 135–145)
TOTAL PROTEIN: 6.3 g/dL — AB (ref 6.5–8.1)

## 2016-06-24 LAB — BASIC METABOLIC PANEL
ANION GAP: 15 (ref 5–15)
Anion gap: 14 (ref 5–15)
BUN: 100 mg/dL — AB (ref 6–20)
BUN: 102 mg/dL — AB (ref 6–20)
CALCIUM: 7.8 mg/dL — AB (ref 8.9–10.3)
CHLORIDE: 101 mmol/L (ref 101–111)
CO2: 16 mmol/L — ABNORMAL LOW (ref 22–32)
CO2: 17 mmol/L — AB (ref 22–32)
CREATININE: 7.99 mg/dL — AB (ref 0.44–1.00)
Calcium: 7.8 mg/dL — ABNORMAL LOW (ref 8.9–10.3)
Chloride: 101 mmol/L (ref 101–111)
Creatinine, Ser: 8.01 mg/dL — ABNORMAL HIGH (ref 0.44–1.00)
GFR calc Af Amer: 4 mL/min — ABNORMAL LOW (ref >60)
GFR calc Af Amer: 4 mL/min — ABNORMAL LOW (ref >60)
GFR calc non Af Amer: 4 mL/min — ABNORMAL LOW (ref >60)
GFR, EST NON AFRICAN AMERICAN: 4 mL/min — AB (ref >60)
GLUCOSE: 86 mg/dL (ref 65–99)
Glucose, Bld: 68 mg/dL (ref 65–99)
POTASSIUM: 5.3 mmol/L — AB (ref 3.5–5.1)
POTASSIUM: 5.1 mmol/L (ref 3.5–5.1)
SODIUM: 132 mmol/L — AB (ref 135–145)
SODIUM: 132 mmol/L — AB (ref 135–145)

## 2016-06-24 LAB — CBC
HCT: 29.3 % — ABNORMAL LOW (ref 35.0–47.0)
Hemoglobin: 9.1 g/dL — ABNORMAL LOW (ref 12.0–16.0)
MCH: 33.1 pg (ref 26.0–34.0)
MCHC: 31 g/dL — ABNORMAL LOW (ref 32.0–36.0)
MCV: 107 fL — ABNORMAL HIGH (ref 80.0–100.0)
PLATELETS: 267 10*3/uL (ref 150–440)
RBC: 2.74 MIL/uL — AB (ref 3.80–5.20)
RDW: 19 % — AB (ref 11.5–14.5)
WBC: 14 10*3/uL — AB (ref 3.6–11.0)

## 2016-06-24 LAB — CK: CK TOTAL: 65 U/L (ref 38–234)

## 2016-06-24 MED ORDER — SODIUM POLYSTYRENE SULFONATE 15 GM/60ML PO SUSP
30.0000 g | Freq: Once | ORAL | Status: AC
  Administered 2016-06-24: 30 g via ORAL
  Filled 2016-06-24: qty 120

## 2016-06-24 MED ORDER — BOOST / RESOURCE BREEZE PO LIQD
1.0000 | Freq: Three times a day (TID) | ORAL | Status: DC
  Administered 2016-06-24 – 2016-06-25 (×2): 1 via ORAL

## 2016-06-24 MED ORDER — SODIUM CHLORIDE 0.9 % IV SOLN: 500.0000 mg | Freq: Three times a day (TID) | INTRAVENOUS | Status: DC

## 2016-06-24 MED ORDER — MEROPENEM-SODIUM CHLORIDE 500 MG/50ML IV SOLR
500.0000 mg | Freq: Three times a day (TID) | INTRAVENOUS | Status: DC
  Administered 2016-06-24 – 2016-06-25 (×3): 500 mg via INTRAVENOUS
  Filled 2016-06-24 (×5): qty 50

## 2016-06-24 MED ORDER — SODIUM BICARBONATE 8.4 % IV SOLN
INTRAVENOUS | Status: DC
  Administered 2016-06-24 (×2): via INTRAVENOUS
  Filled 2016-06-24 (×3): qty 150

## 2016-06-24 MED ORDER — ORAL CARE MOUTH RINSE
15.0000 mL | Freq: Two times a day (BID) | OROMUCOSAL | Status: DC
  Administered 2016-06-24 – 2016-06-25 (×2): 15 mL via OROMUCOSAL

## 2016-06-24 MED ORDER — FAMOTIDINE 20 MG PO TABS: 20.0000 mg | ORAL_TABLET | ORAL | Status: DC

## 2016-06-24 NOTE — Evaluation (Signed)
Clinical/Bedside Swallow Evaluation Patient Details  Name: Heather Zuniga MRN: [id number] Date of Birth: 08/12/1922  Today's Date: 06/24/2016 Time: SLP Start Time (ACUTE ONLY): 1330 SLP Stop Time (ACUTE ONLY): 1430 SLP Time Calculation (min) (ACUTE ONLY): 60 min  Past Medical History:  Past Medical History:  Diagnosis Date  . Anemia   . Arthritis   . CKD (chronic kidney disease) stage 3, GFR 30-59 ml/min   . Depression   . Edema   . GERD (gastroesophageal reflux disease)   . Pyuria    Past Surgical History:  Past Surgical History:  Procedure Laterality Date  . NO PAST SURGERIES     HPI:  Pt  is a 82 y.o. female with a known history of CKD stage 3, anemia, depression and gerd who was recently hospitalized AKI and hyperkalemia. Patient was kept in the hospital w/ diuretics were held. She was subsequently discharged to rehabilitation facility. Patient at the skilled facility has not been eating or drinking and has been receiving IV fluids for the past 5 days according to her daughter. Blood work checked and was noted to have worsening renal function and was sent to the emergency room. Patient's noted to have a creatinine of 8 and a GFR of 4. She is also noticed to have alkaline phosphatase the significantly elevated in the thousand range. Patient has not had any falls. Currently NPO.    Assessment / Plan / Recommendation Clinical Impression  Pt appears at reduced risk for aspiration w/ modified diet of Puree and thin liquids following aspiration precautions; feeding support.  Min increased risk for aspiration is present d/t presenting Cognitive decline and overall drowsiness and weakness. Pt is also refusing much po's despite encouragement. Pt's oral phase c/b adequate oral clearing given time; no overt weakness noted. Pt requires full feeding assistance and aspiration precautions. Recommend a Dysphagia 1 w/ thin liquids diet w/ aspiration precautions; monitoring for toleration. Recommend  medications in puree - crushed; full feeding support.     Aspiration Risk   (reduced w/ modified diet; feeding support)    Diet Recommendation  Dysphagia 1 w/ thin liquids; aspiration precautions; feeding support at meals - encouragement. Supplements.   Medication Administration: Crushed with puree (as able)    Other  Recommendations Recommended Consults:  (Dietician f/u ) Oral Care Recommendations: Oral care BID;Staff/trained caregiver to provide oral care   Follow up Recommendations None (TBD)      Frequency and Duration            Prognosis Prognosis for Safe Diet Advancement: Fair Barriers to Reach Goals: Cognitive deficits;Behavior;Severity of deficits Barriers/Prognosis Comment: pt has been refusing oral intake at skilled facility; currently      Swallow Study   General Date of Onset: 06/23/16 HPI: Pt  is a 82 y.o. female with a known history of CKD stage 3, anemia, depression and gerd who was recently hospitalized AKI and hyperkalemia. Patient was kept in the hospital w/ diuretics were held. She was subsequently discharged to rehabilitation facility. Patient at the skilled facility has not been eating or drinking and has been receiving IV fluids for the past 5 days according to her daughter. Blood work checked and was noted to have worsening renal function and was sent to the emergency room. Patient's noted to have a creatinine of 8 and a GFR of 4. She is also noticed to have alkaline phosphatase the significantly elevated in the thousand range. Patient has not had any falls. Currently NPO.  Type of Study: Bedside  Swallow Evaluation Previous Swallow Assessment: none indicated Diet Prior to this Study:  (unknown but not taking oral intake per H&P; IVs) Temperature Spikes Noted: No (wbc elevated) Respiratory Status: Room air History of Recent Intubation: No Behavior/Cognition: Cooperative;Pleasant mood;Confused;Distractible;Requires cueing (eyes closed; verbalized  intermittently when addressed) Oral Cavity Assessment:  (unable to fully assess) Oral Care Completed by SLP: Recent completion by staff Oral Cavity - Dentition: Missing dentition Vision:  (n/a) Self-Feeding Abilities: Total assist Patient Positioning: Upright in bed Baseline Vocal Quality: Low vocal intensity (w/ intermittent verbalizations) Volitional Cough: Cognitively unable to elicit Volitional Swallow: Unable to elicit    Oral/Motor/Sensory Function Overall Oral Motor/Sensory Function:  (unable to fully assess d/t Cognition)   Ice Chips Ice chips: Within functional limits Presentation: Spoon (fed; 3 trials)   Thin Liquid Thin Liquid: Within functional limits Presentation: Straw (fed; ~3-4 ozs total) Other Comments: verbal cues    Nectar Thick Nectar Thick Liquid: Not tested   Honey Thick Honey Thick Liquid: Not tested   Puree Puree: Within functional limits Presentation: Spoon (fed; 5 trials) Other Comments: refused anything further despite encouragement   Solid   GO   Solid: Not tested         Heather Som, MS, CCC-SLP Heather Zuniga 06/24/2016,3:05 PM

## 2016-06-24 NOTE — Progress Notes (Signed)
Subjective:  Heather Zuniga admitted on 06/23/2016 for Acute renal failure (ARF) (HCC) [N17.9] Acute renal failure superimposed on chronic kidney disease, unspecified CKD stage, unspecified acute renal failure type (HCC) [N17.9, N18.9]   Creatinine 8.12 - started on IV bicarb.  Confused.   Was recently here from 10/31 to 11/3.   Objective:  Vital signs in last 24 hours:  Temp:  [97.4 F (36.3 C)-97.7 F (36.5 C)] 97.4 F (36.3 C) (11/16 0430) Pulse Rate:  [61-79] 66 (11/16 0430) Resp:  [12-22] 16 (11/16 0430) BP: (90-144)/(49-98) 110/71 (11/16 0430) SpO2:  [93 %-99 %] 96 % (11/16 0430) Weight:  [94.2 kg (207 lb 9.6 oz)-94.4 kg (208 lb 1.6 oz)] 94.4 kg (208 lb 1.6 oz) (11/16 0500)  Weight change:  Filed Weights   06/24/16 0100 06/24/16 0500  Weight: 94.2 kg (207 lb 9.6 oz) 94.4 kg (208 lb 1.6 oz)    Intake/Output:    Intake/Output Summary (Last 24 hours) at 06/24/16 1042 Last data filed at 06/24/16 3220  Gross per 24 hour  Intake           618.62 ml  Output              200 ml  Net           418.62 ml     Physical Exam: General: No acute distress, laying in the bed   HEENT moist oral mucous membranes   Neck Supple   Pulm/lungs Normal effort, scattered rhonchi   CVS/Heart Irregular   Abdomen:  Soft, mildly distended   Extremities: No dependent edema   Neurologic: Alert, follows commands   Skin: Normal turgor           Basic Metabolic Panel:   Recent Labs Lab 06/23/16 1858 06/24/16 0502  NA 130* 132*  K 5.1 5.3*  CL 96* 101  CO2 18* 16*  GLUCOSE 75 68  BUN 103* 100*  CREATININE 8.12* 8.01*  CALCIUM 8.2* 7.8*     CBC:  Recent Labs Lab 06/23/16 1858 06/24/16 0502  WBC 13.8* 14.0*  NEUTROABS 11.2*  --   HGB 9.1* 9.1*  HCT 29.4* 29.3*  MCV 107.2* 107.0*  PLT 283 267      Microbiology:  Recent Results (from the past 720 hour(s))  Urine culture     Status: Abnormal   Collection Time: 06/08/16  2:36 PM  Result Value Ref Range Status   Specimen Description URINE, CLEAN CATCH  Final   Special Requests NONE  Final   Culture (A)  Final    >=100,000 COLONIES/mL ESCHERICHIA COLI Confirmed Extended Spectrum Beta-Lactamase Producer (ESBL) Performed at [facility]    Report Status 06/11/2016 FINAL  Final   Organism ID, Bacteria ESCHERICHIA COLI (A)  Final      Susceptibility   Escherichia coli - MIC*    AMPICILLIN >=32 RESISTANT Resistant     CEFAZOLIN >=64 RESISTANT Resistant     CEFTRIAXONE >=64 RESISTANT Resistant     CIPROFLOXACIN >=4 RESISTANT Resistant     GENTAMICIN <=1 SENSITIVE Sensitive     IMIPENEM <=0.25 SENSITIVE Sensitive     NITROFURANTOIN <=16 SENSITIVE Sensitive     TRIMETH/SULFA <=20 SENSITIVE Sensitive     AMPICILLIN/SULBACTAM 16 INTERMEDIATE Intermediate     PIP/TAZO <=4 SENSITIVE Sensitive     Extended ESBL POSITIVE Resistant     * >=100,000 COLONIES/mL ESCHERICHIA COLI  MRSA PCR Screening     Status: None   Collection Time: 06/11/16  4:58 AM  Result Value Ref Range Status   MRSA by PCR NEGATIVE NEGATIVE Final    Comment:        The GeneXpert MRSA Assay (FDA approved for NASAL specimens only), is one component of a comprehensive MRSA colonization surveillance program. It is not intended to diagnose MRSA infection nor to guide or monitor treatment for MRSA infections.     Coagulation Studies: No results for input(s): LABPROT, INR in the last 72 hours.  Urinalysis:  Recent Labs  06/23/16 2046  COLORURINE AMBER*  LABSPEC 1.024  PHURINE 5.0  GLUCOSEU NEGATIVE  HGBUR 1+*  BILIRUBINUR 1+*  KETONESUR NEGATIVE  PROTEINUR 30*  NITRITE NEGATIVE  LEUKOCYTESUR TRACE*      Imaging: US Abdomen Complete  Result Date: 06/24/2016 CLINICAL DATA:  Renal failure with elevated bilirubin. EXAM: ABDOMEN ULTRASOUND COMPLETE COMPARISON:  06/09/2016 FINDINGS: Gallbladder: Gallbladder lumen stone filled, generating "Wall -echo -shadow sign" . Gallbladder wall measurement not reliable in  this setting although a degree of gallbladder wall thickening is suspected. The sonographer reports no sonographic Murphy sign. Common bile duct: Diameter: 6 mm Liver: Liver contour appears nodular in some regions, suggesting cirrhosis. Subcapsular irregular cystic lesion measuring up to 5.2 cm identified posteriorly in the right lobe. IVC: No abnormality visualized. Pancreas: Not well seen. Spleen: Not visualized, potentially due to overlying bowel gas. Right Kidney: Length: 9.3 cm. Tiny upper pole cystic lesion measures 1.3 cm maximum dimension. More dominant cyst seen on the previous ultrasound exam corresponds to the lesion which appears to be subcapsular in the liver on today's study. No hydronephrosis. Left Kidney: Length: 7.2 cm. Not well seen secondary to bowel gas. No hydronephrosis. Abdominal aorta: Mid and distal aorta obscured by bowel gas. Other findings: Moderate volume ascites. IMPRESSION: 1. Gallbladder lumen filled with stones limiting assessment gallbladder wall thickness. The sonographer reports no sonographic Murphy sign. 2. Nodular hepatic contour with heterogeneous echotexture raises the question of cirrhosis. No intra or extrahepatic biliary duct dilatation evident. 3. Irregular cystic lesion measuring approximately 5 cm identified in the region of Morison's pouch. Previous ultrasound exam characterize this as renal in origin, but today's exam suggests that this is a subcapsular liver lesion. 4. Moderate ascites 5. Limited study secondary to large volume bowel gas. 6. Given the constellation of findings, cross-sectional imaging by CT or MRI may prove helpful to further evaluate. Electronically Signed   By: Kennith Center M.D.   On: 06/24/2016 09:42     Medications:   .  sodium bicarbonate  infusion 1000 mL 125 mL/hr at 06/24/16 0734   . famotidine (PEPCID) IV  20 mg Intravenous Q12H  . feeding supplement (ENSURE ENLIVE)  237 mL Oral BID BM  . heparin  5,000 Units Subcutaneous Q8H  .  latanoprost  1 drop Left Eye QHS  . levothyroxine  25 mcg Oral QAC breakfast  . sodium chloride flush  3 mL Intravenous Q12H  . timolol  2 drop Left Eye BID   acetaminophen **OR** acetaminophen, albuterol, docusate sodium, ondansetron **OR** ondansetron (ZOFRAN) IV  Assessment/ Plan:  82 y.o. black female with arthritis, chronic edema, chronic kidney disease, was admitted on 06/08/2016 with generalized weakness and is found to have acute renal failure and hyperkalemia.   1.  Acute renal failure with hyperkalemia and metabolic acidosis:: on chronic kidney disease stage III. Baseline GFR of 32 from 10/27/15 Anuric, foley placed.  - Continue bicarb gtt - encourage PO intake - off diuretics.    LOS: 1 Trenese Haft 11/16/201710:42 AM

## 2016-06-24 NOTE — Progress Notes (Signed)
[facility] - [place] at [facility]   PATIENT NAME: Nachelle Negrette    MR#:  [id number]  DATE OF BIRTH:  [birth year]  SUBJECTIVE:  CHIEF COMPLAINT:   Chief Complaint  Patient presents with  . Abnormal Lab  The patient is a 82 year old [place] American female with medical history significant for history of CAD stage III, anemia, depression, gastroesophageal reflux disease, who recently was hospitalized with acute renal failure and hyperkalemia and discharged to rehabilitation facility comes back to the hospital with poor oral intake over the past 5 days, worsening renal function. On arrival to the hospital, patient's creatinine was found to be above 8 and GFR 04. She also was noted to have markedly elevated alkaline phosphatase and transaminases. Abdominal ultrasound revealed significant ascites. Physical exam showed peripheral edema, ascitic fluid in abdomen. Patient does not make any urine, despite IV fluid administration. Nephrologist is considering hemodialysis versus palliative care, unable to reach family. Patient denies any pain.   Review of Systems  Unable to perform ROS: Dementia    VITAL SIGNS: Blood pressure 110/66, pulse 64, temperature 97.7 F (36.5 C), temperature source Axillary, resp. rate 16, height 5\' 1"  (1.549 m), weight 94.4 kg (208 lb 1.6 oz), SpO2 94 %.  PHYSICAL EXAMINATION:   GENERAL:  82 y.o.-year-old patient lying in the bed in mild to moderate distress, somewhat uncomfortable, somnolent, difficult to arouse.  EYES: Pupils equal, round, reactive to light and accommodation. No scleral icterus. Extraocular muscles intact.  HEENT: Head atraumatic, normocephalic. Oropharynx and nasopharynx clear.  NECK:  Supple, no jugular venous distention. No thyroid enlargement, no tenderness.  LUNGS: Diminished breath sounds bilaterally at bases, no wheezing, rales,rhonchi or crepitation. Intermittent use of accessory muscles of respiration, especially speech.   CARDIOVASCULAR: S1, S2 normal. No murmurs, rubs, or gallops.  ABDOMEN: Soft, diffusely uncomfortable to palpation, but no rebound or guarding was noted some pain in the epigastric pressure, voluntary guarding, nondistended, fluid wave was felt on percussion. Bowel sounds present. No organomegaly or mass.  EXTREMITIES: 2-3+ lower extremity and pedal edema, no cyanosis, or clubbing.  NEUROLOGIC: Cranial nerves II through XII are grossly intact. Muscle strength , unable to evaluate in all extremities. Due to poor effort. Sensation unable to evaluate. Gait not checked.  PSYCHIATRIC: The patient is somnolent, opens her eyes and briefly converses, disoriented, able to follow simple commands  SKIN: No obvious rash, lesion, or ulcer.   ORDERS/RESULTS REVIEWED:   CBC  Recent Labs Lab 06/23/16 1858 06/24/16 0502  WBC 13.8* 14.0*  HGB 9.1* 9.1*  HCT 29.4* 29.3*  PLT 283 267  MCV 107.2* 107.0*  MCH 33.3 33.1  MCHC 31.1* 31.0*  RDW 18.0* 19.0*  LYMPHSABS 1.4  --   MONOABS 1.1*  --   EOSABS 0.1  --   BASOSABS 0.0  --    ------------------------------------------------------------------------------------------------------------------  Chemistries   Recent Labs Lab 06/23/16 1858 06/24/16 0502  NA 130* 132*  K 5.1 5.3*  CL 96* 101  CO2 18* 16*  GLUCOSE 75 68  BUN 103* 100*  CREATININE 8.12* 8.01*  CALCIUM 8.2* 7.8*  AST 242*  --   ALT 115*  --   ALKPHOS 1,187*  --   BILITOT 11.7*  --    ------------------------------------------------------------------------------------------------------------------ estimated creatinine clearance is 4.8 mL/min (by C-G formula based on SCr of 8.01 mg/dL (H)). ------------------------------------------------------------------------------------------------------------------ No results for input(s): TSH, T4TOTAL, T3FREE, THYROIDAB in the last 72 hours.  Invalid input(s): FREET3  Cardiac Enzymes No results for input(s): CKMB,  TROPONINI, MYOGLOBIN  in the last 168 hours.  Invalid input(s): CK ------------------------------------------------------------------------------------------------------------------ Invalid input(s): POCBNP ---------------------------------------------------------------------------------------------------------------  RADIOLOGY: US Abdomen Complete  Result Date: 06/24/2016 CLINICAL DATA:  Renal failure with elevated bilirubin. EXAM: ABDOMEN ULTRASOUND COMPLETE COMPARISON:  06/09/2016 FINDINGS: Gallbladder: Gallbladder lumen stone filled, generating "Wall -echo -shadow sign" . Gallbladder wall measurement not reliable in this setting although a degree of gallbladder wall thickening is suspected. The sonographer reports no sonographic Murphy sign. Common bile duct: Diameter: 6 mm Liver: Liver contour appears nodular in some regions, suggesting cirrhosis. Subcapsular irregular cystic lesion measuring up to 5.2 cm identified posteriorly in the right lobe. IVC: No abnormality visualized. Pancreas: Not well seen. Spleen: Not visualized, potentially due to overlying bowel gas. Right Kidney: Length: 9.3 cm. Tiny upper pole cystic lesion measures 1.3 cm maximum dimension. More dominant cyst seen on the previous ultrasound exam corresponds to the lesion which appears to be subcapsular in the liver on today's study. No hydronephrosis. Left Kidney: Length: 7.2 cm. Not well seen secondary to bowel gas. No hydronephrosis. Abdominal aorta: Mid and distal aorta obscured by bowel gas. Other findings: Moderate volume ascites. IMPRESSION: 1. Gallbladder lumen filled with stones limiting assessment gallbladder wall thickness. The sonographer reports no sonographic Murphy sign. 2. Nodular hepatic contour with heterogeneous echotexture raises the question of cirrhosis. No intra or extrahepatic biliary duct dilatation evident. 3. Irregular cystic lesion measuring approximately 5 cm identified in the region of Morison's pouch. Previous ultrasound exam  characterize this as renal in origin, but today's exam suggests that this is a subcapsular liver lesion. 4. Moderate ascites 5. Limited study secondary to large volume bowel gas. 6. Given the constellation of findings, cross-sectional imaging by CT or MRI may prove helpful to further evaluate. Electronically Signed   By: Kennith Center M.D.   On: 06/24/2016 09:42    EKG:  Orders placed or performed during the hospital encounter of 10/27/15  . ED EKG  . ED EKG    ASSESSMENT AND PLAN:  Principal Problem:   Renal failure (ARF), acute on chronic (HCC) Active Problems:   GERD (gastroesophageal reflux disease)   Arthritis, degenerative   Transaminitis   Acute renal failure (ARF) (HCC)  #1. Acute on chronic renal failure, anuric, and throat consultation is appreciated, hemodialysis is recommended if family desires #2. Hyperkalemia, initiate patient on bicarbonate orally, discontinue IV bicarbonate, recheck potassium level later today, give Late if able to take by mouth, SLP is involved #3 elevated transaminases, abdominal ultrasound revealed liver abnormalities concerning for cirrhosis, patient denies any formal alcohol abuse, get him on a level. Gallbladder has stones, unable to evaluate thickness of gallbladder wall. Initiate meropenem due to concerns of cholecystitis/cholangitis #4. Leukocytosis, follow with therapy #5. Failure to thrive adult, questionable due to intra-abdominal infection, initiate antibiotic therapy   Management plans discussed with the patient, family and they are in agreement.   DRUG ALLERGIES: No Known Allergies  CODE STATUS:     Code Status Orders        Start     Ordered   06/23/16 2212  Do not attempt resuscitation (DNR)  Continuous    Question Answer Comment  In the event of cardiac or respiratory ARREST Do not call a "code blue"   In the event of cardiac or respiratory ARREST Do not perform Intubation, CPR, defibrillation or ACLS   In the event of  cardiac or respiratory ARREST Use medication by any route, position, wound care, and other measures to relive pain and  suffering. May use oxygen, suction and manual treatment of airway obstruction as needed for comfort.      06/23/16 2211    Code Status History    Date Active Date Inactive Code Status Order ID Comments User Context   06/08/2016  5:29 PM 06/11/2016  8:29 PM DNR [id number]  Shaune Pollack, MD Inpatient   10/27/2015  7:51 PM 10/31/2015  6:52 PM Full Code [id number]  Milagros Loll, MD ED      TOTAL Critical care TIME TAKING CARE OF THIS PATIENT: 45 minutes.    Katharina Caper M.D on 06/24/2016 at 12:57 PM  Between 7am to 6pm - Pager - [phone]  After 6pm go to [email or web address] - password [email or web address] [facility]  [facility]  Office  [phone]  CC: Primary care physician; No PCP Per Patient

## 2016-06-24 NOTE — Consult Note (Signed)
Consultation Note Date: 06/24/2016   Patient Name: Heather Zuniga  DOB: [birth year]  MRN: [id number]  Age / Sex: 82 y.o., female  PCP: No Pcp Per Patient Referring Physician: Theodoro Grist, MD  Reason for Consultation: Establishing goals of care and Hospice Evaluation  HPI/Patient Profile: 82 y.o. female  with past medical history of GERD, depression, arthritis, peripheral edema, anemia, and CKD stage III admitted on 06/23/2016 with worsening renal function. Recent hospitalization with AKI and hyperkalemia. Discharged to [facility] for rehab. At the facility, she has had poor oral intake and per daughter, was receiving IVF for 5 days. Blood work at nursing facility revealed creatinine of 8 and GFR of 4. On admit, also noted to have alkaline phosphatase. Palliative medicine consultation for goals of care/hospice evaluation.    Clinical Assessment and Goals of Care: Initially met patient at bedside. Compared to my last visit with her during previous hospitalization, patient is more drowsy and intermittently confused. She answers questions and follows commands, but tells me she just wants to get some sleep and to call her daughter for information.   Met with daughter and POA, Heather Zuniga. She tells me her mother has been declining the past month. She is never interested in eating and "always full." Other than range of motion exercises, Heather Zuniga tells me her mother was not participating at rehab and they had not gotten her out of bed. She has been on IVF at the nursing facility for five days with no improvement in kidney function or cognition.   Discussed in detail patient's medical condition and need for dialysis at this point. Discussed my concern that even if kidney functions improve, I fear that this will become a cycle with dehydration due to poor oral intake and will to eat. Discussed what dialysis would look like on a  weekly basis and that her mother would most likely not have a good quality of life because of her age and weakness from malnutrition. Her and her mother have not talked about dialysis but she wanted to hear her input and tells me she would respect her decision whether she chose to do dialysis or not.   Discussed with Heather Zuniga outcome if her mother did not want dialysis including poor prognosis due to worsening kidney functions, ascites/edema, low urine output, and poor PO intake. Discussed hospice services, comfort measures, and no escalation of care. Heather Zuniga seems to have a good understanding of our discussions and agrees with residential hospice if her mother would not want dialysis. Heather Zuniga tells me she is not surprised that she may be approaching the end of her life because she has seen her decline the past month. Also discussed trajectory and EOL expectations with end-stage renal disease.   Bernadette and I went back to her mother's room. Discussed kidney failure and the need for dialysis. Patient tells [place] that she is not interested in dialysis with her age. She understands that if she does not get dialysis, she will "die pretty quick." Discussed hospice facility and she agrees with this plan.  She tells me she has no fears or concerns about dying. She is a very spiritual individual. Spiritual services offered but she is not interested at this time.    Answered all questions. Hard Choices copy given. Spiritual and emotional support provided.   HCPOA-daughter Cherly Hensen)   SUMMARY OF RECOMMENDATIONS    DNR/DNI  GOC discussion with patient and daughter. Patient does not want dialysis and daughter respects this decision, knowing life prolonging interventions will be discontinued and time will be short.   Transitioning to comfort measures only.   Social work consult placed for residential hospice facility.   Updated Dr. Winona Legato.   PMT not at [facility] over the weekend but please do not  hesitate to call on Friday, 11/17 if I can further assist/answer any questions for family.   Code Status/Advance Care Planning:  DNR   Symptom Management:   Per attending  Palliative Prophylaxis:   Aspiration, Delirium Protocol, Oral Care and Turn Reposition  Psycho-social/Spiritual:   Desire for further Chaplaincy support:no  Additional Recommendations: Caregiving  Support/Resources and Education on Hospice  Prognosis:   < 2 weeks-ESRD with worsening kidney functions, oliguria, ascites, functional and nutritional status decline, and overall failure to thrive.   Discharge Planning: Hospice facility      Primary Diagnoses: Present on Admission: . Renal failure (ARF), acute on chronic (HCC) . GERD (gastroesophageal reflux disease) . Arthritis, degenerative . Transaminitis . Acute renal failure (ARF) (HCC)   I have reviewed the medical record, interviewed the patient and family, and examined the patient. The following aspects are pertinent.  Past Medical History:  Diagnosis Date  . Anemia   . Arthritis   . CKD (chronic kidney disease) stage 3, GFR 30-59 ml/min   . Depression   . Edema   . GERD (gastroesophageal reflux disease)   . Pyuria    Social History   Social History  . Marital status: Divorced    Spouse name: N/A  . Number of children: N/A  . Years of education: N/A   Social History Main Topics  . Smoking status: Never Smoker  . Smokeless tobacco: Never Used  . Alcohol use No  . Drug use: No  . Sexual activity: Not Asked   Other Topics Concern  . None   Social History Narrative  . None   Family History  Problem Relation Age of Onset  . Rheum arthritis Other    Scheduled Meds: . [START ON 06/26/2016] famotidine  20 mg Oral Q48H  . feeding supplement  1 Container Oral TID BM  . heparin  5,000 Units Subcutaneous Q8H  . latanoprost  1 drop Left Eye QHS  . levothyroxine  25 mcg Oral QAC breakfast  . mouth rinse  15 mL Mouth Rinse BID  .  meropenem  500 mg Intravenous Q8H  . sodium chloride flush  3 mL Intravenous Q12H  . timolol  2 drop Left Eye BID   Continuous Infusions: PRN Meds:.acetaminophen **OR** acetaminophen, albuterol, docusate sodium, ondansetron **OR** ondansetron (ZOFRAN) IV Medications Prior to Admission:  Prior to Admission medications   Medication Sig Start Date End Date Taking? Authorizing Provider  acetaminophen (TYLENOL) 650 MG CR tablet Take 1,300 mg by mouth 2 (two) times daily.   Yes Historical Provider, MD  albuterol (PROVENTIL HFA;VENTOLIN HFA) 108 (90 Base) MCG/ACT inhaler Inhale 2 puffs into the lungs every 4 (four) hours as needed for wheezing or shortness of breath.   Yes Historical Provider, MD  docusate sodium (COLACE) 100 MG capsule Take  1 capsule (100 mg total) by mouth 2 (two) times daily as needed for mild constipation. 06/11/16  Yes Loletha Grayer, MD  feeding supplement, ENSURE ENLIVE, (ENSURE ENLIVE) LIQD Take 237 mLs by mouth 2 (two) times daily between meals. 06/11/16  Yes Loletha Grayer, MD  lactobacillus acidophilus (BACID) TABS tablet Take 2 tablets by mouth 3 (three) times daily. 06/11/16  Yes Richard Leslye Peer, MD  latanoprost (XALATAN) 0.005 % ophthalmic solution Place 1 drop into the left eye at bedtime.    Yes Historical Provider, MD  levothyroxine (SYNTHROID, LEVOTHROID) 25 MCG tablet Take 25 mcg by mouth daily before breakfast.   Yes Historical Provider, MD  mirtazapine (REMERON) 7.5 MG tablet Take 1 tablet (7.5 mg total) by mouth at bedtime. 06/11/16  Yes Loletha Grayer, MD  Multiple Vitamin (MULTIVITAMIN WITH MINERALS) TABS tablet Take 1 tablet by mouth daily.   Yes Historical Provider, MD  ranitidine (ZANTAC) 150 MG tablet Take 150 mg by mouth 2 (two) times daily.   Yes Historical Provider, MD  sulfamethoxazole-trimethoprim (BACTRIM DS,SEPTRA DS) 800-160 MG tablet Take 1 tablet by mouth 2 (two) times daily. 0301,3143   Yes Historical Provider, MD  timolol (TIMOPTIC) 0.5 %  ophthalmic solution Place 2 drops into the left eye 2 (two) times daily.    Yes Historical Provider, MD  white petrolatum (SM PETROLEUM JELLY) GEL Apply 1 application topically 2 (two) times daily. Apply to bilateral lower extremities twice a day   Yes Historical Provider, MD  fosfomycin (MONUROL) 3 g PACK One dose on MOnday 06/14/2016 and one dose on Thursday 06/17/2016 Patient not taking: Reported on 06/23/2016 06/11/16   Loletha Grayer, MD   No Known Allergies Review of Systems  Unable to perform ROS: Other   Physical Exam  Constitutional: She is easily aroused. She appears ill.  Cardiovascular: Regular rhythm and normal heart sounds.   Pulmonary/Chest: She has decreased breath sounds.  Abdominal: Soft. Bowel sounds are normal. There is no tenderness.  Neurological: She is alert and easily aroused.  Oriented to person and place  Skin: Skin is warm and dry.  Psychiatric: She has a normal mood and affect. Her speech is normal. She is withdrawn. She is inattentive.  Nursing note and vitals reviewed.  Vital Signs: BP (!) 93/55 (BP Location: Right Arm)   Pulse 69   Temp 97.8 F (36.6 C) (Oral)   Resp 18   Ht _0  (1.549 m)   Wt 94.4 kg (208 lb 1.6 oz)   SpO2 94%   BMI 39.32 kg/m  Pain Assessment: Faces   Pain Score: 0-No pain  SpO2: SpO2: 94 % O2 Device:SpO2: 94 % O2 Flow Rate: .   IO: Intake/output summary:   Intake/Output Summary (Last 24 hours) at 06/24/16 2132 Last data filed at 06/24/16 1349  Gross per 24 hour  Intake          1420.62 ml  Output              275 ml  Net          1145.62 ml    LBM: Last BM Date: 06/24/16 Baseline Weight: Weight: 94.2 kg (207 lb 9.6 oz) Most recent weight: Weight: 94.4 kg (208 lb 1.6 oz)     Palliative Assessment/Data: PPS 20%   Flowsheet Rows   Flowsheet Row Most Recent Value  Intake Tab  Referral Department  Hospitalist  Unit at Time of Referral  Med/Surg Unit  Palliative Care Primary Diagnosis  Nephrology  Date Notified  06/24/16  Palliative Care Type  Return patient Palliative Care  Reason for referral  Clarify Goals of Care  Date of Admission  06/23/16  Date first seen by Palliative Care  06/24/16  # of days Palliative referral response time  0 Day(s)  # of days IP prior to Palliative referral  1  Clinical Assessment  Palliative Performance Scale Score  20%  Psychosocial & Spiritual Assessment  Palliative Care Outcomes  Patient/Family meeting held?  Yes  Who was at the meeting?  daughter Heather Zuniga  Palliative Care Outcomes  Clarified goals of care, Counseled regarding hospice, Provided end of life care assistance, Provided psychosocial or spiritual support, Changed to focus on comfort     Time In: 1510 Time Out: 1620 Time Total: 4mn Greater than 50%  of this time was spent counseling and coordinating care related to the above assessment and plan.  Signed by:  MIhor Dow FNP-C Palliative Medicine Team  Phone: [phone]: [phone]: [phone]  Please contact Palliative Medicine Team phone at [phone] questions and concerns.  For individual provider: See AShea Evans

## 2016-06-24 NOTE — Consult Note (Signed)
Patient ID: Heather Zuniga, female   DOB: 1922-10-21, 82 y.o.   MRN: [id number]  HPI Heather Zuniga is a 82 y.o. female who is currently lives the medicine service secondary to laboratory abdomen is consistent with acute renal failure. Surgery consult was requested by Dr.Vaickute due to possible concerns of cholangitis. History is difficult to obtain from the patient due to baseline dementia. However upon questioning the patient she denies any pain but she is also not oriented. Per review of the chart patient was recently transferred from a skilled nursing facility secondary to acute worsening of her renal function and decreased oral intake. She was also found to have a markedly elevated alkaline phosphatase and bilirubin.  HPI  Past Medical History:  Diagnosis Date  . Anemia   . Arthritis   . CKD (chronic kidney disease) stage 3, GFR 30-59 ml/min   . Depression   . Edema   . GERD (gastroesophageal reflux disease)   . Pyuria     Past Surgical History:  Procedure Laterality Date  . NO PAST SURGERIES      Family History  Problem Relation Age of Onset  . Rheum arthritis Other     Social History Social History  Substance Use Topics  . Smoking status: Never Smoker  . Smokeless tobacco: Never Used  . Alcohol use No    No Known Allergies  Current Facility-Administered Medications  Medication Dose Route Frequency Provider Last Rate Last Dose  . acetaminophen (TYLENOL) tablet 650 mg  650 mg Oral Q6H PRN Auburn Bilberry, MD       Or  . acetaminophen (TYLENOL) suppository 650 mg  650 mg Rectal Q6H PRN Auburn Bilberry, MD      . albuterol (PROVENTIL) (2.5 MG/3ML) 0.083% nebulizer solution 2.5 mg  2.5 mg Inhalation Q4H PRN Auburn Bilberry, MD      . docusate sodium (COLACE) capsule 100 mg  100 mg Oral BID PRN Auburn Bilberry, MD      . Melene Muller ON 06/26/2016] famotidine (PEPCID) tablet 20 mg  20 mg Oral Q48H Rima Vaickute, MD      . feeding supplement (BOOST / RESOURCE BREEZE) liquid 1 Container   1 Container Oral TID BM Katharina Caper, MD      . heparin injection 5,000 Units  5,000 Units Subcutaneous Q8H Auburn Bilberry, MD   5,000 Units at 06/24/16 1339  . latanoprost (XALATAN) 0.005 % ophthalmic solution 1 drop  1 drop Left Eye QHS Auburn Bilberry, MD      . levothyroxine (SYNTHROID, LEVOTHROID) tablet 25 mcg  25 mcg Oral QAC breakfast Auburn Bilberry, MD   25 mcg at 06/24/16 0734  . meropenem (MERREM) IVPB SOLR 500 mg  500 mg Intravenous Q8H Katharina Caper, MD   500 mg at 06/24/16 1545  . ondansetron (ZOFRAN) tablet 4 mg  4 mg Oral Q6H PRN Auburn Bilberry, MD       Or  . ondansetron (ZOFRAN) injection 4 mg  4 mg Intravenous Q6H PRN Auburn Bilberry, MD      . sodium chloride flush (NS) 0.9 % injection 3 mL  3 mL Intravenous Q12H Auburn Bilberry, MD   3 mL at 06/24/16 1058  . timolol (TIMOPTIC) 0.5 % ophthalmic solution 2 drop  2 drop Left Eye BID Auburn Bilberry, MD   2 drop at 06/24/16 1059     Review of Systems Unable to be obtained secondary to mental  Physical Exam Blood pressure 110/66, pulse 64, temperature 97.7 F (36.5 C), temperature source Axillary,  resp. rate 16, height 5\' 1"  (1.549 m), weight 94.4 kg (208 lb 1.6 oz), SpO2 94 %. CONSTITUTIONAL: Resting in bed in no acute distress. Easily arousable to voice EYES: Pupils are equal, round appears to be jaundiced EARS, NOSE, MOUTH AND THROAT: The oropharynx is clear. The oral mucosa is pink and moist. Hearing is intact to voice. LYMPH NODES:  Lymph nodes in the neck are grossly normal. RESPIRATORY:  Lungs are coarse bilaterally.  CARDIOVASCULAR: Heart is regular GI: The abdomen is soft, nontender, and mildly distended with a palpable fluid wave. There are no palpable masses. There is no obvious hepatosplenomegaly but difficult to ascertain due to body habitus. There are normal bowel sounds in all quadrants. GU: Rectal deferred.   MUSCULOSKELETAL: Obvious edema to all extremities   SKIN: no apparent skin lesions or  ulcers. NEUROLOGIC: Motor and sensation is difficult to ascertain due to mental status PSYCH:  Oriented to person, but not to place or time  Data Reviewed Images and labs reviewed. Labs concerning for a leukocytosis of 14,000, creatinine of 8, BUN of 100, potassium of 5.3, sodium of 132. Labs on admission show alkaline phosphatase of nearly 1200, albumin of 2.5, AST of 242, ALT of 1:15, total bilirubin of 11.7 and ammonia of 43. Ultrasound the abdomen was obtained which shows numerous gallstones as well as evidence of cirrhosis and ascites. Unable to ascertain for wall thickness or pericholecystic fluid but there is ascites. The common bile duct is normal at 6 mm. I have personally reviewed the patient's imaging, laboratory findings and medical records.    Assessment    Acute renal failure and likely undiagnosed malignancy    Plan    82 year old demented female with acute renal failure with painless jaundice. In this setting, without ductal dilatation or obvious pain it is likely there is an undiagnosed malignancy causing her painless jaundice. There is currently no imaging or physical exam findings that would indicate cholecystitis or cholangitis. However there are no cross-sectional images. Currently no indications for any surgical intervention in this pleasantly demented elderly female with acute renal failure. If further evaluation is desired by the family a CT scan or MRI of the abdomen could be beneficial but would likely only be for confirmation and not for therapy. Please call if general surgery can be of any further assistance with this patient.     Time spent with the patient was 80 minutes, with more than 50% of the time spent in face-to-face education, counseling and care coordination.     84, MD FACS General Surgeon 06/24/2016, 4:05 PM

## 2016-06-24 NOTE — Progress Notes (Signed)
Initial Nutrition Assessment  DOCUMENTATION CODES:   Severe malnutrition in context of chronic illness  INTERVENTION:  Discontinued Ensure Enlive in setting of ARF with hyperkalemia.  Will provide Boost Breeze po TID, each supplement provides 250 kcal and 9 grams of protein. Lower potassium and phosphorus content.  Will monitor discussions regarding goals of care.  NUTRITION DIAGNOSIS:   Malnutrition (Severe) related to chronic illness as evidenced by 14.4% percent weight loss over 7 months, severe fluid accumulation.  GOAL:   Patient will meet greater than or equal to 90% of their needs  MONITOR:   PO intake, Supplement acceptance, Labs, Weight trends, I & O's  REASON FOR ASSESSMENT:   Low Braden    ASSESSMENT:   82 year old African American female with medical history significant for history of CAD stage III, anemia, depression, gastroesophageal reflux disease, who recently was hospitalized with acute renal failure and hyperkalemia and discharged to rehabilitation facility comes back to the hospital with poor oral intake over the past 5 days, worsening renal function. Abdominal ultrasound revealed significant ascites. Physical exam showed peripheral edema, ascitic fluid in abdomen. Patient does not make any urine, despite IV fluid administration. Nephrologist is considering hemodialysis versus palliative care, unable to reach family.   -Palliative Medicine consult in for assist with goals of care and for renal failure, not eating, discuss hospice. Will monitor outcome of discussions. -No SLP note in yet, but sign in patient's room is for pureed diet with thin liquids.  Attempted to speak to patient at bedside. She is very lethargic/confused and was not able to answer questions appropriately. Per chart patient was not eating or drinking at rehab. Per nursing student patient has had 1/2 of an Ensure, still waiting on diet order.   Patient had lost 30 lbs (14.4% body weight)  over 7 months, which is significant for time frame. Current body weight of 208 lbs is likely falsely elevated in setting of volume overload.  Estimated needs in setting of renal failure. Will need to update if patient does begin HD.  Medications reviewed and include: famotidine, levothyroxine.  Labs reviewed: Sodium 132, Potassium 5.3, BUN 100, Creatinine 8.01, EGFR 4.  Unable to complete Nutrition-Focused Physical Exam per patient's request to let her keep sleeping and listening to music. Noticeable severe edema on extremities as recorded in chart - would likely affect ability to identify wasting. Per NFPE completed 2 weeks ago, patient had moderate wasting of both fat and muscle.  Patient meets criteria for severe chronic malnutrition in setting of 14.4% weight loss in 7 months, severe fluid accumulation.  Discussed with RN. Patient's diet order will be entered pending SLP evaluation.  Diet Order:     Skin:  Reviewed, no issues  Last BM:  06/24/2016  Height:   Ht Readings from Last 1 Encounters:  06/24/16 5' 1"  (1.549 m)    Weight:   Wt Readings from Last 1 Encounters:  06/24/16 208 lb 1.6 oz (94.4 kg)    Ideal Body Weight:  47.72 kg  BMI:  Body mass index is 39.32 kg/m.  Estimated Nutritional Needs:   Kcal:  1400-1600 (MSJ x 1.2-1.4)  Protein:  65-80 grams (0.8-1 gram/kg)  Fluid:  per MD in setting of volume overload  EDUCATION NEEDS:   Education needs no appropriate at this time  Willey Blade, MS, RD, LDN Pager: [phone] After Hours Pager: [phone]

## 2016-06-25 DIAGNOSIS — D649 Anemia, unspecified: Secondary | ICD-10-CM

## 2016-06-25 DIAGNOSIS — E871 Hypo-osmolality and hyponatremia: Secondary | ICD-10-CM

## 2016-06-25 DIAGNOSIS — E875 Hyperkalemia: Secondary | ICD-10-CM

## 2016-06-25 DIAGNOSIS — R627 Adult failure to thrive: Secondary | ICD-10-CM

## 2016-06-25 DIAGNOSIS — D72829 Elevated white blood cell count, unspecified: Secondary | ICD-10-CM

## 2016-06-25 DIAGNOSIS — R17 Unspecified jaundice: Secondary | ICD-10-CM

## 2016-06-25 DIAGNOSIS — R8281 Pyuria: Secondary | ICD-10-CM

## 2016-06-25 DIAGNOSIS — E46 Unspecified protein-calorie malnutrition: Secondary | ICD-10-CM

## 2016-06-25 DIAGNOSIS — R1084 Generalized abdominal pain: Secondary | ICD-10-CM

## 2016-06-25 LAB — URINE CULTURE: Culture: NO GROWTH

## 2016-06-25 LAB — RENAL FUNCTION PANEL
ANION GAP: 15 (ref 5–15)
Albumin: 2.3 g/dL — ABNORMAL LOW (ref 3.5–5.0)
BUN: 107 mg/dL — AB (ref 6–20)
CHLORIDE: 99 mmol/L — AB (ref 101–111)
CO2: 18 mmol/L — ABNORMAL LOW (ref 22–32)
Calcium: 7.7 mg/dL — ABNORMAL LOW (ref 8.9–10.3)
Creatinine, Ser: 8.79 mg/dL — ABNORMAL HIGH (ref 0.44–1.00)
GFR, EST AFRICAN AMERICAN: 4 mL/min — AB (ref >60)
GFR, EST NON AFRICAN AMERICAN: 3 mL/min — AB (ref >60)
Glucose, Bld: 76 mg/dL (ref 65–99)
POTASSIUM: 4.9 mmol/L (ref 3.5–5.1)
Phosphorus: 6.6 mg/dL — ABNORMAL HIGH (ref 2.5–4.6)
Sodium: 132 mmol/L — ABNORMAL LOW (ref 135–145)

## 2016-06-25 LAB — CBC
HEMATOCRIT: 28.1 % — AB (ref 35.0–47.0)
Hemoglobin: 8.8 g/dL — ABNORMAL LOW (ref 12.0–16.0)
MCH: 33 pg (ref 26.0–34.0)
MCHC: 31.3 g/dL — ABNORMAL LOW (ref 32.0–36.0)
MCV: 105.2 fL — AB (ref 80.0–100.0)
Platelets: 283 10*3/uL (ref 150–440)
RBC: 2.67 MIL/uL — ABNORMAL LOW (ref 3.80–5.20)
RDW: 19.7 % — AB (ref 11.5–14.5)
WBC: 12.4 10*3/uL — AB (ref 3.6–11.0)

## 2016-06-25 LAB — PATHOLOGIST SMEAR REVIEW

## 2016-06-25 MED ORDER — MORPHINE SULFATE (CONCENTRATE) 10 MG/0.5ML PO SOLN: 10.0000 mg | ORAL | 0 refills | Status: AC | PRN

## 2016-06-25 MED ORDER — MEROPENEM-SODIUM CHLORIDE 500 MG/50ML IV SOLR
500.0000 mg | INTRAVENOUS | Status: DC
Start: 2016-06-26 — End: 2016-06-25

## 2016-06-25 NOTE — Progress Notes (Signed)
06/25/2016  12:30  Spoke with Clydie Braun from [facility].  Let me know that she had arrange transfer, transport and paperwork.  Nothing additional needed from nursing staff.  Bradly Chris, RN

## 2016-06-25 NOTE — Discharge Summary (Addendum)
[place] [facility] [place] NAME: Heather Zuniga    MR#:  [id number]  DATE OF BIRTH:  06-06-1923  DATE OF ADMISSION:  06/23/2016 ADMITTING PHYSICIAN: Dustin Flock, MD  DATE OF DISCHARGE: No discharge date for patient encounter.  PRIMARY CARE PHYSICIAN: No PCP Per Patient     ADMISSION DIAGNOSIS:  Acute renal failure (ARF) (HCC) [N17.9] Acute renal failure superimposed on chronic kidney disease, unspecified CKD stage, unspecified acute renal failure type ([place]) [N17.9, N18.9]  DISCHARGE DIAGNOSIS:  Principal Problem:   Renal failure (ARF), acute on chronic (HCC) Active Problems:   Acute renal failure (ARF) (HCC)   Acute renal failure superimposed on chronic kidney disease (HCC)   Hyperkalemia   Transaminitis   Jaundice   Diffuse abdominal pain   Leukocytosis   Failure to thrive in adult   Protein malnutrition (HCC)   Hyponatremia   Pyuria   GERD (gastroesophageal reflux disease)   Arthritis, degenerative   Encounter for hospice care discussion   Anemia   SECONDARY DIAGNOSIS:   Past Medical History:  Diagnosis Date  . Anemia   . Arthritis   . CKD (chronic kidney disease) stage 3, GFR 30-59 ml/min   . Depression   . Edema   . GERD (gastroesophageal reflux disease)   . Pyuria     .pro HOSPITAL COURSE:  The patient is a 82 year old African American female with medical history significant for history of CAD stage III, anemia, depression, gastroesophageal reflux disease, who recently was hospitalized with acute renal failure and hyperkalemia and discharged to rehabilitation facility comes back to the hospital with poor oral intake over the past 5 days, worsening renal function. On arrival to the hospital, patient's creatinine was found to be above 8 and GFR  Of 4. She also was noted to have markedly elevated alkaline phosphatase and transaminases, Total bilirubin. Abdominal ultrasound revealed significant ascites, gallstones.  Physical exam showed peripheral edema, ascitic fluid in abdomen. The patient was initiated on antibiotic therapy, IV fluids, however, continued not to make any urine consultation with nephrologist as well as surgeon were obtained, positive care was recommended. Palliative care medicine met with the family and discussed goals of care, decided on residential hospice  facility. Discussion by problem: #1. Acute on chronic renal failure, anuric, nephrologist, saw patient in consultation and the was considering hemodialysis. Patient's family, however, met with palliative care and decided on a residential hospice facility, where patient will likely be discharged today.  #2. Hyperkalemia, initially patient was managed on  bicarbonate intravenously, and later orally, however, when decision was made about comfort care only, medications were discontinued, comfort care/support was recommended #3 elevated transaminases, abdominal ultrasound revealed liver abnormalities concerning for cirrhosis, gallstones, but no bile duct dilatation, patient also has jaundice, we were concerned about possible cholangitis, initiated her on antibiotics intravenously, involved in general surgeon in patient's care. The  patient's  medical condition was  very poor, the surgeon did not recommend any surgical therapy but supportive care only. The  patient denied any former  alcohol abuse. Comfort care measures now  #4. Leukocytosis,. No intervention  #5. Failure to thrive adult,likely  intra-abdominal infection,comfort care measures only  DISCHARGE CONDITIONS:   Poor  CONSULTS OBTAINED:  Treatment Team:  Lavonia Dana, MD Clayburn Pert, MD  DRUG ALLERGIES:  No Known Allergies  DISCHARGE MEDICATIONS:   Current Discharge Medication List    START taking these medications   Details  Morphine Sulfate (MORPHINE CONCENTRATE) 10 MG/0.5ML  SOLN concentrated solution Take 0.5 mLs (10 mg total) by mouth every 3 (three) hours as needed  for moderate pain, severe pain or shortness of breath. Qty: 30 mL, Refills: 0      CONTINUE these medications which have NOT CHANGED   Details  albuterol (PROVENTIL HFA;VENTOLIN HFA) 108 (90 Base) MCG/ACT inhaler Inhale 2 puffs into the lungs every 4 (four) hours as needed for wheezing or shortness of breath.    feeding supplement, ENSURE ENLIVE, (ENSURE ENLIVE) LIQD Take 237 mLs by mouth 2 (two) times daily between meals. Qty: 60 Bottle, Refills: 0    latanoprost (XALATAN) 0.005 % ophthalmic solution Place 1 drop into the left eye at bedtime.     mirtazapine (REMERON) 7.5 MG tablet Take 1 tablet (7.5 mg total) by mouth at bedtime. Qty: 30 tablet, Refills: 0    timolol (TIMOPTIC) 0.5 % ophthalmic solution Place 2 drops into the left eye 2 (two) times daily.     white petrolatum (SM PETROLEUM JELLY) GEL Apply 1 application topically 2 (two) times daily. Apply to bilateral lower extremities twice a day      STOP taking these medications     acetaminophen (TYLENOL) 650 MG CR tablet      docusate sodium (COLACE) 100 MG capsule      lactobacillus acidophilus (BACID) TABS tablet      levothyroxine (SYNTHROID, LEVOTHROID) 25 MCG tablet      Multiple Vitamin (MULTIVITAMIN WITH MINERALS) TABS tablet      ranitidine (ZANTAC) 150 MG tablet      sulfamethoxazole-trimethoprim (BACTRIM DS,SEPTRA DS) 800-160 MG tablet      fosfomycin (MONUROL) 3 g PACK          DISCHARGE INSTRUCTIONS:    The patient is not recommended to have primary care physician's follow-up, comfort care measures  If you experience worsening of your admission symptoms, develop shortness of breath, life threatening emergency, suicidal or homicidal thoughts you must seek medical attention immediately by calling 911 or calling your MD immediately  if symptoms less severe.  You Must read complete instructions/literature along with all the possible adverse reactions/side effects for all the Medicines you take and that  have been prescribed to you. Take any new Medicines after you have completely understood and accept all the possible adverse reactions/side effects.   Please note  You were cared for by a hospitalist during your hospital stay. If you have any questions about your discharge medications or the care you received while you were in the hospital after you are discharged, you can call the unit and asked to speak with the hospitalist on call if the hospitalist that took care of you is not available. Once you are discharged, your primary care physician will handle any further medical issues. Please note that NO REFILLS for any discharge medications will be authorized once you are discharged, as it is imperative that you return to your primary care physician (or establish a relationship with a primary care physician if you do not have one) for your aftercare needs so that they can reassess your need for medications and monitor your lab values.    Today   CHIEF COMPLAINT:   Chief Complaint  Patient presents with  . Abnormal Lab    HISTORY OF PRESENT ILLNESS:  Heather Zuniga  is a 82 y.o. female with a known history of CAD stage III, anemia, depression, gastroesophageal reflux disease, who recently was hospitalized with acute renal failure and hyperkalemia and discharged to rehabilitation facility  comes back to the hospital with poor oral intake over the past 5 days, worsening renal function. On arrival to the hospital, patient's creatinine was found to be above 8 and GFR  Of 4. She also was noted to have markedly elevated alkaline phosphatase and transaminases, Total bilirubin. Abdominal ultrasound revealed significant ascites, gallstones. Physical exam showed peripheral edema, ascitic fluid in abdomen. The patient was initiated on antibiotic therapy, IV fluids, however, continued not to make any urine consultation with nephrologist as well as surgeon were obtained, positive care was recommended. Palliative  care medicine met with the family and discussed goals of care, decided on residential hospice  facility. Discussion by problem: #1. Acute on chronic renal failure, anuric, nephrologist, saw patient in consultation and the was considering hemodialysis. Patient's family, however, met with palliative care and decided on a residential hospice facility, where patient will likely be discharged today.  #2. Hyperkalemia, initially patient was managed on  bicarbonate intravenously, and later orally, however, when decision was made about comfort care only, medications were discontinued, comfort care/support was recommended #3 elevated transaminases, abdominal ultrasound revealed liver abnormalities concerning for cirrhosis, gallstones, but no bile duct dilatation, patient also has jaundice, we were concerned about possible cholangitis, initiated her on antibiotics intravenously, involved in general surgeon in patient's care. The  patient's  medical condition was  very poor, the surgeon did not recommend any surgical therapy but supportive care only. The  patient denied any former  alcohol abuse. Comfort care measures now  #4. Leukocytosis,. No intervention  #5. Failure to thrive adult,likely  intra-abdominal infection,comfort care measures only     VITAL SIGNS:  Blood pressure (!) 99/51, pulse 74, temperature 97.4 F (36.3 C), temperature source Oral, resp. rate 17, height 5' 1"  (1.549 m), weight 94.4 kg (208 lb 1.6 oz), SpO2 94 %.  I/O:    Intake/Output Summary (Last 24 hours) at 06/25/16 1417 Last data filed at 06/25/16 0903  Gross per 24 hour  Intake           168.03 ml  Output                0 ml  Net           168.03 ml    PHYSICAL EXAMINATION:  GENERAL:  82 y.o.-year-old patient lying in the bed with no acute distress.  EYES: Pupils equal, round, reactive to light and accommodation. No scleral icterus. Extraocular muscles intact.  HEENT: Head atraumatic, normocephalic. Oropharynx and  nasopharynx clear.  NECK:  Supple, no jugular venous distention. No thyroid enlargement, no tenderness.  LUNGS: Normal breath sounds bilaterally, no wheezing, rales,rhonchi or crepitation. No use of accessory muscles of respiration.  CARDIOVASCULAR: S1, S2 normal. No murmurs, rubs, or gallops.  ABDOMEN: Soft,Diffusely tender to palpation, but no rebound, however, voluntarily guarding was noted, non-distended. Bowel sounds present. No organomegaly or mass.  EXTREMITIES: No pedal edema, cyanosis, or clubbing.  NEUROLOGIC: Cranial nerves II through XII are intact. Muscle strength 5/5 in all extremities. Sensation intact. Gait not checked.  PSYCHIATRIC: The patient is alert and oriented x 3.  SKIN: No obvious rash, lesion, or ulcer.   DATA REVIEW:   CBC  Recent Labs Lab 06/25/16 0643  WBC 12.4*  HGB 8.8*  HCT 28.1*  PLT 283    Chemistries   Recent Labs Lab 06/24/16 1857 06/25/16 0643  NA 132* 132*  K 5.2* 4.9  CL 100* 99*  CO2 17* 18*  GLUCOSE 82 76  BUN 99* 107*  CREATININE 7.90* 8.79*  CALCIUM 7.8* 7.7*  AST 225*  --   ALT 106*  --   ALKPHOS 1,069*  --   BILITOT 10.9*  --     Cardiac Enzymes No results for input(s): TROPONINI in the last 168 hours.  Microbiology Results  Results for orders placed or performed during the hospital encounter of 06/23/16  Urine culture     Status: None   Collection Time: 06/23/16  8:46 PM  Result Value Ref Range Status   Specimen Description URINE, RANDOM  Final   Special Requests NONE  Final   Culture NO GROWTH Performed at [facility]   Final   Report Status 06/25/2016 FINAL  Final    RADIOLOGY:  US Abdomen Complete  Result Date: 06/24/2016 CLINICAL DATA:  Renal failure with elevated bilirubin. EXAM: ABDOMEN ULTRASOUND COMPLETE COMPARISON:  06/09/2016 FINDINGS: Gallbladder: Gallbladder lumen stone filled, generating "Wall -echo -shadow sign" . Gallbladder wall measurement not reliable in this setting although a  degree of gallbladder wall thickening is suspected. The sonographer reports no sonographic Murphy sign. Common bile duct: Diameter: 6 mm Liver: Liver contour appears nodular in some regions, suggesting cirrhosis. Subcapsular irregular cystic lesion measuring up to 5.2 cm identified posteriorly in the right lobe. IVC: No abnormality visualized. Pancreas: Not well seen. Spleen: Not visualized, potentially due to overlying bowel gas. Right Kidney: Length: 9.3 cm. Tiny upper pole cystic lesion measures 1.3 cm maximum dimension. More dominant cyst seen on the previous ultrasound exam corresponds to the lesion which appears to be subcapsular in the liver on today's study. No hydronephrosis. Left Kidney: Length: 7.2 cm. Not well seen secondary to bowel gas. No hydronephrosis. Abdominal aorta: Mid and distal aorta obscured by bowel gas. Other findings: Moderate volume ascites. IMPRESSION: 1. Gallbladder lumen filled with stones limiting assessment gallbladder wall thickness. The sonographer reports no sonographic Murphy sign. 2. Nodular hepatic contour with heterogeneous echotexture raises the question of cirrhosis. No intra or extrahepatic biliary duct dilatation evident. 3. Irregular cystic lesion measuring approximately 5 cm identified in the region of Morison's pouch. Previous ultrasound exam characterize this as renal in origin, but today's exam suggests that this is a subcapsular liver lesion. 4. Moderate ascites 5. Limited study secondary to large volume bowel gas. 6. Given the constellation of findings, cross-sectional imaging by CT or MRI may prove helpful to further evaluate. Electronically Signed   By: Misty Stanley M.D.   On: 06/24/2016 09:42    EKG:   Orders placed or performed during the hospital encounter of 10/27/15  . ED EKG  . ED EKG      Management plans discussed with the patient, family and they are in agreement.  CODE STATUS:     Code Status Orders        Start     Ordered    06/23/16 2212  Do not attempt resuscitation (DNR)  Continuous    Question Answer Comment  In the event of cardiac or respiratory ARREST Do not call a "code blue"   In the event of cardiac or respiratory ARREST Do not perform Intubation, CPR, defibrillation or ACLS   In the event of cardiac or respiratory ARREST Use medication by any route, position, wound care, and other measures to relive pain and suffering. May use oxygen, suction and manual treatment of airway obstruction as needed for comfort.      06/23/16 2211    Code Status History    Date Active Date Inactive Code Status Order  ID Comments User Context   06/08/2016  5:29 PM 06/11/2016  8:29 PM DNR [id number]  Demetrios Loll, MD Inpatient   10/27/2015  7:51 PM 10/31/2015  6:52 PM Full Code [id number]  Hillary Bow, MD ED      TOTAL TIME TAKING CARE OF THIS PATIENT: 40  minutes.    Theodoro Grist M.D on 06/25/2016 at 2:17 PM  Between 7am to 6pm - Pager - [phone]  After 6pm go to [email or web address] - password [email or web address] [facility]  [facility] [facility]  Office  [phone]  CC: Primary care physician; No PCP Per Patient

## 2016-06-25 NOTE — Progress Notes (Signed)
Dose of meropenem changed from 1 g IV q8h to 500 mg IV q24h based on renal function for intra-abdominal infection. Patient has a history of ESBL E.coli infection.  Cindi Carbon, PharmD 06/25/16 2:13 PM

## 2016-06-25 NOTE — Progress Notes (Signed)
New hospice home referral received from [place] following a Palliative medicine consult. Ms. Heather Zuniga is a 82 year old woman with a known history of stage III kidney disease, depression, GERD, anemia and arthritis admitted to [facility] on 11/15 for evaluation of acute on chronic renal failure with BUN 102, creatinine 7.99 and EGFR of 4. She also had an abnormally elevated alkaline phosphatase. Abdominal ultrasound showed a subcapsular liver lesion and moderate ascites. She has not improved despite medical interventions. This is her 2 admission this month. Patient and her daughter met with Palliative Medicine NP Ihor Dow to discuss goals of care and have chosen to focus on her comfort at the hospice home. Patient seen lying in bed, eyes closed, mumbling. Patient did awaken to her name, did state she and pain when asked, unable to state where. Per chart review she was able to take her oral medications this morning in yogurt. Foley catheter in place draining dark amber urine, patient is oliguric. Writer contacted patient's daughter Duane Lope ([phone]) to initiate education regarding hospice services, philosophy and team approach to care with good understanding voiced. Questions answered. She voiced agreement for transfer to the hospice home today and will plan to meet with the hospice home social worker at 2 pm today. Patient information faxed to hospice referral. Report called to the hospice home. EMS notified for transport. Hospital care team all aware of and in agreement with discharge plan. Signed portable DNR in place in patient's discharge packet.Thank you for the opportunity to be involved in the care of this patient and her family. Flo Shanks RN, BSN, [facility], hospital Liaison [phone] c

## 2016-06-25 NOTE — Care Management Important Message (Signed)
Important Message  Patient Details  Name: Heather Zuniga MRN: [id number] Date of Birth: 08-22-1922   Medicare Important Message Given:  N/A - LOS <3 / Initial given by admissions    Chapman Fitch, RN 06/25/2016, 9:56 AM

## 2016-06-25 NOTE — Clinical Social Work Note (Signed)
Clinical Social Work Assessment  Patient Details  Name: Heather Zuniga MRN: [id number] Date of Birth: 1922/12/20  Date of referral:  06/25/16               Reason for consult:  Facility Placement, End of Life/Hospice                Permission sought to share information with:    Permission granted to share information::     Name::        Agency::     Relationship::     Contact Information:     Housing/Transportation Living arrangements for the past 2 months:  Skilled Building surveyor of Information:  Adult Children Patient Interpreter Needed:  None Criminal Activity/Legal Involvement Pertinent to Current Situation/Hospitalization:  No - Comment as needed Significant Relationships:  Adult Children Lives with:  Facility Resident Do you feel safe going back to the place where you live?  Yes Need for family participation in patient care:  Yes (Comment)  Care giving concerns:  Patient has been at [facility] for short term rehab.    Social Worker assessment / plan:  CSW consulted to arrange hospice services either at [facility] or in hospice home. CSW contacted patient's daughter, Heather Zuniga: [phone], and explained role and purpose of phone call. Ms. Cliffton Asters did confirm that she had spoken with Palliative yesterday and had decided upon hospice services. CSW discussed this with Ms. White and she prefers patient receive care in the hospice home and prefers [place]. CSW explained the process to Ms. White and informed that Clydie Braun, with [facility], would be contacting her. No further questions by Ms. White. CSW has notified Clydie Braun with [facility] [address] Patient to discharge today to hospice home as bed is available.  Employment status:  Retired Health and safety inspector:  Medicare PT Recommendations:  Not assessed at this time Information / Referral to community resources:     Patient/Family's Response to care:  Patient's daughter expressed appreciation  for CSW assistance. Patient/Family's Understanding of and Emotional Response to Diagnosis, Current Treatment, and Prognosis:  Patient's daughter is aware of patient's prognosis and is accepting of this.   Emotional Assessment Appearance:  Appears stated age Attitude/Demeanor/Rapport:  Unable to Assess Affect (typically observed):  Calm Orientation:  Oriented to Self Alcohol / Substance use:  Not Applicable Psych involvement (Current and /or in the community):  No (Comment)  Discharge Needs  Concerns to be addressed:  Care Coordination Readmission within the last 30 days:  No Current discharge risk:  None Barriers to Discharge:  No Barriers Identified   York Spaniel, LCSW 06/25/2016, 10:48 AM

## 2016-06-25 NOTE — Progress Notes (Signed)
06/25/2016 1:54 PM  [name] to be D/C'd to [facility] per MD order.  Discussed prescriptions and follow up appointments with the patient. Prescriptions given to patient, medication list explained in detail. Pt verbalized understanding.    Vitals:   06/25/16 0518 06/25/16 1324  BP: (!) 89/53 (!) 99/51  Pulse: 73 74  Resp: 19 17  Temp: 97.6 F (36.4 C) 97.4 F (36.3 C)    Skin clean, dry and intact without evidence of skin break down, no evidence of skin tears noted. IV catheter discontinued intact. Site without signs and symptoms of complications. Dressing and pressure applied. Pt denies pain at this time. No complaints noted.  An After Visit Summary was printed and given to the patient. Patient escorted and D/C via EMS.  Bradly Chris

## 2016-07-09 DEATH — deceased

## 2017-03-05 IMAGING — US US RENAL
1 series · 14 of 25 positions shown · non-contrast
Comparison: None

CLINICAL DATA: Acute renal failure

EXAM:
RENAL / URINARY TRACT ULTRASOUND COMPLETE

[Series 1: us renal · 0.26mm/px · 14 of 51 slices shown]
[im 1/51]
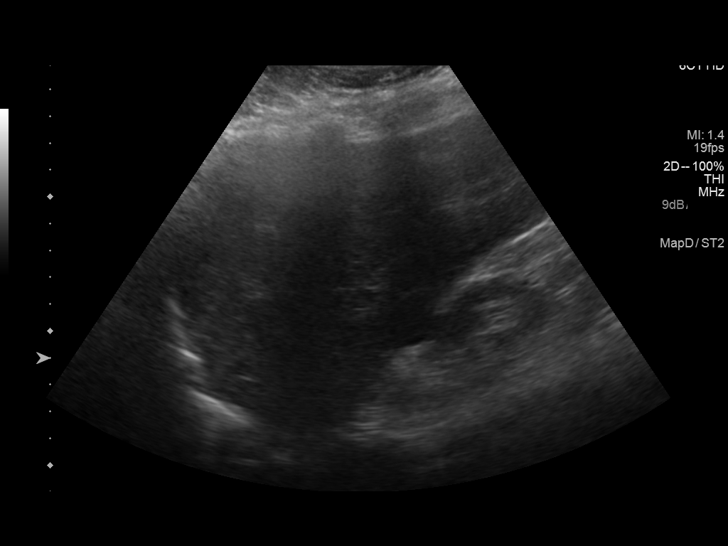
[im 5/51]
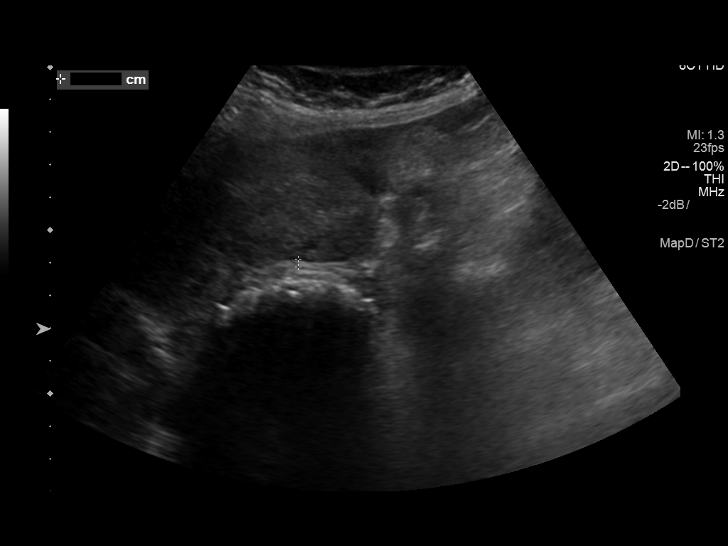
[im 9/51]
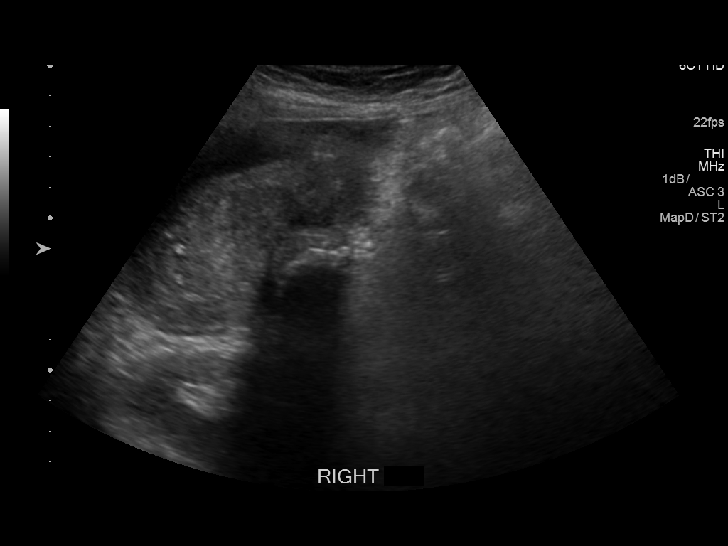
[im 13/51]
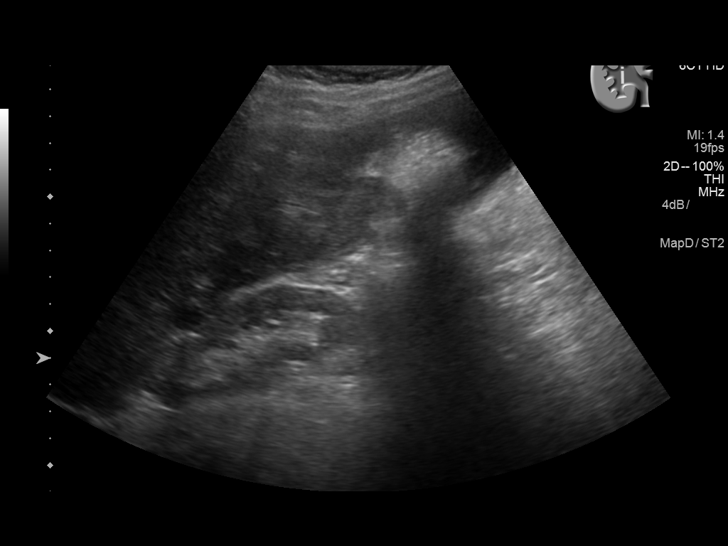
[im 17/51]
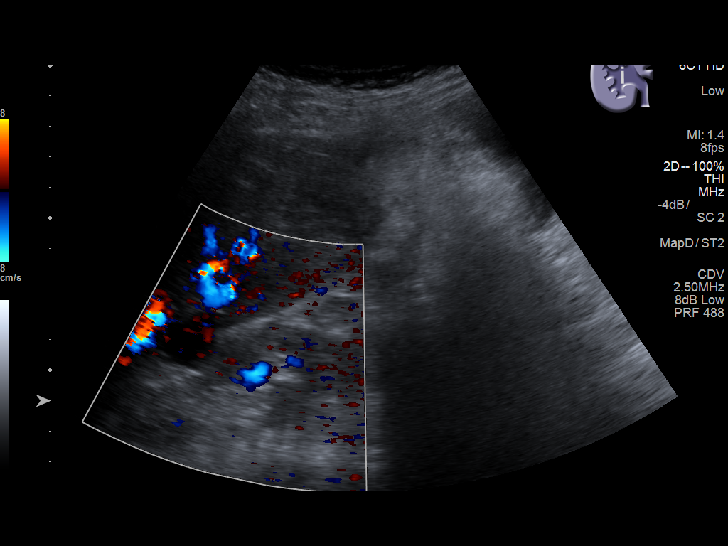
[im 19/51]
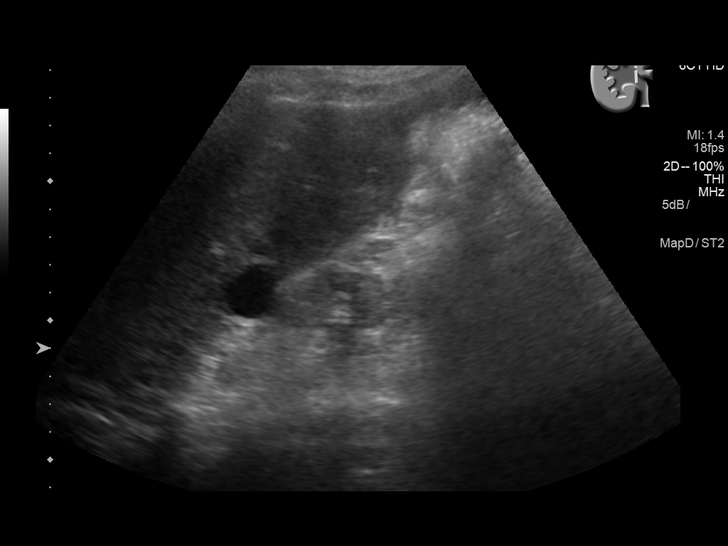
[im 23/51]
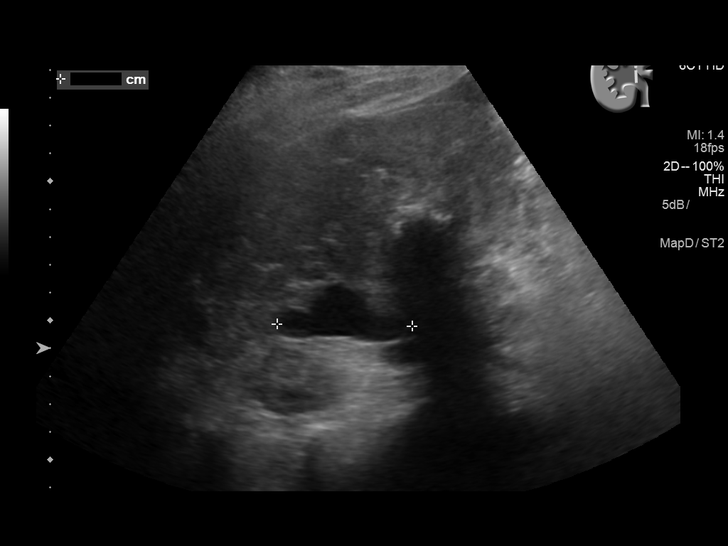
[im 28/51]
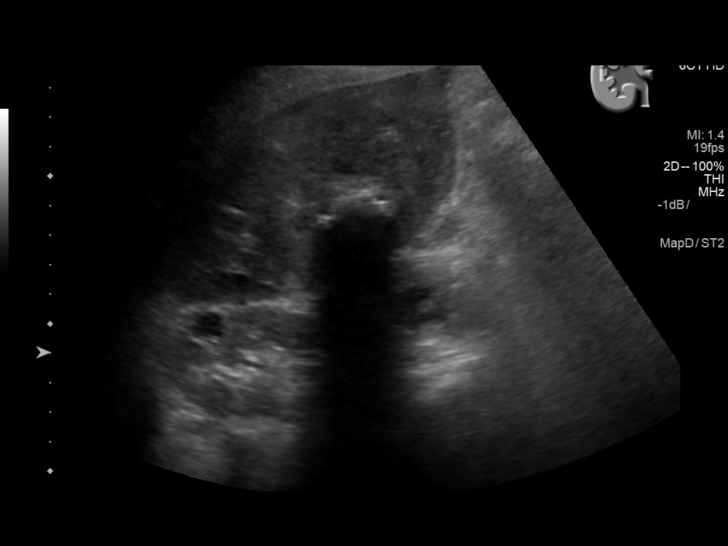
[im 32/51]
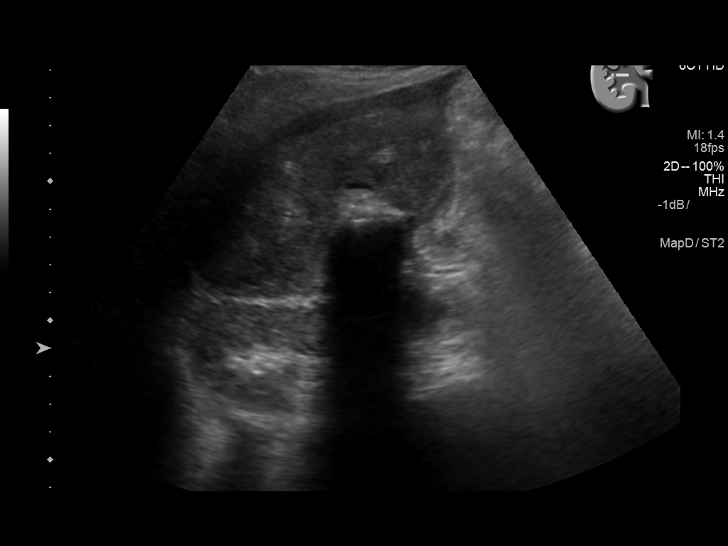
[im 34/51]
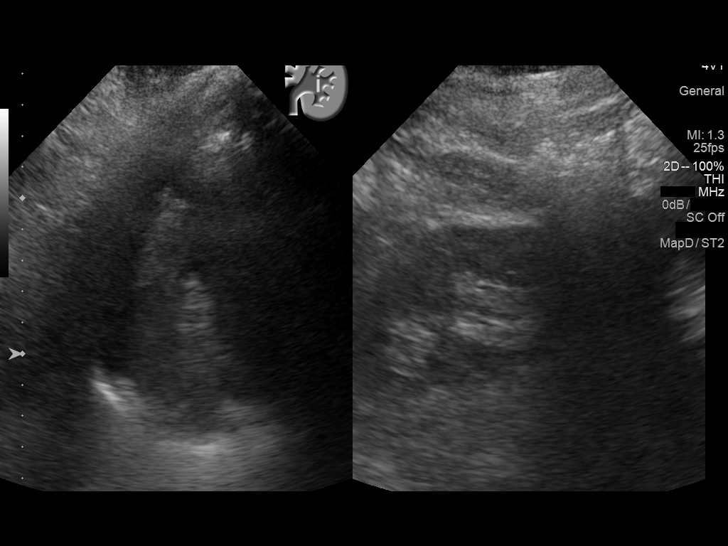
[im 38/51]
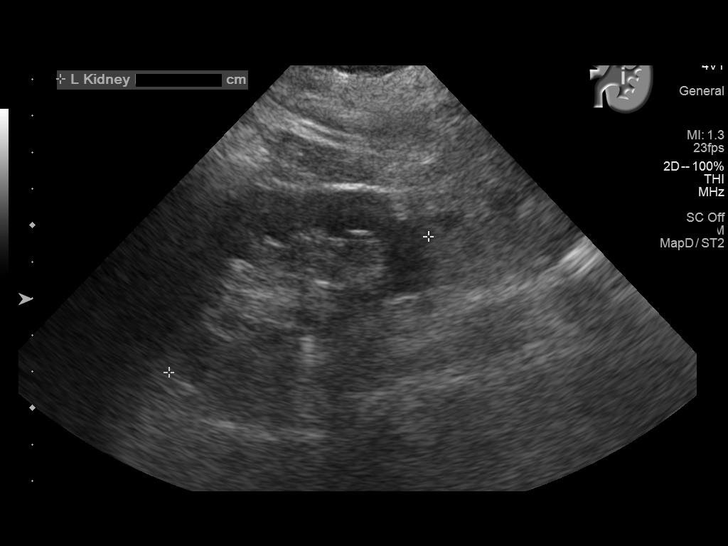
[im 42/51]
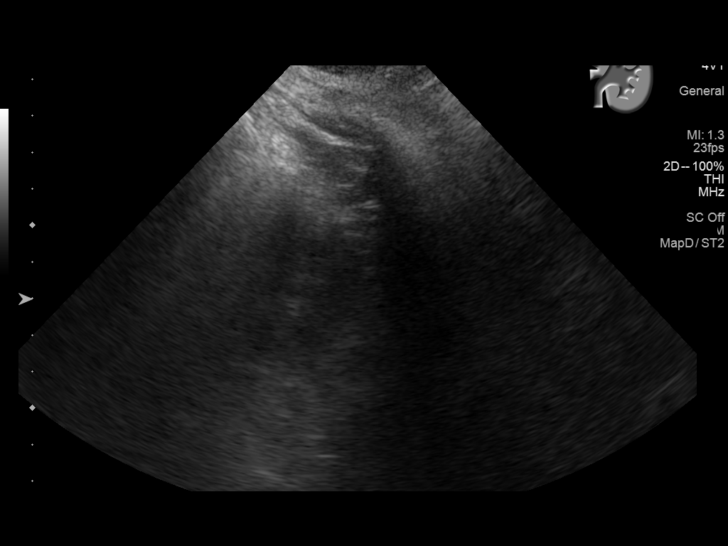
[im 46/51]
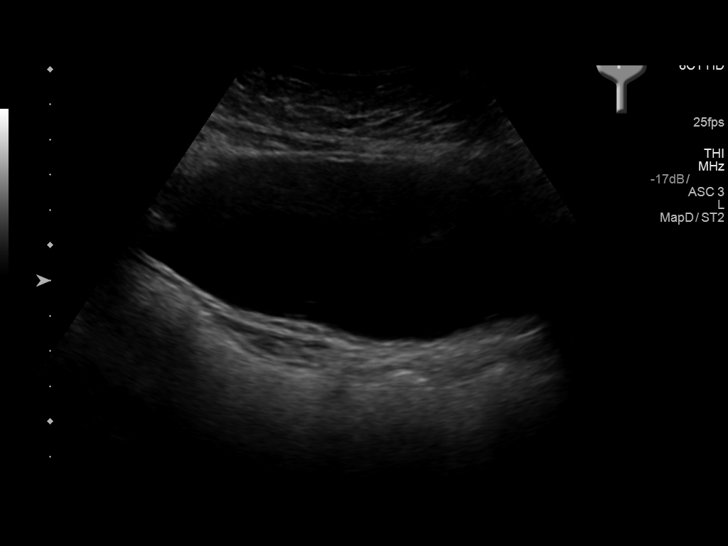
[im 51/51]
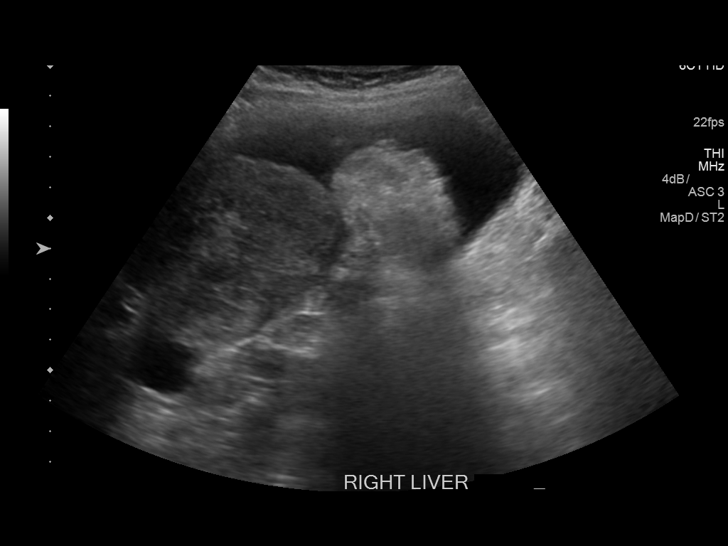

[14 of 25 positions shown; findings below may reference images not displayed]

FINDINGS: Right Kidney:

Length: 8.9 cm. Cortical thinning. Upper normal cortical
echogenicity. Small lobulated cyst medially at mid RIGHT kidney
x 1.7 x 4.9 cm. Additional tiny cyst lateral RIGHT kidney 11 x 10 x
11 mm. No additional mass, hydronephrosis, or shadowing
calcification

Left Kidney:

Length: 8.0 cm. Cortical thinning. Upper normal cortical
echogenicity. No mass, hydronephrosis, or shadowing calcification.

Bladder:

Appears normal for degree of bladder distention.

Mild to moderate lower abdominal ascites greater on RIGHT.

Gallbladder filled with multiple shadowing calculi.
IMPRESSION: Atrophic appearing kidneys with small RIGHT renal cyst.

Otherwise unremarkable renal ultrasound.

Mild to moderate lower abdominal ascites.

Cholelithiasis.

## 2018-01-30 IMAGING — DX DG TIBIA/FIBULA 2V*R*
3 series · 3 of 3 positions shown · non-contrast
Comparison: Limited correlation made with right hip radiographs
01/27/2013.

CLINICAL DATA: Recurrent cellulitis of both legs. Multiple
bilateral nonhealing wounds in the lower legs. Altered mental status
for 3 weeks.

EXAM:
RIGHT TIBIA AND FIBULA - 2 VIEW

[tibia ap (1 of 2)]
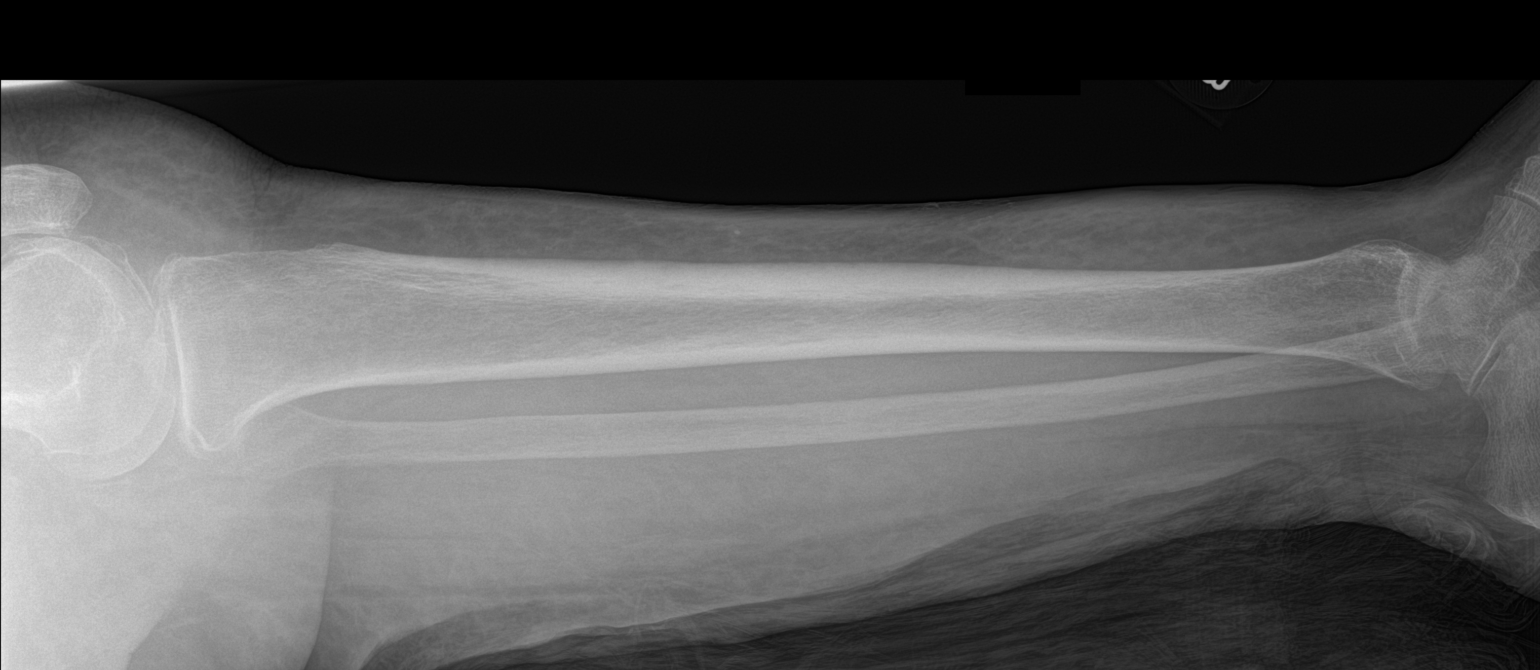

[tibia ap (2 of 2)]
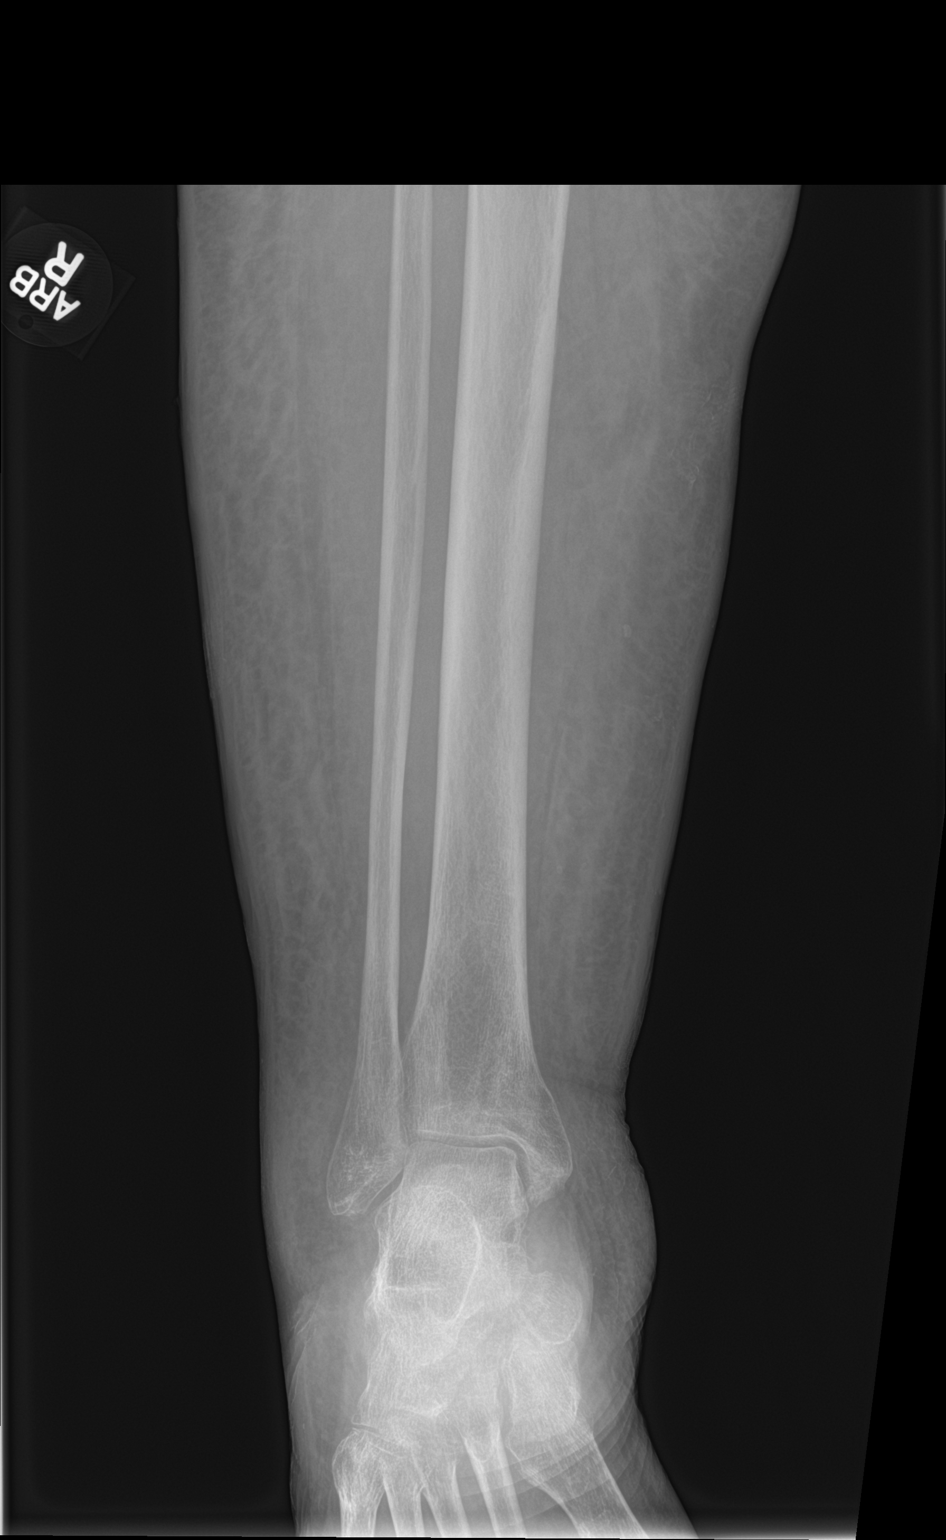

[tibia lat]
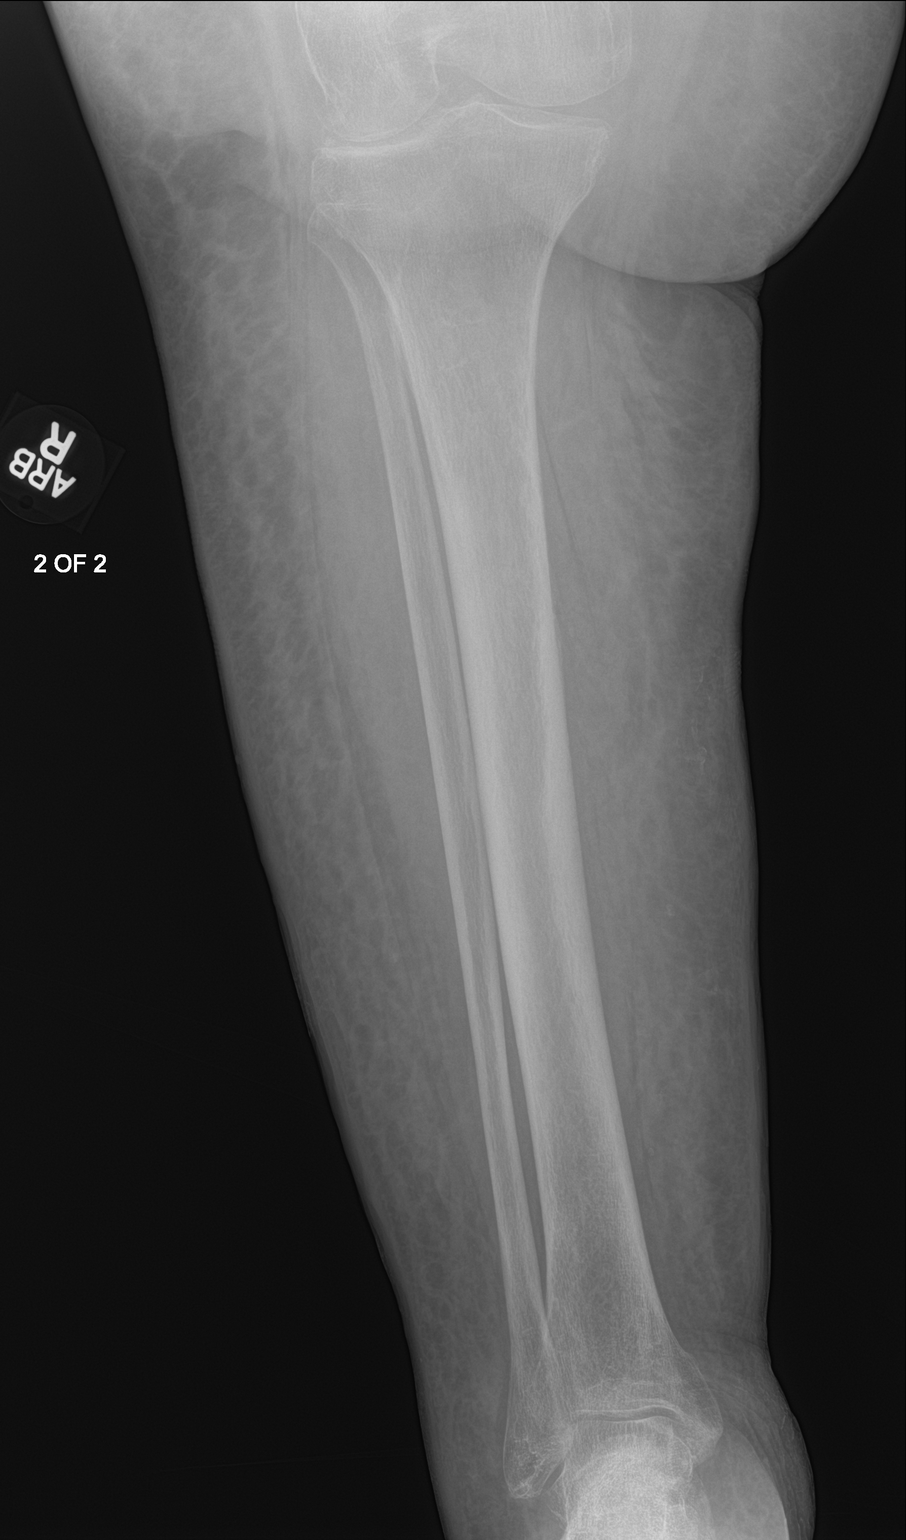

[3 of 3 positions shown; findings below may reference images not displayed]

FINDINGS: The bones are demineralized. There is generalized edema throughout
the right lower leg. No soft tissue emphysema or foreign body
visualized. No evidence of acute fracture, dislocation or bone
destruction. Chondrocalcinosis noted at the knee.
IMPRESSION: No acute osseous findings or evidence of osteomyelitis. Generalized
soft tissue edema consistent with the given history of cellulitis.
